# Patient Record
Sex: Female | Born: 1955 | Race: Black or African American | Hispanic: No | Smoking: Former smoker
Health system: Southern US, Community
[De-identification: ages and names within clinical notes are randomized; demographics above are authoritative.]

## PROBLEM LIST (undated history)

## (undated) DIAGNOSIS — E119 Type 2 diabetes mellitus without complications: Secondary | ICD-10-CM

## (undated) DIAGNOSIS — I10 Essential (primary) hypertension: Secondary | ICD-10-CM

## (undated) DIAGNOSIS — K529 Noninfective gastroenteritis and colitis, unspecified: Secondary | ICD-10-CM

## (undated) DIAGNOSIS — M1990 Unspecified osteoarthritis, unspecified site: Secondary | ICD-10-CM

## (undated) DIAGNOSIS — F419 Anxiety disorder, unspecified: Secondary | ICD-10-CM

## (undated) DIAGNOSIS — E785 Hyperlipidemia, unspecified: Secondary | ICD-10-CM

## (undated) DIAGNOSIS — T7840XA Allergy, unspecified, initial encounter: Secondary | ICD-10-CM

## (undated) HISTORY — DX: Unspecified osteoarthritis, unspecified site: M19.90

## (undated) HISTORY — DX: Anxiety disorder, unspecified: F41.9

## (undated) HISTORY — PX: FOOT SURGERY: SHX648

## (undated) HISTORY — DX: Noninfective gastroenteritis and colitis, unspecified: K52.9

## (undated) HISTORY — DX: Hyperlipidemia, unspecified: E78.5

## (undated) HISTORY — DX: Essential (primary) hypertension: I10

## (undated) HISTORY — DX: Allergy, unspecified, initial encounter: T78.40XA

## (undated) HISTORY — DX: Type 2 diabetes mellitus without complications: E11.9

---

## 1978-07-17 HISTORY — PX: KNEE ARTHROSCOPY: SUR90

## 1998-06-09 ENCOUNTER — Other Ambulatory Visit: Admission: RE | Admit: 1998-06-09 | Discharge: 1998-06-09 | Payer: Self-pay | Admitting: *Deleted

## 1998-08-20 ENCOUNTER — Encounter: Admission: RE | Admit: 1998-08-20 | Discharge: 1998-11-18 | Payer: Self-pay | Admitting: Internal Medicine

## 1999-07-18 HISTORY — PX: CERVIX SURGERY: SHX593

## 2000-05-30 ENCOUNTER — Other Ambulatory Visit: Admission: RE | Admit: 2000-05-30 | Discharge: 2000-05-30 | Payer: Self-pay | Admitting: *Deleted

## 2004-08-22 ENCOUNTER — Encounter: Payer: Self-pay | Admitting: Internal Medicine

## 2004-08-23 ENCOUNTER — Ambulatory Visit: Payer: Self-pay | Admitting: Internal Medicine

## 2004-09-28 ENCOUNTER — Ambulatory Visit: Payer: Self-pay | Admitting: Internal Medicine

## 2004-10-03 ENCOUNTER — Ambulatory Visit: Payer: Self-pay | Admitting: Internal Medicine

## 2004-10-06 ENCOUNTER — Ambulatory Visit: Payer: Self-pay | Admitting: Internal Medicine

## 2004-10-11 ENCOUNTER — Ambulatory Visit: Payer: Self-pay | Admitting: Internal Medicine

## 2004-12-14 ENCOUNTER — Ambulatory Visit: Payer: Self-pay | Admitting: Internal Medicine

## 2005-01-24 ENCOUNTER — Ambulatory Visit: Payer: Self-pay | Admitting: Internal Medicine

## 2005-03-13 ENCOUNTER — Ambulatory Visit: Payer: Self-pay | Admitting: Internal Medicine

## 2005-04-05 ENCOUNTER — Ambulatory Visit: Payer: Self-pay | Admitting: Internal Medicine

## 2005-07-17 HISTORY — PX: LAMINECTOMY: SHX219

## 2005-10-02 ENCOUNTER — Ambulatory Visit (HOSPITAL_COMMUNITY): Admission: RE | Admit: 2005-10-02 | Discharge: 2005-10-04 | Payer: Self-pay | Admitting: Orthopedic Surgery

## 2005-11-16 ENCOUNTER — Ambulatory Visit: Payer: Self-pay | Admitting: Internal Medicine

## 2006-03-01 ENCOUNTER — Encounter: Payer: Self-pay | Admitting: Vascular Surgery

## 2006-03-01 ENCOUNTER — Ambulatory Visit (HOSPITAL_COMMUNITY): Admission: RE | Admit: 2006-03-01 | Discharge: 2006-03-01 | Payer: Self-pay | Admitting: Orthopedic Surgery

## 2006-03-28 ENCOUNTER — Ambulatory Visit (HOSPITAL_COMMUNITY): Admission: RE | Admit: 2006-03-28 | Discharge: 2006-03-29 | Payer: Self-pay | Admitting: Orthopedic Surgery

## 2006-03-28 ENCOUNTER — Encounter (INDEPENDENT_AMBULATORY_CARE_PROVIDER_SITE_OTHER): Payer: Self-pay | Admitting: Specialist

## 2006-05-08 LAB — HM MAMMOGRAPHY: HM Mammogram: NORMAL

## 2006-06-29 ENCOUNTER — Ambulatory Visit (HOSPITAL_COMMUNITY): Admission: RE | Admit: 2006-06-29 | Discharge: 2006-06-29 | Payer: Self-pay | Admitting: Obstetrics and Gynecology

## 2006-06-29 ENCOUNTER — Encounter (INDEPENDENT_AMBULATORY_CARE_PROVIDER_SITE_OTHER): Payer: Self-pay | Admitting: Specialist

## 2006-11-21 DIAGNOSIS — E1151 Type 2 diabetes mellitus with diabetic peripheral angiopathy without gangrene: Secondary | ICD-10-CM

## 2006-11-21 DIAGNOSIS — E1169 Type 2 diabetes mellitus with other specified complication: Secondary | ICD-10-CM | POA: Insufficient documentation

## 2006-11-21 DIAGNOSIS — E785 Hyperlipidemia, unspecified: Secondary | ICD-10-CM

## 2006-11-21 DIAGNOSIS — K5289 Other specified noninfective gastroenteritis and colitis: Secondary | ICD-10-CM | POA: Insufficient documentation

## 2006-11-21 DIAGNOSIS — N1832 Chronic kidney disease, stage 3b: Secondary | ICD-10-CM | POA: Insufficient documentation

## 2006-11-22 ENCOUNTER — Ambulatory Visit: Payer: Self-pay | Admitting: Internal Medicine

## 2006-11-22 LAB — CONVERTED CEMR LAB
BUN: 17 mg/dL (ref 6–23)
Basophils Absolute: 0 10*3/uL (ref 0.0–0.1)
Bilirubin, Direct: 0.1 mg/dL (ref 0.0–0.3)
Calcium: 9.4 mg/dL (ref 8.4–10.5)
Chloride: 108 meq/L (ref 96–112)
Creatinine,U: 98.7 mg/dL
Eosinophils Relative: 3 % (ref 0.0–5.0)
GFR calc Af Amer: 85 mL/min
GFR calc non Af Amer: 70 mL/min
Glucose, Bld: 79 mg/dL (ref 70–99)
HCT: 41.4 % (ref 36.0–46.0)
HDL: 33.4 mg/dL — ABNORMAL LOW (ref >39.0)
Hgb A1c MFr Bld: 4.9 % (ref 4.6–6.0)
Lymphocytes Relative: 27.2 % (ref 12.0–46.0)
MCHC: 33.8 g/dL (ref 30.0–36.0)
Microalb Creat Ratio: 3 mg/g (ref 0.0–30.0)
Monocytes Absolute: 0.6 10*3/uL (ref 0.2–0.7)
Neutro Abs: 5.9 10*3/uL (ref 1.4–7.7)
Neutrophils Relative %: 63.5 % (ref 43.0–77.0)
Sodium: 141 meq/L (ref 135–145)
Total CHOL/HDL Ratio: 4.3
Total Protein: 7.7 g/dL (ref 6.0–8.3)
Triglycerides: 69 mg/dL (ref 0–149)

## 2007-01-15 ENCOUNTER — Encounter: Payer: Self-pay | Admitting: Internal Medicine

## 2007-03-23 IMAGING — CR DG LUMBAR SPINE 2-3V
2 series · 2 of 2 positions shown · non-contrast
Comparison: none

CLINICAL DATA: Back pain.
 LUMBAR SPINE ? 1 VIEW:

[view not recorded (1 of 2)]
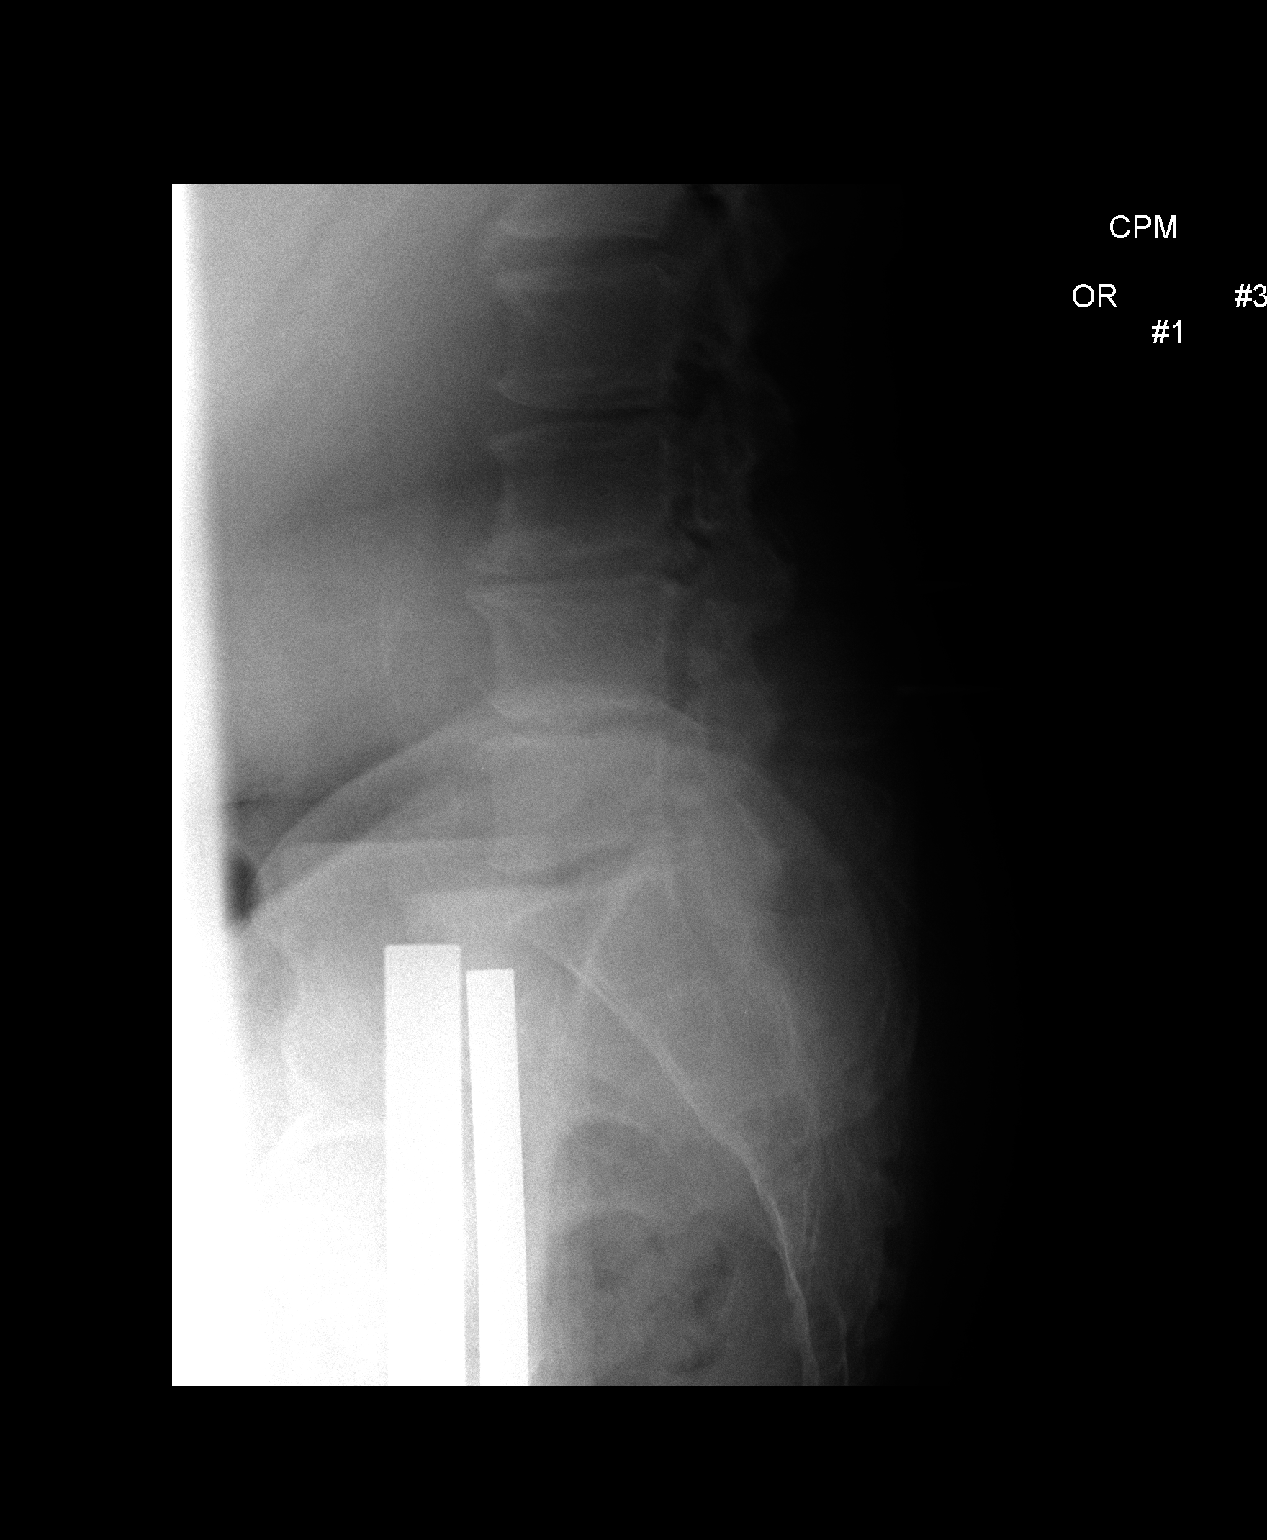

[view not recorded (2 of 2)]
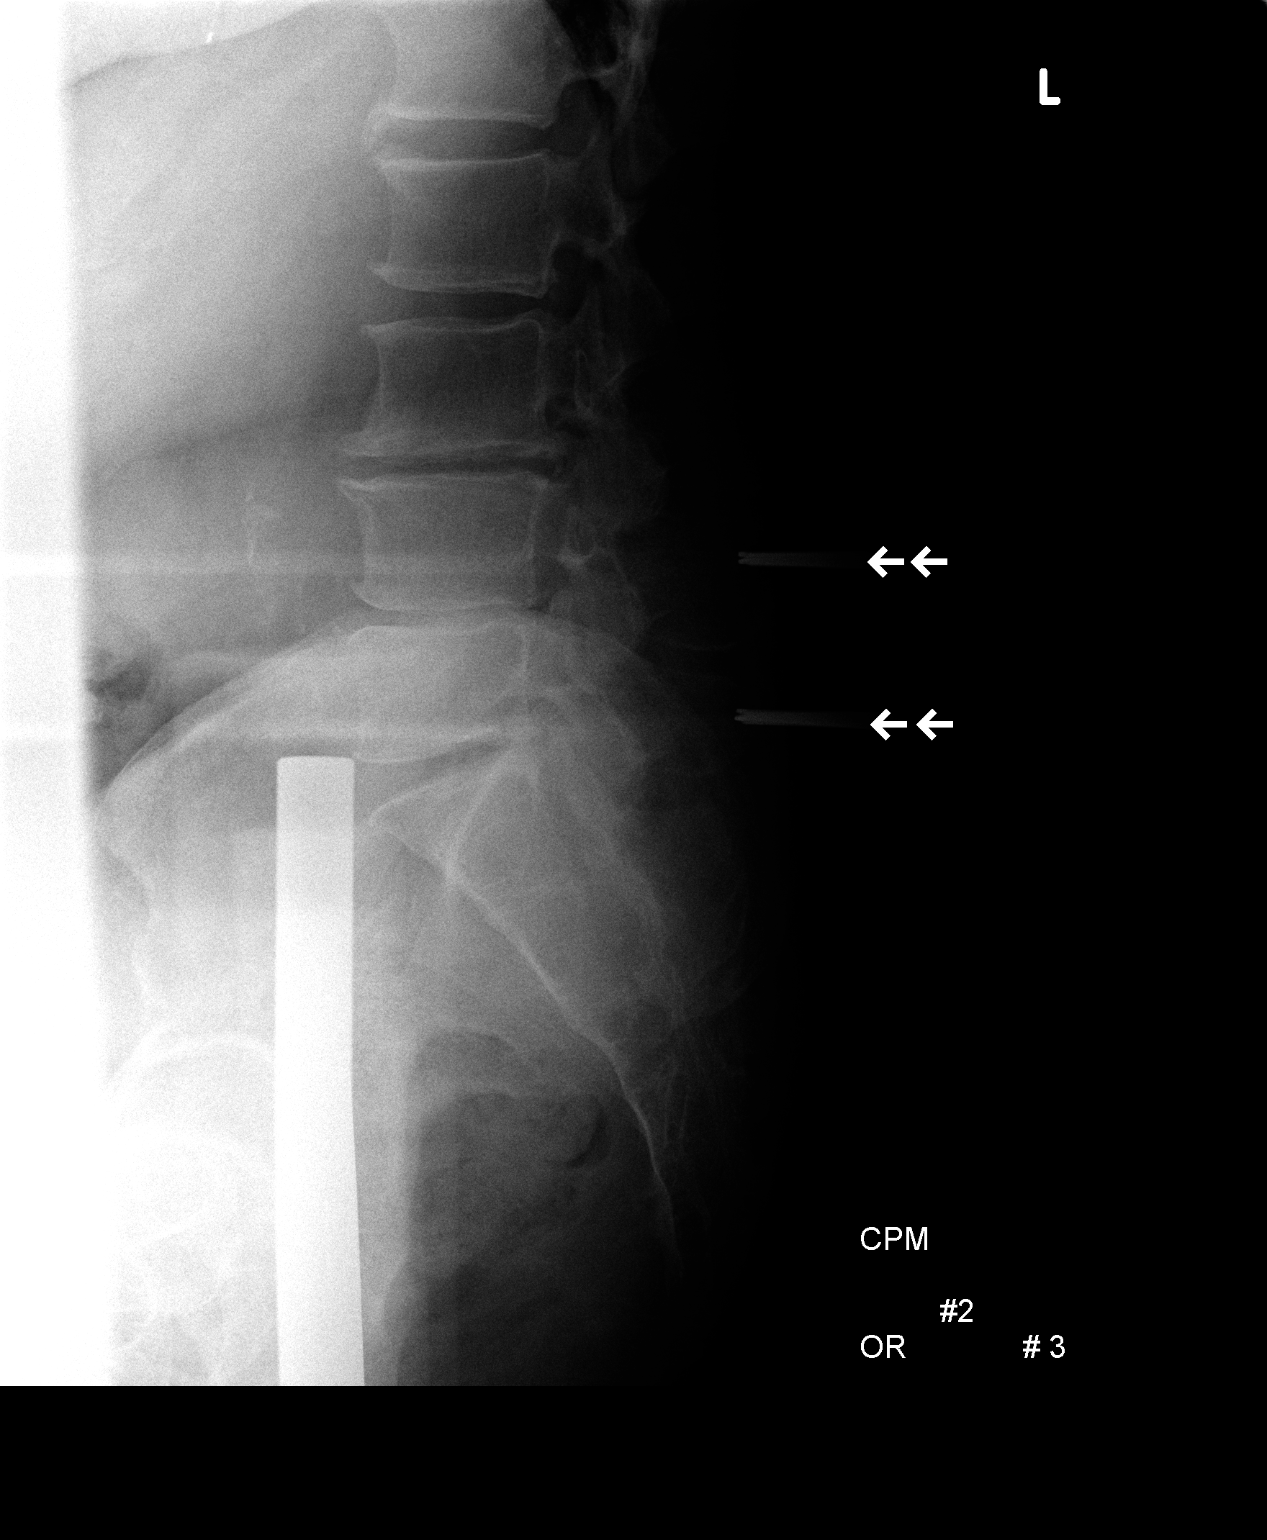

[2 of 2 positions shown; findings below may reference images not displayed]

FINDINGS: Intraoperative lateral film #1 at [DATE] hours demonstrates multilevel degenerative change.  No definite instrument is seen in the same field.
IMPRESSION: As discussed above.
 LUMBAR SPINE ? 1 VIEW:
FINDINGS: Film #2 at [DATE] hours demonstrates a probe likely representing a towel clamp directed at the L-4 and L-5 spinous processes.
IMPRESSION: L-4 and L-5 marked.

## 2007-06-14 ENCOUNTER — Encounter: Payer: Self-pay | Admitting: Internal Medicine

## 2007-06-21 ENCOUNTER — Telehealth (INDEPENDENT_AMBULATORY_CARE_PROVIDER_SITE_OTHER): Payer: Self-pay | Admitting: *Deleted

## 2007-07-18 HISTORY — PX: COLONOSCOPY: SHX174

## 2007-09-18 ENCOUNTER — Encounter: Payer: Self-pay | Admitting: Internal Medicine

## 2007-09-24 ENCOUNTER — Ambulatory Visit: Payer: Self-pay | Admitting: Internal Medicine

## 2007-09-27 ENCOUNTER — Telehealth (INDEPENDENT_AMBULATORY_CARE_PROVIDER_SITE_OTHER): Payer: Self-pay | Admitting: *Deleted

## 2007-10-04 ENCOUNTER — Encounter: Payer: Self-pay | Admitting: Internal Medicine

## 2007-10-04 ENCOUNTER — Ambulatory Visit: Payer: Self-pay | Admitting: Internal Medicine

## 2007-10-04 LAB — HM COLONOSCOPY: HM Colonoscopy: NEGATIVE

## 2008-01-20 ENCOUNTER — Telehealth (INDEPENDENT_AMBULATORY_CARE_PROVIDER_SITE_OTHER): Payer: Self-pay | Admitting: *Deleted

## 2008-01-22 ENCOUNTER — Telehealth (INDEPENDENT_AMBULATORY_CARE_PROVIDER_SITE_OTHER): Payer: Self-pay | Admitting: *Deleted

## 2008-01-27 ENCOUNTER — Telehealth (INDEPENDENT_AMBULATORY_CARE_PROVIDER_SITE_OTHER): Payer: Self-pay | Admitting: *Deleted

## 2008-04-22 ENCOUNTER — Ambulatory Visit: Payer: Self-pay | Admitting: Internal Medicine

## 2008-04-22 DIAGNOSIS — R9431 Abnormal electrocardiogram [ECG] [EKG]: Secondary | ICD-10-CM

## 2008-04-23 ENCOUNTER — Telehealth (INDEPENDENT_AMBULATORY_CARE_PROVIDER_SITE_OTHER): Payer: Self-pay | Admitting: *Deleted

## 2008-04-24 ENCOUNTER — Encounter (INDEPENDENT_AMBULATORY_CARE_PROVIDER_SITE_OTHER): Payer: Self-pay | Admitting: *Deleted

## 2008-04-30 ENCOUNTER — Encounter: Payer: Self-pay | Admitting: Internal Medicine

## 2008-04-30 ENCOUNTER — Telehealth (INDEPENDENT_AMBULATORY_CARE_PROVIDER_SITE_OTHER): Payer: Self-pay | Admitting: *Deleted

## 2008-04-30 ENCOUNTER — Encounter (INDEPENDENT_AMBULATORY_CARE_PROVIDER_SITE_OTHER): Payer: Self-pay | Admitting: *Deleted

## 2008-04-30 ENCOUNTER — Ambulatory Visit: Payer: Self-pay

## 2008-05-08 ENCOUNTER — Telehealth (INDEPENDENT_AMBULATORY_CARE_PROVIDER_SITE_OTHER): Payer: Self-pay | Admitting: *Deleted

## 2008-05-11 ENCOUNTER — Telehealth (INDEPENDENT_AMBULATORY_CARE_PROVIDER_SITE_OTHER): Payer: Self-pay | Admitting: *Deleted

## 2008-05-25 ENCOUNTER — Encounter: Payer: Self-pay | Admitting: Internal Medicine

## 2008-06-22 ENCOUNTER — Telehealth (INDEPENDENT_AMBULATORY_CARE_PROVIDER_SITE_OTHER): Payer: Self-pay | Admitting: *Deleted

## 2008-06-24 ENCOUNTER — Ambulatory Visit: Payer: Self-pay | Admitting: Internal Medicine

## 2008-06-25 ENCOUNTER — Encounter (INDEPENDENT_AMBULATORY_CARE_PROVIDER_SITE_OTHER): Payer: Self-pay | Admitting: *Deleted

## 2008-06-25 LAB — CONVERTED CEMR LAB: Hgb A1c MFr Bld: 6.5 % — ABNORMAL HIGH (ref 4.6–6.0)

## 2008-09-15 ENCOUNTER — Encounter (INDEPENDENT_AMBULATORY_CARE_PROVIDER_SITE_OTHER): Payer: Self-pay | Admitting: *Deleted

## 2008-09-24 ENCOUNTER — Telehealth (INDEPENDENT_AMBULATORY_CARE_PROVIDER_SITE_OTHER): Payer: Self-pay | Admitting: *Deleted

## 2008-10-06 ENCOUNTER — Encounter: Payer: Self-pay | Admitting: Internal Medicine

## 2008-12-09 ENCOUNTER — Ambulatory Visit: Payer: Self-pay | Admitting: Internal Medicine

## 2008-12-09 DIAGNOSIS — R1013 Epigastric pain: Secondary | ICD-10-CM

## 2008-12-15 ENCOUNTER — Encounter (INDEPENDENT_AMBULATORY_CARE_PROVIDER_SITE_OTHER): Payer: Self-pay | Admitting: *Deleted

## 2008-12-15 LAB — CONVERTED CEMR LAB
ALT: 22 units/L (ref 0–35)
Albumin: 4 g/dL (ref 3.5–5.2)
Basophils Absolute: 0 10*3/uL (ref 0.0–0.1)
Basophils Relative: 0.1 % (ref 0.0–3.0)
H Pylori IgG: NEGATIVE
HCT: 43.5 % (ref 36.0–46.0)
Hemoglobin: 14.8 g/dL (ref 12.0–15.0)
MCHC: 34.1 g/dL (ref 30.0–36.0)
Monocytes Absolute: 0.6 10*3/uL (ref 0.1–1.0)
Monocytes Relative: 4.8 % (ref 3.0–12.0)
Neutrophils Relative %: 68.9 % (ref 43.0–77.0)
RBC: 4.64 M/uL (ref 3.87–5.11)
Total Bilirubin: 0.9 mg/dL (ref 0.3–1.2)
Total Protein: 7.8 g/dL (ref 6.0–8.3)
WBC: 11.5 10*3/uL — ABNORMAL HIGH (ref 4.5–10.5)

## 2008-12-21 ENCOUNTER — Encounter (INDEPENDENT_AMBULATORY_CARE_PROVIDER_SITE_OTHER): Payer: Self-pay | Admitting: *Deleted

## 2008-12-21 ENCOUNTER — Ambulatory Visit: Payer: Self-pay | Admitting: Internal Medicine

## 2008-12-21 LAB — CONVERTED CEMR LAB
OCCULT 1: NEGATIVE
OCCULT 2: NEGATIVE
OCCULT 3: NEGATIVE

## 2009-01-12 ENCOUNTER — Telehealth (INDEPENDENT_AMBULATORY_CARE_PROVIDER_SITE_OTHER): Payer: Self-pay | Admitting: *Deleted

## 2009-02-08 ENCOUNTER — Ambulatory Visit: Payer: Self-pay | Admitting: Internal Medicine

## 2009-02-12 ENCOUNTER — Ambulatory Visit (HOSPITAL_COMMUNITY): Admission: RE | Admit: 2009-02-12 | Discharge: 2009-02-12 | Payer: Self-pay | Admitting: Internal Medicine

## 2009-02-14 ENCOUNTER — Encounter: Payer: Self-pay | Admitting: Internal Medicine

## 2009-03-08 ENCOUNTER — Telehealth (INDEPENDENT_AMBULATORY_CARE_PROVIDER_SITE_OTHER): Payer: Self-pay | Admitting: *Deleted

## 2009-05-12 ENCOUNTER — Ambulatory Visit: Payer: Self-pay | Admitting: Internal Medicine

## 2009-06-29 ENCOUNTER — Telehealth (INDEPENDENT_AMBULATORY_CARE_PROVIDER_SITE_OTHER): Payer: Self-pay | Admitting: *Deleted

## 2009-06-30 ENCOUNTER — Telehealth (INDEPENDENT_AMBULATORY_CARE_PROVIDER_SITE_OTHER): Payer: Self-pay | Admitting: *Deleted

## 2009-08-30 ENCOUNTER — Encounter (INDEPENDENT_AMBULATORY_CARE_PROVIDER_SITE_OTHER): Payer: Self-pay | Admitting: *Deleted

## 2009-12-08 ENCOUNTER — Telehealth (INDEPENDENT_AMBULATORY_CARE_PROVIDER_SITE_OTHER): Payer: Self-pay | Admitting: *Deleted

## 2010-02-14 ENCOUNTER — Ambulatory Visit: Payer: Self-pay | Admitting: Internal Medicine

## 2010-02-14 DIAGNOSIS — I152 Hypertension secondary to endocrine disorders: Secondary | ICD-10-CM | POA: Insufficient documentation

## 2010-02-14 DIAGNOSIS — I10 Essential (primary) hypertension: Secondary | ICD-10-CM | POA: Insufficient documentation

## 2010-04-18 ENCOUNTER — Telehealth (INDEPENDENT_AMBULATORY_CARE_PROVIDER_SITE_OTHER): Payer: Self-pay | Admitting: *Deleted

## 2010-05-23 ENCOUNTER — Telehealth (INDEPENDENT_AMBULATORY_CARE_PROVIDER_SITE_OTHER): Payer: Self-pay | Admitting: *Deleted

## 2010-06-01 ENCOUNTER — Ambulatory Visit: Payer: Self-pay | Admitting: Internal Medicine

## 2010-06-06 LAB — CONVERTED CEMR LAB
Cholesterol: 193 mg/dL (ref 0–200)
HDL: 40.7 mg/dL (ref >39.00)
Triglycerides: 70 mg/dL (ref 0.0–149.0)

## 2010-07-04 ENCOUNTER — Ambulatory Visit: Payer: Self-pay | Admitting: Internal Medicine

## 2010-08-14 LAB — CONVERTED CEMR LAB
ALT: 24 units/L (ref 0–35)
ALT: 55 units/L — ABNORMAL HIGH (ref 0–35)
AST: 33 units/L (ref 0–37)
AST: 74 units/L — ABNORMAL HIGH (ref 0–37)
Albumin: 4.3 g/dL (ref 3.5–5.2)
Alkaline Phosphatase: 52 units/L (ref 39–117)
Basophils Absolute: 0 10*3/uL (ref 0.0–0.1)
Basophils Absolute: 0.1 10*3/uL (ref 0.0–0.1)
Basophils Relative: 0.6 % (ref 0.0–3.0)
Bilirubin, Direct: 0.1 mg/dL (ref 0.0–0.3)
Bilirubin, Direct: 0.2 mg/dL (ref 0.0–0.3)
CO2: 26 meq/L (ref 19–32)
Calcium: 9.4 mg/dL (ref 8.4–10.5)
Calcium: 9.5 mg/dL (ref 8.4–10.5)
Chloride: 108 meq/L (ref 96–112)
Cholesterol: 210 mg/dL — ABNORMAL HIGH (ref 0–200)
Creatinine, Ser: 0.9 mg/dL (ref 0.4–1.2)
Creatinine, Ser: 1.1 mg/dL (ref 0.4–1.2)
Direct LDL: 149.9 mg/dL
GFR calc Af Amer: 85 mL/min
GFR calc non Af Amer: 70 mL/min
GFR calc non Af Amer: 70.2 mL/min (ref >60)
HCT: 42.4 % (ref 36.0–46.0)
HCT: 43.8 % (ref 36.0–46.0)
HDL goal, serum: 40 mg/dL
HDL: 43.7 mg/dL (ref >39.00)
Hgb A1c MFr Bld: 7.4 % — ABNORMAL HIGH (ref 4.6–6.0)
LDL Cholesterol: 111 mg/dL — ABNORMAL HIGH (ref 0–99)
Lymphocytes Relative: 26.4 % (ref 12.0–46.0)
MCHC: 34 g/dL (ref 30.0–36.0)
MCV: 93.2 fL (ref 78.0–100.0)
MCV: 94.3 fL (ref 78.0–100.0)
Microalb Creat Ratio: 0.4 mg/g (ref 0.0–30.0)
Microalb, Ur: 1 mg/dL (ref 0.0–1.9)
Monocytes Absolute: 0.6 10*3/uL (ref 0.1–1.0)
Neutro Abs: 7.4 10*3/uL (ref 1.4–7.7)
Neutrophils Relative %: 64.4 % (ref 43.0–77.0)
Neutrophils Relative %: 64.5 % (ref 43.0–77.0)
Platelets: 163 10*3/uL (ref 150–400)
RBC: 4.7 M/uL (ref 3.87–5.11)
RDW: 11.7 % (ref 11.5–14.6)
RDW: 13.2 % (ref 11.5–14.6)
TSH: 1.63 microintl units/mL (ref 0.35–5.50)
Total Bilirubin: 0.8 mg/dL (ref 0.3–1.2)
Total Bilirubin: 0.8 mg/dL (ref 0.3–1.2)
Total Protein: 7.6 g/dL (ref 6.0–8.3)
Triglycerides: 87 mg/dL (ref 0–149)
Triglycerides: 91 mg/dL (ref 0.0–149.0)
WBC: 9.8 10*3/uL (ref 4.5–10.5)

## 2010-08-18 NOTE — Assessment & Plan Note (Signed)
Summary: ov before refilling med/cbs   Vital Signs:  Patient profile:   55 year old female Weight:      255.4 pounds BMI:     38.13 Pulse rate:   80 / minute Resp:     16 per minute BP sitting:   136 / 80  (left arm) Cuff size:   large  Vitals Entered By: Georgette Dover CMA (July 04, 2010 3:22 PM) CC: Follow-up visit: discuss labs (copy given) and shingles vaccine , Type 2 diabetes mellitus follow-up   Primary Care Kinneth Fujiwara:  Unice Cobble, MD  CC:  Follow-up visit: discuss labs (copy given) and shingles vaccine  and Type 2 diabetes mellitus follow-up.  History of Present Illness:   Type 2 Diabetes Mellitus Follow-Up:   The patient denies polyuria, polydipsia, blurred vision, self managed hypoglycemia, weight loss,  or weight gain. Her  numbness of extremities has been present since spine surgery in 2007.  The patient denies the following symptoms: neuropathic pain, chest pain, vomiting, orthostatic symptoms, poor wound healing, intermittent claudication, and vision loss.  Since the last visit the patient reports good dietary compliance and exercising regularly.  The patient has been measuring capillary blood glucose before breakfast, < 139.  Since the last visit, the patient reports having had eye care by an ophthalmologist; no retinopathy present.   A1c 6.9% ( average sugar 151, risk 38%). Potential risks of Actos discussed.   Hyperlipidemia Follow-Up:    The patient denies muscle aches, GI upset, abdominal pain, flushing, itching, constipation, diarrhea, and fatigue.  The patient denies the following symptoms: dypsnea, palpitations, syncope, and pedal edema.  Compliance with medications (by patient report) has been near 100%.  Adjunctive measures currently used by the patient include ASA . LDL 138 on Simvastatin 40 mg, riisk discussed.Lipitor had cost her $50/month.  Current Medications (verified): 1)  Metoprolol Tartrate 50 Mg Tabs (Metoprolol Tartrate) .... Take 1/2 Tablet Twice A  Day. 2)  Simvastatin 40 Mg Tabs (Simvastatin) .Marland Kitchen.. 1 By Mouth Once Daily**appointment Due** 3)  Benazepril Hcl 40 Mg Tabs (Benazepril Hcl) .... Take One Tablet Daily 4)  Metformin Hcl 1000 Mg Tabs (Metformin Hcl) .Marland Kitchen.. 1 By Mouth Two Times A Day 5)  Hydrochlorothiazide 25 Mg Tabs (Hydrochlorothiazide) .... Take 1/2 Tablet Daily By Mouth 6)  Fem Hrt 1.5 .... Take As Directed 7)  Onetouch Delica Lancets  Misc (Lancets) .... Check Bloodsugar Daily Dx:250.00 8)  Freestyle Test   Strp (Glucose Blood) .... Use As Directed 9)  Actos 30 Mg Tabs (Pioglitazone Hcl) .Marland Kitchen.. 1 Once Daily 10)  Onetouch Ultra Test  Strp (Glucose Blood) .... Check Bloodsugar Once Daily Dx:250.00 11)  Aspirin 81 Mg Tabs (Aspirin) .Marland Kitchen.. 1 By Mouth Once Daily  Allergies: 1)  ! Codeine 2)  ! Septra Ds 3)  ! Neosporin  Physical Exam  General:  well-nourished;alert,appropriate and cooperative throughout examination Lungs:  Normal respiratory effort, chest expands symmetrically. Lungs are clear to auscultation, no crackles or wheezes. Heart:  Normal rate and regular rhythm. S1 and S2 normal without gallop, murmur, click, rub .S4 Pulses:  R and L carotid,radial,dorsalis pedis and posterior tibial pulses are full and equal bilaterally Extremities:  No clubbing, cyanosis, edema. Good nail health Neurologic:  alert & oriented X3 and  light touch felt as tingling R foot.   Skin:  Intact without suspicious lesions or rashes Psych:  memory intact for recent and remote, normally interactive, and good eye contact.     Impression & Recommendations:  Problem #  1:  DIABETES MELLITUS, TYPE II (ICD-250.00) A1c climbing  The following medications were removed from the medication list:    Amaryl 4 Mg Tabs (Glimepiride) .Marland Kitchen... Take 1/2 tablet once a day by mouth (held due to a1c 5.8%)    Metformin Hcl 1000 Mg Tabs (Metformin hcl) .Marland Kitchen... 1 by mouth two times a day    Actos 30 Mg Tabs (Pioglitazone hcl) .Marland Kitchen... 1 once daily Her updated  medication list for this problem includes:    Benazepril Hcl 40 Mg Tabs (Benazepril hcl) .Marland Kitchen... Take one tablet daily    Aspirin 81 Mg Tabs (Aspirin) .Marland Kitchen... 1 by mouth once daily    Janumet 50-1000 Mg Tabs (Sitagliptin-metformin hcl) .Marland Kitchen... 1 two times a day with meals  Problem # 2:  HYPERLIPIDEMIA (ICD-272.4)  Her updated medication list for this problem includes:    Simvastatin 40 Mg Tabs (Simvastatin) .Marland Kitchen... 1 by mouth once daily  Problem # 3:  HYPERTENSION (ICD-401.9) good control Her updated medication list for this problem includes:    Metoprolol Tartrate 50 Mg Tabs (Metoprolol tartrate) .Marland Kitchen... Take 1/2 tablet twice a day.    Benazepril Hcl 40 Mg Tabs (Benazepril hcl) .Marland Kitchen... Take one tablet daily    Hydrochlorothiazide 25 Mg Tabs (Hydrochlorothiazide) .Marland Kitchen... Take 1/2 tablet daily by mouth  Complete Medication List: 1)  Metoprolol Tartrate 50 Mg Tabs (Metoprolol tartrate) .... Take 1/2 tablet twice a day. 2)  Benazepril Hcl 40 Mg Tabs (Benazepril hcl) .... Take one tablet daily 3)  Hydrochlorothiazide 25 Mg Tabs (Hydrochlorothiazide) .... Take 1/2 tablet daily by mouth 4)  Fem Hrt 1.5  .... Take as directed 5)  Onetouch Delica Lancets Misc (Lancets) .... Check bloodsugar daily dx:250.00 6)  Freestyle Test Strp (Glucose blood) .... Use as directed 7)  Onetouch Ultra Test Strp (Glucose blood) .... Check bloodsugar once daily dx:250.00 8)  Aspirin 81 Mg Tabs (Aspirin) .Marland Kitchen.. 1 by mouth once daily 9)  Janumet 50-1000 Mg Tabs (Sitagliptin-metformin hcl) .Marland Kitchen.. 1 two times a day with meals 10)  Lipitor 40 Mg Tabs (Atorvastatin calcium) .Marland Kitchen.. 1 once daily  Other Orders: Zoster (Shingles) Vaccine Live [phone]) Admin 1st Vaccine [phone])  Patient Instructions: 1)  Please schedule a follow-up appointment in 4 months. 2)  Check your blood sugars regularly. If your readings are usually above : 150 or below 90 you should contact our office. 3)  See your eye doctor yearly to check for diabetic eye  damage. 4)  Check your feet each night for sore areas, calluses or signs of infection. 5)  BUN,creat, K+; 6)  Hepatic Panel ; 7)  Lipid Panel ; 8)  HbgA1C ; 9)  Urine Microalbumin. 10)  Check your Blood Pressure regularly. If it is above: 135/85 ON AVERAGE  you should make an appointment. Prescriptions: LIPITOR 40 MG TABS (ATORVASTATIN CALCIUM) 1 once daily  #30 x 5   Entered and Authorized by:   Unice Cobble MD   Signed by:   Unice Cobble MD on 07/04/2010   Method used:   Print then Give to Patient   RxID:   [id number] JANUMET 50-1000 MG TABS (SITAGLIPTIN-METFORMIN HCL) 1 two times a day with meals  #60 x 5   Entered and Authorized by:   Unice Cobble MD   Signed by:   Unice Cobble MD on 07/04/2010   Method used:   Print then Give to Patient   RxID:   [id number]    Orders Added: 1)  Zoster (Shingles) Vaccine Live [41324] 2)  Admin  1st Vaccine [90471] 3)  Est. Patient Level IV [69249]   Immunizations Administered:  Zostavax # 1:    Vaccine Type: Zostavax    Site: Left Arm    Mfr: Merck    Dose: 0.66m    Route: [place]    Given by: CGeorgette DoverCMA    Exp. Date: 05/22/2011    Lot #: [id number]   VIS given: 04/28/05 given July 04, 2010.   Immunizations Administered:  Zostavax # 1:    Vaccine Type: Zostavax    Site: Left Arm    Mfr: Merck    Dose: 0.680m   Route: [place]    Given by: ChGeorgette DoverMA    Exp. Date: 05/22/2011    Lot #: [id number]  VIS given: 04/28/05 given July 04, 2010.

## 2010-08-18 NOTE — Letter (Signed)
Summary: Primary Care Appointment Letter  [facility] [place]   [place], [place] [address]   Phone: [phone]  Fax: [phone]    08/30/2009 MRN: [id number]     [facility] [address] [place], [place]  [address]  Dear Ms. Lachney,   Your Primary Care Physician Unice Cobble MD has indicated that:    __x_____it is time to schedule an appointment. lab work due cholesterol check    _______you missed your appointment on______ and need to call and          reschedule.    _______you need to have lab work done.    _______you need to schedule an appointment discuss lab or test results.    _______you need to call to reschedule your appointment that is                       scheduled on _________.     Please call our office as soon as possible. Our phone number is [phone]________. Please press option 1. Our office is open 8a-12noon and 1p-5p, Monday through Friday.     Thank you,    Sharon

## 2010-08-18 NOTE — Progress Notes (Signed)
Summary: simvastatin refill  Phone Note Refill Request Message from:  Fax from Pharmacy on May 23, 2010 3:53 PM  Refills Requested: Medication #1:  SIMVASTATIN 40 MG TABS 1 by mouth once daily Norvel Richards [place], [place]      Initial call taken by: Berneta Sages,  May 23, 2010 4:58 PM  Follow-up for Phone Call        Copied from 02/14/2010 lab append:  Recheck A1c in 12 weeks with fasting lipids (A1c, lipids; 250.00, 272.4).[place]opp    Follow-up by: Georgette Dover CMA,  May 24, 2010 10:23 AM    New/Updated Medications: SIMVASTATIN 40 MG TABS (SIMVASTATIN) 1 by mouth once daily**APPOINTMENT DUE** Prescriptions: SIMVASTATIN 40 MG TABS (SIMVASTATIN) 1 by mouth once daily**APPOINTMENT DUE**  #90 x 0   Entered by:   [place] by:   Unice Cobble MD   Signed by:   Georgette Dover CMA on 05/24/2010   Method used:   Electronically to        [facility] (retail)       [address]       [place], [place]  [address]       Ph: [phone] or [phone]       Fax: [phone]   RxID:   [id number]

## 2010-08-18 NOTE — Letter (Signed)
Summary: Proof of Physical Form/[facility]  Proof of Physical Form/[facility]   Imported By: Phillis Knack 03/24/2010 13:17:03  _____________________________________________________________________  External Attachment:    Type:   Image     Comment:   External Document

## 2010-08-18 NOTE — Letter (Signed)
Summary: Cancer Screening/[facility] Personalized Risk Profile  Cancer Screening/[facility] Personal[facility] Risk Profile   Imported By: Edmonia James 02/21/2010 11:14:09  _____________________________________________________________________  External Attachment:    Type:   Image     Comment:   External Document

## 2010-08-18 NOTE — Progress Notes (Signed)
Summary: Medication Concerns  Phone Note Call from Patient   Caller: Patient Summary of Call: Left message with patient's brother to have patient return call when avaliable. Reason for call-we received a refill for Simvastatin but on med list we have Vytorin.    [name]  Dec 08, 2009 1:24 PM   Follow-up for Phone Call        Spoke with patient on work number, patient said her mom was here for an OV and mentioned the cost of Vytorin for her and informed her to change to Simvastatin.   Patient then mentioned that she needs a New Meter to test her bloodsugar.  I sent in RX for Strips and Lancets, placed machine at the front for pick-up Follow-up by: Georgette Dover,  Dec 08, 2009 4:35 PM    New/Updated Medications: SIMVASTATIN 40 MG TABS (SIMVASTATIN) 1 by mouth once daily ONETOUCH DELICA LANCETS  MISC (LANCETS) check bloodsugar daily DX:250.00 ONETOUCH ULTRA TEST  STRP (GLUCOSE BLOOD) Check Bloodsugar once daily DX:250.00 Prescriptions: ONETOUCH ULTRA TEST  STRP (GLUCOSE BLOOD) Check Bloodsugar once daily DX:250.00  #100 x 3   Entered by:   Georgette Dover   Authorized by:   Unice Cobble MD   Signed by:   Georgette Dover on 12/08/2009   Method used:   Electronically to        [facility]. [phone]* (retail)       [address]       [place], [place]  [address]       Ph: [phone]       Fax: [phone]   RxID:   [id number] Jonetta Speak LANCETS  MISC (LANCETS) check bloodsugar daily DX:250.00  #100 x 3   Entered by:   Georgette Dover   Authorized by:   Unice Cobble MD   Signed by:   Georgette Dover on 12/08/2009   Method used:   Electronically to        [facility]. [phone]* (retail)       [address]       [place], [place]  [address]       Ph: [phone]       Fax: [phone]   RxID:   [id number] SIMVASTATIN 40 MG TABS (SIMVASTATIN) 1 by mouth once daily  #90 x 0   Entered by:    Georgette Dover   Authorized by:   Unice Cobble MD   Signed by:   Georgette Dover on 12/08/2009   Method used:   Electronically to        [facility]. [phone]* (retail)       [address].       [place], [place]  [address]       Ph: [phone]       Fax: [phone]   RxID:   [id number]

## 2010-08-18 NOTE — Progress Notes (Signed)
Summary: refill  Phone Note Refill Request Call back at Work Phone [phone]   Refills Requested: Medication #1:  METFORMIN HCL 500 MG TABS 2tabs bid Pt left VM that she would like to [facility].  Pt states  that [facility] currently has free metformin. The rx will need to be for the  metformin 1000 mg tab because this is the free strength. pls send rx to [facility] [phone].Pls call with any concerns...........Marland KitchenFelecia Deloach CMA  April 18, 2010 12:33 PM  1000 mg tab     New/Updated Medications: METFORMIN HCL 1000 MG TABS (METFORMIN HCL) 1 by mouth two times a day Prescriptions: METFORMIN HCL 1000 MG TABS (METFORMIN HCL) 1 by mouth two times a day  #180 x 1   Entered by:   [place] by:   Unice Cobble MD   Signed by:   Georgette Dover CMA on 04/18/2010   Method used:   Electronically to        [facility] (retail)       [address].       [place], [place]  [address]       Ph: [phone] or [phone]       Fax: [phone]   RxID:   [id number]

## 2010-08-18 NOTE — Assessment & Plan Note (Signed)
Summary: cpx & lab/cbs   Vital Signs:  Patient profile:   55 year old female Height:      68.75 inches Weight:      252.2 pounds BMI:     37.65 Temp:     98.6 degrees F oral Pulse rate:   97 / minute Resp:     14 per minute BP sitting:   159 / 90  (left arm) Cuff size:   large  Vitals Entered By: Georgette Dover CMA (February 14, 2010 8:36 AM)    Primary Care Provider:  Unice Cobble, MD   History of Present Illness: [name] is here for a physical; she is asymptomatic. Hyperlipidemia Follow-Up      This is a 55 year old woman who presents for Hyperlipidemia follow-up.  The patient reports occasional  muscle aches in legs & back, but denies GI upset, abdominal pain, flushing, itching, constipation, diarrhea, and fatigue.  Other symptoms include chronic LLE  pedal edema, worse in Summer.  The patient denies the following symptoms: chest pain/pressure, exercise intolerance, dypsnea, palpitations, and syncope.  Compliance with medications (by patient report) has been near 100%.  Dietary compliance has been good; now on Weight Watchers.  The patient reports exercising 3X per week as walking.   Hypertension Follow-Up      The patient also presents for Hypertension follow-up.  The patient denies lightheadedness, urinary frequency, headaches, and rash.  Compliance with medications (by patient report) has been near 100%.BP has been well controlled @ drug store. Repeat BP 150/85. She BP meds 02/13/2010 . Type 2 Diabetes Mellitus Follow-Up      The patient is also here for Type 2 diabetes mellitus follow-up.  The patient reports single  self managed hypoglycemia ( 66 pre eve meal; afternoon snack missed) and 10 #  weight loss, but denies polyuria, polydipsia, blurred vision, and numbness of extremities.  The patient denies the following symptoms: neuropathic pain, vomiting, orthostatic symptoms, poor wound healing, intermittent claudication, vision loss, and foot ulcer.  The patient has been measuring  capillary blood glucose before breakfast (90-100) and  2 hrs after lunch ( < 16)).  Since the last visit, the patient reports having had eye care by an Ophthalmologist( no retinopathy)  but  no foot care.    Preventive Screening-Counseling & Management  Alcohol-Tobacco     Smoking Status: quit  Current Medications (verified): 1)  Metoprolol Tartrate 50 Mg Tabs (Metoprolol Tartrate) .... Take 1/2 Tablet Twice A Day. 2)  Simvastatin 40 Mg Tabs (Simvastatin) .Marland Kitchen.. 1 By Mouth Once Daily 3)  Benazepril Hcl 40 Mg Tabs (Benazepril Hcl) .... Take One Tablet Daily 4)  Amaryl 4 Mg Tabs (Glimepiride) .... Take 1/2 Tablet Once A Day By Mouth 5)  Metformin Hcl 500 Mg Tabs (Metformin Hcl) .... 2tabs Bid 6)  Hydrochlorothiazide 25 Mg Tabs (Hydrochlorothiazide) .... Take 1/2 Tablet Daily By Mouth 7)  Fem Hrt 1.5 .... Take As Directed 8)  Onetouch Delica Lancets  Misc (Lancets) .... Check Bloodsugar Daily Dx:250.00 9)  Freestyle Test   Strp (Glucose Blood) .... Use As Directed 10)  Actos 30 Mg Tabs (Pioglitazone Hcl) .Marland Kitchen.. 1 Once Daily 11)  Onetouch Ultra Test  Strp (Glucose Blood) .... Check Bloodsugar Once Daily Dx:250.00  Allergies: 1)  ! Codeine 2)  ! Septra Ds 3)  ! Neosporin 4)  ! Lipitor  Past History:  Past Medical History: Diabetes mellitus, type II Hyperlipidemia COLITIS, PMH of , 1980s, [place] Hypertension  Past Surgical History: ARTHROSCOPIC  KNEE SURGERY 1980-right FOOT SURGERY X2 IN MID 80'S G1 P1( C section) CRYOSURGERY of CERVIX 2003 LAMINECTOMY, BACK SURGERY X2 in 2007 UTERINE POLYP 12/07, Dr Philis Pique ; Colonoscopy 2009 negative , Dr Olevia Perches (Note : no PMH of Endoscopy) D & C  Family History: Father: DM, HTN,MI @ 25 in context of  GASTRIC BLEED MOTHER:DM,HTN, CAD, 45 MGM:DM, CVA PGF:MI DIED in  59'S MGF:MI DIED  @  59 No FH of Colon Cancer:  Social History: Occupation: Soil scientist Single Alcohol use-no Former Smoker( college  only)  Review of Systems  The patient denies anorexia, fever, decreased hearing, hoarseness, prolonged cough, headaches, hemoptysis, abdominal pain, melena, hematochezia, severe indigestion/heartburn, hematuria, incontinence, suspicious skin lesions, depression, unusual weight change, abnormal bleeding, enlarged lymph nodes, and angioedema.    Physical Exam  General:  well-nourished; alert,appropriate and cooperative throughout examination Head:  Normocephalic and atraumatic without obvious abnormalities. Eyes:  No corneal or conjunctival inflammation noted.Perrla. Funduscopic exam benign, without hemorrhages, exudates or papilledema.  Ears:  External ear exam shows no significant lesions or deformities.  Otoscopic examination reveals clear canals, tympanic membranes are intact bilaterally without bulging, retraction, inflammation or discharge. Hearing is grossly normal bilaterally. Nose:  External nasal examination shows no deformity or inflammation. Nasal mucosa are pink and moist without lesions or exudates. Tiny diamond nasal pin Mouth:  Oral mucosa and oropharynx without lesions or exudates.  Teeth in good repair. Neck:  No deformities, masses, or tenderness noted. Lungs:  Normal respiratory effort, chest expands symmetrically. Lungs are clear to auscultation, no crackles or wheezes. Heart:  Normal rate and regular rhythm. S1 and S2 normal without gallop, murmur, click, rub . S 4 Abdomen:  Bowel sounds positive,abdomen soft and non-tender without masses, organomegaly or hernias noted. Genitalia:  Dr Philis Pique, Gyn Msk:  No deformity or scoliosis noted of thoracic or lumbar spine.   Pulses:  R and L carotid,radial,dorsalis pedis and posterior tibial pulses are full and equal bilaterally Extremities:  No clubbing, cyanosis, edema, or deformity noted with normal full range of motion of all joints.   Minor crepitus of knees Neurologic:  alert & oriented X3, sensation intact to light touch over  feet, and finger-to-nose normal.   Skin:  Intact without suspicious lesions or rashes. Two tatooes (low back & L wrist) Cervical Nodes:  No lymphadenopathy noted Axillary Nodes:  No palpable lymphadenopathy Psych:  memory intact for recent and remote, normally interactive, and good eye contact.     Impression & Recommendations:  Problem # 1:  ROUTINE GENERAL MEDICAL EXAM@HEALTH  CARE FACL (ICD-V70.0)  Orders: EKG w/ Interpretation (93000) Venipuncture (49179) TLB-Lipid Panel (80061-LIPID) TLB-BMP (Basic Metabolic Panel-BMET) (15056-PVXYIAX) TLB-CBC Platelet - w/Differential (85025-CBCD) TLB-Hepatic/Liver Function Pnl (80076-HEPATIC) TLB-TSH (Thyroid Stimulating Hormone) (84443-TSH) TLB-Microalbumin/Creat Ratio, Urine (82043-MALB) TLB-A1C / Hgb A1C (Glycohemoglobin) (83036-A1C)  Problem # 2:  HYPERTENSION (ICD-401.9) Meds missed 07/31 Her updated medication list for this problem includes:    Metoprolol Tartrate 50 Mg Tabs (Metoprolol tartrate) .Marland Kitchen... Take 1/2 tablet twice a day.    Benazepril Hcl 40 Mg Tabs (Benazepril hcl) .Marland Kitchen... Take one tablet daily    Hydrochlorothiazide 25 Mg Tabs (Hydrochlorothiazide) .Marland Kitchen... Take 1/2 tablet daily by mouth  Problem # 3:  HYPERLIPIDEMIA (ICD-272.4)  Her updated medication list for this problem includes:    Simvastatin 40 Mg Tabs (Simvastatin) .Marland Kitchen... 1 by mouth once daily  Problem # 4:  DIABETES MELLITUS, TYPE II (ICD-250.00)  Her updated medication list for this problem includes:  Benazepril Hcl 40 Mg Tabs (Benazepril hcl) .Marland Kitchen... Take one tablet daily    Amaryl 4 Mg Tabs (Glimepiride) .Marland Kitchen... Take 1/2 tablet once a day by mouth    Metformin Hcl 500 Mg Tabs (Metformin hcl) .Marland Kitchen... 2tabs bid    Actos 30 Mg Tabs (Pioglitazone hcl) .Marland Kitchen... 1 once daily  Orders: TLB-Microalbumin/Creat Ratio, Urine (82043-MALB) TLB-A1C / Hgb A1C (Glycohemoglobin) (83036-A1C)  Problem # 5:  NONSPECIFIC ABNORMAL ELECTROCARDIOGRAM (ICD-794.31) Stable; PMH of neg stress  test  Complete Medication List: 1)  Metoprolol Tartrate 50 Mg Tabs (Metoprolol tartrate) .... Take 1/2 tablet twice a day. 2)  Simvastatin 40 Mg Tabs (Simvastatin) .Marland Kitchen.. 1 by mouth once daily 3)  Benazepril Hcl 40 Mg Tabs (Benazepril hcl) .... Take one tablet daily 4)  Amaryl 4 Mg Tabs (Glimepiride) .... Take 1/2 tablet once a day by mouth 5)  Metformin Hcl 500 Mg Tabs (Metformin hcl) .... 2tabs bid 6)  Hydrochlorothiazide 25 Mg Tabs (Hydrochlorothiazide) .... Take 1/2 tablet daily by mouth 7)  Fem Hrt 1.5  .... Take as directed 8)  Onetouch Delica Lancets Misc (Lancets) .... Check bloodsugar daily dx:250.00 9)  Freestyle Test Strp (Glucose blood) .... Use as directed 10)  Actos 30 Mg Tabs (Pioglitazone hcl) .Marland Kitchen.. 1 once daily 11)  Onetouch Ultra Test Strp (Glucose blood) .... Check bloodsugar once daily dx:250.00  Patient Instructions: 1)  Take an  81 mg coated Aspirin every day. 2)  Check your blood sugars regularly. If your readings are usually above : 150 or below 90 you should contact our office. 3)  See your eye doctor yearly to check for diabetic eye damage. 4)  Check your feet each night for sore areas, calluses or signs of infection. 5)  Check your Blood Pressure regularly. If it is above: 135/85 ON AVERAGE  you should make an appointment. Prescriptions: ACTOS 30 MG TABS (PIOGLITAZONE HCL) 1 once daily  #30 Tablet x 11   Entered and Authorized by:   Unice Cobble MD   Signed by:   Unice Cobble MD on 02/14/2010   Method used:   Print then Give to Patient   RxID:   [id number] HYDROCHLOROTHIAZIDE 25 MG TABS (HYDROCHLOROTHIAZIDE) TAKE 1/2 TABLET DAILY BY MOUTH  #90 x 1   Entered and Authorized by:   Unice Cobble MD   Signed by:   Unice Cobble MD on 02/14/2010   Method used:   Print then Give to Patient   RxID:   [id number] METFORMIN HCL 500 MG TABS (METFORMIN HCL) 2tabs bid  #180 x 3   Entered and Authorized by:   Unice Cobble MD   Signed by:   Unice Cobble  MD on 02/14/2010   Method used:   Print then Give to Patient   RxID:   [id number] AMARYL 4 MG TABS (GLIMEPIRIDE) TAKE 1/2 TABLET ONCE A DAY BY MOUTH  #90 x 1   Entered and Authorized by:   Unice Cobble MD   Signed by:   Unice Cobble MD on 02/14/2010   Method used:   Print then Give to Patient   RxID:   [id number] BENAZEPRIL HCL 40 MG TABS (BENAZEPRIL HCL) TAKE ONE TABLET DAILY  #90 x 3   Entered and Authorized by:   Unice Cobble MD   Signed by:   Unice Cobble MD on 02/14/2010   Method used:   Print then Give to Patient   RxID:   [id number] SIMVASTATIN 40 MG TABS (SIMVASTATIN) 1 by mouth once daily  #90 x  3   Entered and Authorized by:   Unice Cobble MD   Signed by:   Unice Cobble MD on 02/14/2010   Method used:   Print then Give to Patient   RxID:   [id number] METOPROLOL TARTRATE 50 MG TABS (METOPROLOL TARTRATE) TAKE 1/2 TABLET TWICE A DAY.  #90 x 3   Entered and Authorized by:   Unice Cobble MD   Signed by:   Unice Cobble MD on 02/14/2010   Method used:   Print then Give to Patient   RxID:   [id number]   Appended Document: cpx & lab/cbs

## 2010-11-01 ENCOUNTER — Other Ambulatory Visit (INDEPENDENT_AMBULATORY_CARE_PROVIDER_SITE_OTHER): Payer: 59

## 2010-11-01 DIAGNOSIS — I10 Essential (primary) hypertension: Secondary | ICD-10-CM

## 2010-11-01 DIAGNOSIS — E785 Hyperlipidemia, unspecified: Secondary | ICD-10-CM

## 2010-11-01 DIAGNOSIS — E119 Type 2 diabetes mellitus without complications: Secondary | ICD-10-CM

## 2010-11-01 LAB — LIPID PANEL
Cholesterol: 177 mg/dL (ref 0–200)
HDL: 37 mg/dL — ABNORMAL LOW (ref >39.00)
Triglycerides: 73 mg/dL (ref 0.0–149.0)

## 2010-11-01 LAB — HEPATIC FUNCTION PANEL
ALT: 58 U/L — ABNORMAL HIGH (ref 0–35)
Alkaline Phosphatase: 62 U/L (ref 39–117)
Bilirubin, Direct: 0.2 mg/dL (ref 0.0–0.3)
Total Bilirubin: 0.7 mg/dL (ref 0.3–1.2)
Total Protein: 7.7 g/dL (ref 6.0–8.3)

## 2010-11-01 LAB — HEMOGLOBIN A1C: Hgb A1c MFr Bld: 7.8 % — ABNORMAL HIGH (ref 4.6–6.5)

## 2010-11-01 LAB — MICROALBUMIN / CREATININE URINE RATIO
Microalb Creat Ratio: 0.6 mg/g (ref 0.0–30.0)
Microalb, Ur: 2.6 mg/dL — ABNORMAL HIGH (ref 0.0–1.9)

## 2010-11-05 ENCOUNTER — Encounter: Payer: Self-pay | Admitting: Internal Medicine

## 2010-11-05 LAB — HM MAMMOGRAPHY: HM Mammogram: NORMAL

## 2010-11-08 ENCOUNTER — Encounter: Payer: Self-pay | Admitting: Internal Medicine

## 2010-11-08 ENCOUNTER — Ambulatory Visit (INDEPENDENT_AMBULATORY_CARE_PROVIDER_SITE_OTHER): Payer: 59 | Admitting: Internal Medicine

## 2010-11-08 DIAGNOSIS — I10 Essential (primary) hypertension: Secondary | ICD-10-CM

## 2010-11-08 DIAGNOSIS — E119 Type 2 diabetes mellitus without complications: Secondary | ICD-10-CM

## 2010-11-08 DIAGNOSIS — E785 Hyperlipidemia, unspecified: Secondary | ICD-10-CM

## 2010-11-08 MED ORDER — GLIMEPIRIDE 2 MG PO TABS: 2.0000 mg | ORAL_TABLET | ORAL | Status: DC

## 2010-11-08 MED ORDER — ONETOUCH DELICA LANCETS MISC: Status: DC

## 2010-11-08 MED ORDER — GLUCOSE BLOOD VI STRP: ORAL_STRIP | Status: DC

## 2010-11-08 NOTE — Progress Notes (Signed)
Subjective:    Patient ID: [name], female    DOB: Aug 03, 1955, 55 y.o.   MRN: [id number]  HPI #1 ) Diabetes status assessment :Fasting or morning glucose range:  134-180 or average :  Not calculated    ;   Highest glucose 2 hours after any meal:  <200, usually < 180 ;  Hypoglycemia :  no  .                                                           Excess thirst :no;  Excess hunger:  no ;  Excess urination:  no .                                            Lightheadedness with standing:  no ; Chest pain:  no ; Palpitations :no ;  Pain in  calves with walking:  no .                                                                                                                                                              Non healing skin  ulcers or sores,especially over the feet:  no ; Numbness or tingling or burning in feet : yes, ?from L-S radiculopathy.                                                                                                                                                 Significant change in  Weight : down 14# off Actos  ;Vision changes : no  Exercise : yes , 2-3 X/ week as walking ;  Nutrition/diet:  Sweets eliminated;   Medication compliance : yes ; Medication adverse  Effects:  no  ;  Eye exam : in 07/12 ; Foot care : no ;  A1c/ urine microalbumin monitor:  A1c 7.8% 11/01/2010(average sugar 176, long term risk 56% increased)  ; A1c  Was 6.9% on 06/01/2010( 151 / 38% risk). A1c was 5.8% on 02/14/2010(120/16% risk). #2)  Dyslipidemia assessment: Lab results  reviewed:                                                                                  Prior advanced Lipid testing : NMR Lipoprofile no / [facility] Heart Panel:no    . LDL 125,HDL 37,TG 37                      Family history of premature CAD/ MI: no ( mother had MI in 76s)                                                                                 Smoking : remotely   HTN: 120-130/80s        ROS: fatigue: no ; abd pain/bowel changes: no ; myalgias:leg cramps RLE @ night, ? From prior surgery 2007;  syncope : no ; memory loss: no;skin changes: no    Review of Systems     Objective:   Physical Exam Gen.: Healthy and well-nourished in appearance. Alert, appropriate and cooperative throughout exam. Eyes: No corneal or conjunctival inflammation noted. Pupils equal round reactive to light and accommodation. Fundal exam is benign without hemorrhages, exudate, papilledema.Ears: External  ear exam reveals no significant lesions or deformities. Canals clear .TMs normal. Hearing is grossly normal bilaterally. Neck: No deformities, masses, or tenderness noted.  Thyroid : no nodules . Lungs: Normal respiratory effort; chest expands symmetrically. Lungs are clear to auscultation without rales, wheezes, or increased work of breathing. Heart: Normal rate and rhythm. Normal S1 and S2. No gallop, click, or rub. No  murmur. Abdomen: Bowel sounds normal; abdomen soft and nontender. No masses, organomegaly or hernias noted. No clubbing, cyanosis, edema, or deformity noted. Joints normal. Nail health  good. Vascular: Carotid, radial artery, dorsalis pedis and dorsalis posterior tibial pulses are full and equal. No bruits present. Neurologic: Alert and oriented x3. Deep tendon reflexes symmetrical and normal. Light touch normal over feet        Skin: Intact without suspicious lesions or rashes. Psych: Mood and affect are normal. Normally interactive  Assessment & Plan:  #1 diabetes uncontrolled on the combined agent and off Actos  #2 hypertension controlled  #3 dyslipidemia; HDL LV are not at goal despite the atorvastatin 40 mg daily.  Plan: #1 and colon appear I 2 mg daily with breakfast  #2 fasting [facility] heart panel (1304X) in 10 weeks. This test will help decide  whether ezetimibe should be added if she's a hyperabsorber. Additionally changing to other agents would be considered such as [place]

## 2010-11-08 NOTE — Progress Notes (Signed)
Addended by: Annamaria Helling on: 11/08/2010 10:01 AM   Modules accepted: Orders

## 2010-11-08 NOTE — Patient Instructions (Signed)
Please schedule a fasting [facility] heart panel (1304X) in 10 weeks. This will optimally assess risk and options.

## 2010-11-29 NOTE — Assessment & Plan Note (Signed)
[facility] OFFICE NOTE   NAME:[name]                      MRN:          [id number]  DATE:11/22/2006                            DOB:          24-Jul-1955    [name] was seen for a comprehensive physical examination Nov 22, 2006.   Significant new history includes her second laminectomy in September  2007 for herniated disc by Dr. Lindwood Qua. Additionally she had a D&C  for uterine polyp in December of 2007 by Dr. Bobbye Charleston.   PAST MEDICAL HISTORY:  Otherwise unchanged. She had arthroscopy of the  knee in 1980. She had foot surgery on 2 occasions in the  mid 1980s. She  had 1 pregnancy, 1 delivery by C-section. She had cryosurgery of the  cervix in 2003. Her initial laminectomy was in March 2007. She was  diagnosed by flexible sigmoidoscopy as having colitis in her 80s.   MEDICAL PROBLEMS:  Include; diabetes, borderline hypertension,  dyslipidemia.   FAMILY HISTORY:  Strongly positive for diabetes, hypertension, and  myocardial infarction. Her mother had a stenting in 2007 and is due to  have an ablation procedure. Her father had diabetes, hypertension, and  myocardial infarction, and a large gastric ulcer over a gastric vessel  with terminal hemorrage.  Maternal grandmother had stroke and diabetes.  Both grandfathers had heart attacks.   She has not smoked since 2002; she does not drink. She is walking 15  minutes 2 to 3 times a week but is limited by hip and leg pain. She is  on [place] Weight [employer].   SHE IS INTOLERANT/ALLERGIC TO CODEINE, SEPTRA DS, AND NEOSPORIN.  LIPITOR RESULTS IN INCREASED LIVER FUNCTION TESTS.   She is on;  1. Hydrochlorothiazide 25 mg 1 half daily.  2. Amaryl 4 mg daily.  3. Glucophage XR 500 two twice a day.  4. Benazepril 40 mg daily.  5. Actos 30 mg 1 half daily.  6. Metoprolol 50 a half twice a day.  7. Vytorin 10/20 daily.  8. Lyrica 100 mg 3 times a day.  9. Femhrt.   REVIEW OF SYSTEMS:  Negative for cardiopulmonary, GI, or genitourinary  symptoms.   She will have some paresthesias in the right lower extremity along the  lateral aspect of the calf and over the dorsum of the foot. She denies  any inguinal paresthesias, urinary, or stool incontinence.   The remainder of the review of systems was negative.   Weight is essentially stable at 247, pulse 60, respiratory rate 15,  blood pressure 140/80.  She has mild arteriolar narrowing on fundal exam. She has a small  jeweled post in the left naris. Nares are patent. Tympanic membranes are  normal.  Thyroid is normal to palpation. She has no lymphadenopathy about the  neck or axilla.   She has an S4 with no murmurs or gallops. All pulses are intact.   She has no organomegaly or masses.   There is trace edema, particularly of the left lower extremity.   Deep tendon reflexes are 0 to 1/2 plus at the knees. Straight leg  raising is negative. She was able to sit up from the table without help.   The lumbosacral area scar has a superimposed tattoo of [place] letters.  The scar is well-healed. She has hyperpigmentation of her shoulders  related to pressure phenomenon from her bra straps.   NEURO/PSYCHIATRIC EVALUATION: Normal.   Comprehensive labs will be collected. A screening colonoscopy will be  scheduled. Further recommendations are pending return of these lab  results.     Darrick Penna. Natasha Darner, MD,FACP,FCCP  Electronically Signed    [facility]/MedQ  DD: 11/22/2006  DT: 11/22/2006  Job #: [id number]

## 2010-12-02 ENCOUNTER — Other Ambulatory Visit: Payer: Self-pay

## 2010-12-02 MED ORDER — ATORVASTATIN CALCIUM 40 MG PO TABS: 40.0000 mg | ORAL_TABLET | Freq: Every day | ORAL | Status: DC

## 2010-12-02 NOTE — Op Note (Signed)
[name], [name]               ACCOUNT NO.:  [id number]   MEDICAL RECORD NO.:  [id number]          PATIENT TYPE:  OIB   LOCATION:  52                         FACILITY[facility]  [facility]   PHYSICIAN:  Kipp Brood. Gioffre, M.D.DATE OF BIRTH:  Oct 17, 1955   DATE OF PROCEDURE:  03/28/2006  DATE OF DISCHARGE:                                 OPERATIVE REPORT   SURGEON:  Kipp Brood. Gladstone Lighter, M.D.   ASSISTANT:  Tarri Glenn, M.D.   PREOPERATIVE DIAGNOSES:  1. 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  2. Preop left leg pain.  3. Spinal stenosis at L4-5.   POSTOPERATIVE DIAGNOSES:  1. 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  2. Preop left leg pain.  3. Spinal stenosis at L4-5.   OPERATION:  1. With a central decompression of L4-5.  2. Bilateral microdiskectomies at L4-5.  3. Foraminotomies for both five roots bilaterally.   PROCEDURE:  Under general anesthesia with the patient on spinal frame,  routine prep was carried out.  She was given 1 g of IV Ancef.  At this time,  two needles were placed in the back for localization purposes.  X-ray was  taken.  Incision was made over the L4-5 space.  The incision was carried  down through the lamina of L4-5 region and two Kocher clamps were placed on  the spinous processes to identify the space.  Once the proper space was  identified, we then removed as spinous process of L4 and a portion of L5 and  we went down and did a central decompressive lumbar laminectomy.  We went  distally and removed all the scar tissue that was present on the right from  her previous laminectomy.  We isolated the dura.  Great care was taken to  protect the dura at all times.  We carefully dissected the scar and  ligamentum flavum off the dura.  We then first approached the left side.  We  did a foraminotomy for the L5 root on the left.  We identified the root  nicely and then identified disc space and gently retracted the dura,  went  down and made a cruciate incision in the disk space, did a complete  diskectomy and large pieces of disk were removed.  We then went out and made  sure we had a nice foraminotomy and completely freed up the L5 root on the  left.  Following that, we then went to the right side which was much more  scarred down.  We removed all the scar carefully under the microscope.  We  identified the L5 root.  We removed large fragments of disk in the lateral  recess, then we went down into the disk space and completed diskectomy.  Once this was done, we did a nice foraminotomy for the L5 root on the right.  We then went back over on the left-sided and made sure we did not force any  disk material to the left.  We removed small pieces of disk from the space.  Thoroughly irrigated out the area.  Good  hemostasis was maintained.  We  loosely lavaged with thrombin-soaked Gelfoam and closed the wound in layers  in usual fashion.  I injected about 20 mL 0.50% plain Marcaine in the wound  site.  Note, when I  closed the wound, the deep proximal portion of the wound was partially left  open for drainage purposes as well as the distal deep portion.  The skin was  closed with metal staples.  Sterile Neosporin dressings was applied.  She  left the operating room in satisfactory condition.           ______________________________  Kipp Brood Gladstone Lighter, M.D.     RAG/MEDQ  D:  03/28/2006  T:  03/29/2006  Job:  [id number]

## 2010-12-02 NOTE — Op Note (Signed)
[name], [name]               ACCOUNT NO.:  [id number]   MEDICAL RECORD NO.:  [id number]          PATIENT TYPE:  AMB   LOCATION:  DAY                          FACILITY:  [facility] [place]   PHYSICIAN:  Ronald A. Gioffre, M.D.DATE OF BIRTH:  1956/03/29   DATE OF PROCEDURE:  10/02/2005  DATE OF DISCHARGE:                                 OPERATIVE REPORT   SURGEON:  Kipp Brood. Gladstone Lighter, M.D.   ASSISTANT:  Dr. Tarri Glenn, MD.   PREOPERATIVE DIAGNOSES:  1.  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  2.  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  3.  Herniated disk at L3-4 in the right.  4.  Herniated disk at L4-5 on the right.   POSTOPERATIVE DIAGNOSES:  1.  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  2.  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  3.  Herniated disk at L3-4 in the right.  4.  Herniated disk at L4-5 on the right.   OPERATION:  1.  Decompression of the lateral recess for severe lateral recess stenosis      at L3-4 on the right.  2.  Decompression of the lateral recess for lateral recess stenosis at L4-5      on the right, so we did L3-4 and L4-5 on the right. We also did micro      diskectomies at L3-4 on the right and L4-5 on the right.   PROCEDURE:  Under general anesthesia, the patient on a spinal frame, the  patient had 1 gram of IV Ancef. A routine orthopedic prep and draping of the  back was carried out. Two needles were placed in the back for localization  purposes and x-ray was taken. At that time, an incision was made over the L3-  4, L4-5 interspace, bleeders identified and cauterized. Self-retaining  retractors were inserted. I then stripped the muscle from the lamina and  spinous processes on the right at L3-4 and L4-5. Another x-ray was taken.  Following that, we then went down and carried out our hemilaminectomy first  at L3-4 on the right. I had to utilize the bur because she had a marked  overlapping and  shingling effect of the L4-5 lamina. We had to go out far  laterally because of the lateral recess stenosis. Note her bone was quite  thick and overgrown. At this time, we then first identified the ligamentum  flavum, we protected the dura, removed the ligamentum flavum. Before we  brought the microscope in, we then went down to the L4-5 interspace and that  did a hemilaminectomy by utilizing the bur and the Kerr[place] rongeurs. We  then brought microscope in and first removed the ligamentum flavum totally  from the L3-4 region, we identified the L4 root, gently retracted the root,  cauterized the lateral recess vein and made a cruciate incision in the  posterior longitudinal ligament and did a microdiskectomy. Note, she had a  large spur formation in that area as well. This was rather a hard type disk.  We totally freed  up the root. We did a foraminotomy and had nice freedom of  the root now in the dura. We irrigated the area out and loosely applied some  thrombin-soaked Gelfoam. Note prior to doing this, we did take another x-ray  to make sure we had the exact disk spaces. We then went down the L4-5 space,  brought the microscope distally and then removed the ligamentum flavum while  we protected the dura. We then gently retracted the L5 root and cauterized  the lateral recess veins. She had rather significant stenosis laterally. We  identified that this was a huge disk rupture. A cruciate incision was made  in the posterior longitudinal ligament and a complete diskectomy was carried  out. Note, we compressed the disk material which was subligamentous down  into the space medially and laterally and then went down to remove the  remaining part of the disk. This was the larger of the two disks. We then  did a foraminotomy and nicely freed up the root and at this point irrigated  the area out and loosely applied some thrombin-soaked Gelfoam. The wound was  closed in layers in the usual fashion  except I left the proximal deep  portion and distal deep portion of the wound partially open for drainage  purposes. The skin was closed with metal staples. Prior to closing the skin,  I injected 20 mL of 0.5% Marcaine with epinephrine into the wound site. A  sterile Neosporin dressing was applied. The patient had 30 mg of IV Toradol  prior to leaving surgery and she had 1 gram of IV Ancef prior to surgery.           ______________________________  Kipp Brood Gladstone Lighter, M.D.     RAG/MEDQ  D:  10/02/2005  T:  10/03/2005  Job:  [id number]

## 2010-12-02 NOTE — Op Note (Signed)
NAMEARRIANA, LOHMANN               ACCOUNT NO.:  [id number]   MEDICAL RECORD NO.:  [id number]          PATIENT TYPE:  AMB   LOCATION:  [place]                           FACILITY:  [place]   PHYSICIAN:  Bobbye Charleston, M.D. DATE OF BIRTH:  [birth year]   DATE OF PROCEDURE:  06/29/2006  DATE OF DISCHARGE:                               OPERATIVE REPORT   PREOPERATIVE DIAGNOSIS:  Perimenopausal menorrhagia.   POSTOPERATIVE DIAGNOSIS:  Endometrial polyps.   PROCEDURE:  D&C hysteroscopy.   SURGEON:  Bobbye Charleston, M.D.   ANESTHESIA:  General LMA.   SPECIMEN:  Polyp and curettings.   ESTIMATED BLOOD LOSS:  Minimal.   IV FLUIDS:  700 mL.   URINE OUTPUT:  Not measured.   COMPLICATIONS:  None.   FINDINGS:  A 2 cm long polyp in the endometrial cavity that was removed.  There was otherwise scant endometrium seen.   MEDICATIONS:  None.   COUNTS:  Correct x3.   TECHNIQUE:  After adequate general LMA anesthesia was achieved, the  patient was prepped and draped in the sterile fashion in dorsal  lithotomy position.  The bladder was then emptied with a red rubber  catheter and speculum placed in the vagina.  Single-tooth tenaculum was  used to stabilize the cervix and the cervix was dilated with Kennon Rounds  dilators.  The hysteroscope was passed in and the above findings noted  including the long endometrial polyp.  This  was easily removed with polyp forceps and the scope revealed shaggy  endometrium otherwise.  The endometrial curettings were sent separate  from the polyp and all instruments were then withdrawn from the vagina.  The hysteroscopy fluid deficit was only 65 mL.  The patient was returned  to recovery in stable condition.      Bobbye Charleston, M.D.  Electronically Signed     MH/MEDQ  D:  06/29/2006  T:  06/29/2006  Job:  [id number]

## 2011-01-13 ENCOUNTER — Other Ambulatory Visit: Payer: Self-pay | Admitting: Obstetrics and Gynecology

## 2011-01-17 ENCOUNTER — Other Ambulatory Visit: Payer: Self-pay | Admitting: Internal Medicine

## 2011-01-17 MED ORDER — SITAGLIPTIN PHOS-METFORMIN HCL 50-1000 MG PO TABS: 1.0000 | ORAL_TABLET | Freq: Two times a day (BID) | ORAL | Status: DC

## 2011-01-19 ENCOUNTER — Other Ambulatory Visit (INDEPENDENT_AMBULATORY_CARE_PROVIDER_SITE_OTHER): Payer: 59

## 2011-01-19 DIAGNOSIS — E785 Hyperlipidemia, unspecified: Secondary | ICD-10-CM

## 2011-01-19 NOTE — Progress Notes (Signed)
12 Labs only

## 2011-01-25 ENCOUNTER — Other Ambulatory Visit: Payer: Self-pay | Admitting: Internal Medicine

## 2011-01-25 NOTE — Telephone Encounter (Signed)
A1c 250.00

## 2011-01-28 ENCOUNTER — Other Ambulatory Visit: Payer: Self-pay | Admitting: Internal Medicine

## 2011-01-30 NOTE — Telephone Encounter (Signed)
a1c 250.00

## 2011-02-01 ENCOUNTER — Other Ambulatory Visit: Payer: Self-pay | Admitting: Internal Medicine

## 2011-02-07 ENCOUNTER — Encounter: Payer: Self-pay | Admitting: Internal Medicine

## 2011-02-09 ENCOUNTER — Other Ambulatory Visit: Payer: Self-pay | Admitting: Internal Medicine

## 2011-02-09 MED ORDER — SITAGLIPTIN PHOS-METFORMIN HCL 50-1000 MG PO TABS: 1.0000 | ORAL_TABLET | Freq: Two times a day (BID) | ORAL | Status: DC

## 2011-02-09 NOTE — Telephone Encounter (Signed)
RX sent to pharmacy  

## 2011-03-03 ENCOUNTER — Ambulatory Visit: Payer: 59 | Admitting: Internal Medicine

## 2011-03-10 ENCOUNTER — Ambulatory Visit: Payer: 59 | Admitting: Internal Medicine

## 2011-03-14 ENCOUNTER — Ambulatory Visit (INDEPENDENT_AMBULATORY_CARE_PROVIDER_SITE_OTHER): Payer: 59 | Admitting: Internal Medicine

## 2011-03-14 ENCOUNTER — Encounter: Payer: Self-pay | Admitting: Internal Medicine

## 2011-03-14 DIAGNOSIS — I10 Essential (primary) hypertension: Secondary | ICD-10-CM

## 2011-03-14 DIAGNOSIS — E119 Type 2 diabetes mellitus without complications: Secondary | ICD-10-CM

## 2011-03-14 DIAGNOSIS — E785 Hyperlipidemia, unspecified: Secondary | ICD-10-CM

## 2011-03-14 MED ORDER — ATORVASTATIN CALCIUM 40 MG PO TABS: 40.0000 mg | ORAL_TABLET | ORAL | Status: DC

## 2011-03-14 MED ORDER — METFORMIN HCL ER 500 MG PO TB24: 500.0000 mg | ORAL_TABLET | Freq: Every day | ORAL | Status: DC

## 2011-03-14 MED ORDER — EZETIMIBE 10 MG PO TABS: 10.0000 mg | ORAL_TABLET | Freq: Every day | ORAL | Status: DC

## 2011-03-14 NOTE — Patient Instructions (Addendum)
Eat a low-fat diet with lots of fruits and vegetables, up to 7-9 servings per day. Avoid obesity; your goal is waist measurement < 40 inches.Consume less than 40 grams of sugar per day from foods & drinks with High Fructose Corn Sugar as #1,2,3 or # 4 on label. Follow the low carb nutrition program in [place] as closely as possible to prevent Diabetes progression & complications. White carbohydrates (potatoes, rice, bread, and pasta) have a high spike of sugar and a high load of sugar. For example a  baked potato has a cup of sugar and a  french fry  2 teaspoons of sugar. Yams, wild  rice, whole grained bread &  wheat pasta have been much lower spike and load of  sugar. Portions should be the size of a deck of cards or your palm.  Hold  Janumet  until the followup lab studies.  Consider niacin 500 mg each evening taken with 3 tablespoons of apple sauce to mitigate any flushing. This vitamin is a vasodilator and would be expected to cause flushing. This is not an adverse reaction.  Please  schedule fasting Labs in 10 weeks : BMET,Lipids, hepatic panel, A1c (250.00, 272.4)

## 2011-03-14 NOTE — Progress Notes (Signed)
  Subjective:    Patient ID: [name], female    DOB: May 12, 1956, 55 y.o.   MRN: [id number]  HPI Dyslipidemia assessment: Prior Advanced Lipid Testing: No.   Family history of premature CAD/ MI: father MI @ 15 in context of GI bleed. PGF MI @ 61 .  Nutrition: no plan .  Exercise: walking 3 X/ week for 30 min . Diabetes : 6.4%; FBS average 100-120; no hypoglycemia . HTN: BP well controlled, <130/80. Smoking history  : quit in college .   Weight :  Stable; 10 # weight loss off Actos.  Lab results reviewed :[facility] panel: hyperabsorber; CK 328; LDL 121; Lp(a) 110(< 30); CRP 3.9 (Note: LpPLA2 WNL);insulin 63; T/T genotype.     Review of Systems ROS: fatigue: no ; chest pain : no ;claudication: no; palpitations: no; abd pain/bowel changes: no ; myalgias:no, leg cramps @ night, not on vit D;  syncope : no ; memory loss: no;skin changes: no.     Objective:   Physical Exam Gen.: Healthy and well-nourished in appearance. Alert, appropriate and cooperative throughout exam. Eyes: No corneal or conjunctival inflammation noted.  Neck: No deformities, masses, or tenderness noted.  Thyroid full w/o nodules. Lungs: Normal respiratory effort; chest expands symmetrically. Lungs are clear to auscultation without rales, wheezes, or increased work of breathing. Heart: Normal rate and rhythm. Normal S1 and S2. No gallop, click, or rub. S4 w/o  murmur.                                                      Musculoskeletal/extremities:No clubbing, cyanosis, edema, or deformity noted.  Nail health  good. Vascular: Carotid, radial artery, dorsalis pedis and  posterior tibial pulses are full and equal. No bruits present. Neurologic: Alert and oriented x3. Deep tendon reflexes symmetrical and normal. Light touch normal over feet.         Skin: Intact without suspicious lesions or rashes.  Psych: Mood and affect are normal. Normally interactive                                                                                          Assessment & Plan:   #1 dyslipidemia; risks  outlined above. These include hyperabsorption, elevated Lp(a), increased  insulin level, LDL over 100.  #2 diabetes, good control.  Plan: See orders and recommendations.

## 2011-03-31 ENCOUNTER — Telehealth: Payer: Self-pay

## 2011-03-31 NOTE — Telephone Encounter (Signed)
According to her med list she is on extended release---that is only 1x a day---she can increase to 2 a day( at same time)  And that is equivalent to the reg metformin 1000 mg bid

## 2011-03-31 NOTE — Telephone Encounter (Signed)
Message left on voicemail: patient would like a call to discuss her metformin, med was changed at last OV. Blood sugar is now elevated   I called and spoke with patient, patient stated she only took blood sugar x 1 (this am) 225 fasting. Patient stated she was on Metformin 1040m bid (prior to 02/2011 visit) now she is taking 5044monce a day. ? If she should increase. Patient is aware Dr.Hopper is out of the office and I will forward to another Dr to address   Dr.Lowne please advise

## 2011-03-31 NOTE — Telephone Encounter (Signed)
Left message on voicemail with Dr.Lowne's recommendations. Patient to continue to check blood sugar over the weekend to make sure blood sugar is decreasing .

## 2011-04-01 ENCOUNTER — Other Ambulatory Visit: Payer: Self-pay | Admitting: Internal Medicine

## 2011-04-03 ENCOUNTER — Other Ambulatory Visit: Payer: Self-pay | Admitting: Internal Medicine

## 2011-04-03 MED ORDER — BENAZEPRIL HCL 40 MG PO TABS: 40.0000 mg | ORAL_TABLET | Freq: Every day | ORAL | Status: DC

## 2011-04-03 NOTE — Telephone Encounter (Signed)
Pt left VM that she has yet to hear anything about BS and would like to get call back. Return call and advise Pt of Dr. Etter Sjogren recommendation

## 2011-04-03 NOTE — Telephone Encounter (Signed)
rx sent

## 2011-04-04 NOTE — Telephone Encounter (Signed)
Called home number -patient not available, called work number and left message on voicemail with instruction and informed patient to call if any questions or concerns

## 2011-04-10 ENCOUNTER — Telehealth: Payer: Self-pay

## 2011-04-10 NOTE — Telephone Encounter (Signed)
Left a message for pt to return call 

## 2011-04-10 NOTE — Telephone Encounter (Signed)
Pt called and stated she spoke with a nurse last week about her blood sugar readings and was told to take 2,000 mg all at once.  Pt states she has monitored her BS readings and it has come down but her readings remain around 200. Pt states since her prescription has been tripled she needs another prescription called in. Pls advise.

## 2011-04-10 NOTE — Telephone Encounter (Signed)
Please clarify "  2000 mg of " of which specific medication

## 2011-04-11 NOTE — Telephone Encounter (Signed)
Metformin XR 1000 mg with breakfast and evening meal; check A1 C, BUN and creatinine in 6 weeks  (250.02)

## 2011-04-11 NOTE — Telephone Encounter (Signed)
Patient called stating she hasnt had a return call - patient increased meformin from 500 mg - to 2000 mg but said blood sugar is still high 190 - she wants to know if if she shoul continue taking metformin if so she needs new rx called in --- also see note [phone]

## 2011-04-11 NOTE — Telephone Encounter (Signed)
I have attempted to contact this patient by phone with the following results: left message to return my call on answering machine (home and work).

## 2011-04-11 NOTE — Telephone Encounter (Signed)
I spoke to pt and she states she was told by nurse to take 2000 mg of Metformin daily.  Phone note states pt told to take 2 of the 500 mg tabs.   Pt is taking 4 tabs of the XR that was prescribed on 03/14/11.   Pt took this at one time for 2 days, then began taking 1000 at lunch and 1000 at dinner due to stomach upset.  Pt states sugars have been in 220's since last week.  Today FBS was in 190's. Pt would like to know if she should continue 2000 mg daily, if so she needs new RX sent to [facility]. Also, if she should continue taking this dose can she continue to take them at lunch and dinner or should she take all at once?   Please advise.

## 2011-04-12 NOTE — Telephone Encounter (Signed)
Left message to call office

## 2011-04-17 NOTE — Telephone Encounter (Signed)
Left message to call office

## 2011-04-24 ENCOUNTER — Telehealth: Payer: Self-pay

## 2011-04-24 MED ORDER — METFORMIN HCL ER (OSM) 1000 MG PO TB24: 1000.0000 mg | ORAL_TABLET | Freq: Two times a day (BID) | ORAL | Status: DC

## 2011-04-24 NOTE — Telephone Encounter (Signed)
Patient aware rx sent

## 2011-06-12 ENCOUNTER — Other Ambulatory Visit: Payer: Self-pay | Admitting: Internal Medicine

## 2011-06-12 MED ORDER — HYDROCHLOROTHIAZIDE 25 MG PO TABS: 25.0000 mg | ORAL_TABLET | ORAL | Status: DC

## 2011-06-12 NOTE — Telephone Encounter (Signed)
RX sent

## 2011-06-12 NOTE — Telephone Encounter (Signed)
RX called in, would not go through electronically

## 2011-07-07 ENCOUNTER — Other Ambulatory Visit (INDEPENDENT_AMBULATORY_CARE_PROVIDER_SITE_OTHER): Payer: 59

## 2011-07-07 DIAGNOSIS — E119 Type 2 diabetes mellitus without complications: Secondary | ICD-10-CM

## 2011-07-07 DIAGNOSIS — E785 Hyperlipidemia, unspecified: Secondary | ICD-10-CM

## 2011-07-07 LAB — HEPATIC FUNCTION PANEL
Albumin: 4.3 g/dL (ref 3.5–5.2)
Alkaline Phosphatase: 61 U/L (ref 39–117)
Total Protein: 7.8 g/dL (ref 6.0–8.3)

## 2011-07-07 LAB — BASIC METABOLIC PANEL
Chloride: 109 mEq/L (ref 96–112)
Creatinine, Ser: 1.1 mg/dL (ref 0.4–1.2)
Potassium: 4.1 mEq/L (ref 3.5–5.1)
Sodium: 143 mEq/L (ref 135–145)

## 2011-07-07 LAB — LIPID PANEL
Cholesterol: 157 mg/dL (ref 0–200)
HDL: 44.8 mg/dL (ref >39.00)
LDL Cholesterol: 97 mg/dL (ref 0–99)
Triglycerides: 75 mg/dL (ref 0.0–149.0)
VLDL: 15 mg/dL (ref 0.0–40.0)

## 2011-07-07 LAB — HEMOGLOBIN A1C: Hgb A1c MFr Bld: 8.4 % — ABNORMAL HIGH (ref 4.6–6.5)

## 2011-08-23 ENCOUNTER — Encounter: Payer: Self-pay | Admitting: Internal Medicine

## 2011-08-23 ENCOUNTER — Ambulatory Visit (INDEPENDENT_AMBULATORY_CARE_PROVIDER_SITE_OTHER): Payer: 59 | Admitting: Internal Medicine

## 2011-08-23 DIAGNOSIS — E119 Type 2 diabetes mellitus without complications: Secondary | ICD-10-CM

## 2011-08-23 DIAGNOSIS — R0989 Other specified symptoms and signs involving the circulatory and respiratory systems: Secondary | ICD-10-CM

## 2011-08-23 DIAGNOSIS — L84 Corns and callosities: Secondary | ICD-10-CM

## 2011-08-23 DIAGNOSIS — I10 Essential (primary) hypertension: Secondary | ICD-10-CM

## 2011-08-23 DIAGNOSIS — E785 Hyperlipidemia, unspecified: Secondary | ICD-10-CM

## 2011-08-23 MED ORDER — SAXAGLIPTIN-METFORMIN ER 2.5-1000 MG PO TB24: ORAL_TABLET | ORAL | Status: DC

## 2011-08-23 NOTE — Progress Notes (Signed)
  Subjective:    Patient ID: [name], female    DOB: 1956/03/16, 56 y.o.   MRN: [id number]  HPI HYPERTENSION: Disease Monitoring: Blood pressure range-< 135/85 consistently  Chest pain, palpitations- no       Dyspnea-no Medications: Compliance- yes  Lightheadedness,Syncope- no    Edema- worse in Summer  DIABETES: Disease Monitoring: Blood Sugar ranges-150-199; 2 hr post meal not checked Polyuria/phagia/dipsia- no      Visual problems- no;  Ophth appt 2/8 Foot care- none Medications: Compliance- yes  Hypoglycemic symptoms- no Diet: low glycemic diet Exercise: walking 3 X/ week X 30 min   HYPERLIPIDEMIA: Disease Monitoring: See symptoms for Hypertension Medications: Compliance- yes  Abd pain, bowel changes- no  Muscle aches- leg cramps since back surgery         Review of Systems   Weight is stable; she denies melena or rectal bleeding. Her colonoscopy is up-to-date     Objective:   Physical Exam Gen.: Healthy and well-nourished in appearance. Alert, appropriate and cooperative throughout exam.  Eyes: No corneal or conjunctival inflammation noted.  Neck: No deformities, masses, or tenderness noted. Thyroid normal. Lungs: Normal respiratory effort; chest expands symmetrically. Lungs are clear to auscultation without rales, wheezes, or increased work of breathing. Heart: Normal rate and rhythm. Normal S1 and S2. No gallop, click, or rub. S4 w/o  murmur. Abdomen: Bowel sounds normal; abdomen soft and nontender. No masses, organomegaly or hernias noted. No AAA or bruits                                                                                Musculoskeletal/extremities:  No clubbing, cyanosis, edema, or deformity noted. Joints normal. Nail health  Good. Corn L 5th toe Vascular: Carotid, radial artery, & dorsalis pedis  are full and equal. R carotid bruit present. Slightly decreased PTP. Neuro: DTRSs WNL; light touch normal WNL over feet          Skin:  Intact without suspicious lesions or rashes.  Psych: Mood and affect are normal. Normally interactive                                                                                         Assessment & Plan:

## 2011-08-23 NOTE — Assessment & Plan Note (Signed)
Lipids are essentially at goal. Niacin could possibly be aggravating diabetic control and will be discontinued. Liver function tests will need to be monitored because of statin.

## 2011-08-23 NOTE — Assessment & Plan Note (Signed)
BP is  well controlled; no change indicated

## 2011-08-23 NOTE — Assessment & Plan Note (Signed)
Diabetes poorly controlled with an A1c of 8.4%. This would correlate with an average glucose of 194 and long-term risk of 60%. The metformin will be changed to Kombiglyze XR 2.11/998 two daily

## 2011-08-23 NOTE — Patient Instructions (Addendum)
The most common cause of elevated triglycerides is the ingestion of sugar from high fructose corn syrup sources added to processed foods & drinks.  Eat a low-fat diet with lots of fruits and vegetables, up to 7-9 servings per day. Consume less than 30 (preferably ZERO) grams of sugar per day from foods & drinks with High Fructose Corn Syrup (HFCS) sugar as #1,2,3 or # 4 on label.Whole [employer], [employer] [place] do not carry products with HFCS. Follow a  low carb nutrition program such as [place] or [employer]  to prevent Diabetes progression . White carbohydrates (potatoes, rice, bread, and pasta) have a high spike of sugar and a high load of sugar. For example a  baked potato has a cup of sugar and a  french fry  2 teaspoons of sugar. Yams, wild  rice, whole grained bread &  wheat pasta have been much lower spike and load of  sugar. Portions should be the size of a deck of cards or your palm. Please  schedule fasting Labs : BMET,Lipids, ALT,AST,  TSH,A1c , urine microalbumin, CK in 8 weeks. PLEASE BRING THESE INSTRUCTIONS TO FOLLOW UP  LAB APPOINTMENT.This will guarantee correct labs are drawn, eliminating need for repeat blood sampling ( needle sticks ! ). Diagnoses /Codes: 250.02, 272.4, 995.20  Stop Niacin

## 2011-08-25 ENCOUNTER — Other Ambulatory Visit: Payer: Self-pay | Admitting: Internal Medicine

## 2011-08-30 ENCOUNTER — Telehealth: Payer: Self-pay | Admitting: Internal Medicine

## 2011-08-30 NOTE — Telephone Encounter (Signed)
Reason for Call: Caller: [name]/Patient; PCP: Unice Cobble; CB#: [phone]; Call regarding Seen in 2/6 and had medication changes. 2 hours after taking med she gets a rash Kombiglyze XR. Rash on hands, wrists, and feet. Emergent sx ruled out. Per Rash protocol contact provider within 4 hours. Pt. reports she has called ofice related to this previously and has not gotten response. OFFICE, PATIENT STATES SHE HAS SX EACH TIME SHE TAKES KOMBIGLYZE XR. PLEASE FOLLOW UP WITH HER REGARDING THIS. THANK YOU. Alternate number. [phone] cell. Protocol(s) Used: Rash Recommended Outcome per Protocol: Call Provider within 4 Hours Reason for Outcome: New onset of rash after beginning new prescribed, nonprescribed, or alternative/complementary medication Care Advice: Cool/tepid showers or baths may help relieve itching. If cool water alone does not relieve itching, try adding 1/2 to 1 cup baking soda or colloidal oatmeal (Aveeno) to bath water. ~ 02/

## 2011-08-30 NOTE — Telephone Encounter (Signed)
Dr.Hopper please advise 

## 2011-08-30 NOTE — Telephone Encounter (Signed)
Per Dr.Hopper have patient d/c medication and once patient is clear of rash OV to discuss injectable therapy   Left message on voicemail for patient to return call when available

## 2011-08-31 NOTE — Telephone Encounter (Signed)
Pt return call, Left message to call office.

## 2011-08-31 NOTE — Telephone Encounter (Signed)
Discuss with patient, Appt scheduled.

## 2011-09-01 ENCOUNTER — Ambulatory Visit (INDEPENDENT_AMBULATORY_CARE_PROVIDER_SITE_OTHER): Payer: 59 | Admitting: Internal Medicine

## 2011-09-01 VITALS — BP 150/78 | HR 95 | Temp 98.8°F | Ht 68.5 in | Wt 236.0 lb

## 2011-09-01 MED ORDER — LIRAGLUTIDE 18 MG/3ML ~~LOC~~ SOLN: SUBCUTANEOUS | Status: DC

## 2011-09-01 NOTE — Patient Instructions (Signed)
The most common cause of elevated triglycerides is the ingestion of sugar from high fructose corn syrup sources added to processed foods & drinks.  Eat a low-fat diet with lots of fruits and vegetables, up to 7-9 servings per day. Consume less than 30 (preferably ZERO) grams of sugar per day from foods & drinks with High Fructose Corn Syrup (HFCS) sugar as #1,2,3 or # 4 on label.Whole Foods, [employer] [place] do not carry products with HFCS. Follow a  low carb nutrition program such as [place] or [employer]  to prevent Diabetes progression . White carbohydrates (potatoes, rice, bread, and pasta) have a high spike of sugar and a high load of sugar. For example a  baked potato has a cup of sugar and a  french fry  2 teaspoons of sugar. Yams, wild  rice, whole grained bread &  wheat pasta have been much lower spike and load of  sugar. Portions should be the size of a deck of cards or your palm.  PLEASE BRING THESE INSTRUCTIONS TO FOLLOW UP  LAB APPOINTMENT for A1c in 8 weeks.This will guarantee correct labs are drawn, eliminating need for repeat blood sampling ( needle sticks ! ). Diagnoses /Codes: 250.02

## 2011-09-01 NOTE — Progress Notes (Signed)
  Subjective:    Patient ID: [name], female    DOB: 1956-07-01, 56 y.o.   MRN: [id number]  HPI   She developed a rash over the dorsum of her hands and feet after the first dose of Kombiglyze. She denied any angioedema symptoms or wheezing. She did have some slight itching around her mouth however.  She had been able to take any Janumet in the  past and metformin by itself without a rash.  Her last A1c was 8.4% on 07/07/11; this would be associated with an average sugar of approximately 195 and 60% long-term risk of premature heart attack or stroke.    Review of Systems     Objective:   Physical Exam   She appears healthy and well-nourished in no distress.  There is no angioedema. Chest is clear without wheezing.  She has no lymphadenopathy about the neck or axilla.  There is a slight pebbly consistency or texture of the dorsum of the hands particularly on the left.  She has no significant edema.        Assessment & Plan:  #1 diabetes, uncontrolled. Goals is  A1c < 7%.  #2 apparent rash due to Saxagliptin  Plan: initiate Vytoza with  followup A1c after 6 weeks.

## 2011-09-06 ENCOUNTER — Encounter: Payer: 59 | Admitting: Cardiology

## 2011-09-07 ENCOUNTER — Other Ambulatory Visit: Payer: Self-pay | Admitting: Cardiology

## 2011-09-07 DIAGNOSIS — I10 Essential (primary) hypertension: Secondary | ICD-10-CM

## 2011-09-07 DIAGNOSIS — R0989 Other specified symptoms and signs involving the circulatory and respiratory systems: Secondary | ICD-10-CM

## 2011-09-08 ENCOUNTER — Telehealth: Payer: Self-pay | Admitting: *Deleted

## 2011-09-08 NOTE — Telephone Encounter (Signed)
Pt wants to know if she needs to continue taking metformin along with victozia. Please advise

## 2011-09-11 ENCOUNTER — Encounter (INDEPENDENT_AMBULATORY_CARE_PROVIDER_SITE_OTHER): Payer: 59

## 2011-09-11 ENCOUNTER — Encounter: Payer: 59 | Admitting: Cardiology

## 2011-09-11 DIAGNOSIS — R0989 Other specified symptoms and signs involving the circulatory and respiratory systems: Secondary | ICD-10-CM

## 2011-09-11 DIAGNOSIS — I10 Essential (primary) hypertension: Secondary | ICD-10-CM

## 2011-09-11 DIAGNOSIS — I6529 Occlusion and stenosis of unspecified carotid artery: Secondary | ICD-10-CM

## 2011-09-19 ENCOUNTER — Other Ambulatory Visit: Payer: Self-pay | Admitting: *Deleted

## 2011-09-19 MED ORDER — INSULIN PEN NEEDLE 32G X 5 MM MISC: 1.0000 | Freq: Every day | Status: DC

## 2011-09-19 NOTE — Telephone Encounter (Signed)
Left message to call office

## 2011-09-19 NOTE — Telephone Encounter (Signed)
Rx sent 

## 2011-09-19 NOTE — Telephone Encounter (Signed)
Yes ; both should be continued until A1c is < 7%.

## 2011-09-20 NOTE — Telephone Encounter (Signed)
Left message to call office

## 2011-09-21 MED ORDER — METFORMIN HCL 1000 MG PO TABS: 1000.0000 mg | ORAL_TABLET | Freq: Two times a day (BID) | ORAL | Status: DC

## 2011-09-21 NOTE — Telephone Encounter (Signed)
Discuss with patient  

## 2011-09-28 ENCOUNTER — Other Ambulatory Visit: Payer: Self-pay | Admitting: Internal Medicine

## 2011-10-03 ENCOUNTER — Other Ambulatory Visit: Payer: Self-pay | Admitting: Internal Medicine

## 2011-10-12 ENCOUNTER — Other Ambulatory Visit: Payer: Self-pay | Admitting: Internal Medicine

## 2011-10-27 ENCOUNTER — Other Ambulatory Visit: Payer: 59

## 2011-11-01 ENCOUNTER — Other Ambulatory Visit (INDEPENDENT_AMBULATORY_CARE_PROVIDER_SITE_OTHER): Payer: 59

## 2011-11-01 DIAGNOSIS — IMO0001 Reserved for inherently not codable concepts without codable children: Secondary | ICD-10-CM

## 2011-11-06 ENCOUNTER — Other Ambulatory Visit: Payer: Self-pay | Admitting: Internal Medicine

## 2011-11-16 ENCOUNTER — Telehealth: Payer: Self-pay | Admitting: Internal Medicine

## 2011-11-16 MED ORDER — LIRAGLUTIDE 18 MG/3ML ~~LOC~~ SOLN: SUBCUTANEOUS | Status: DC

## 2011-11-16 NOTE — Telephone Encounter (Signed)
RX sent

## 2011-11-16 NOTE — Telephone Encounter (Signed)
Refill: Victoza pen 20m/ml inj. Use 0.6 mg once a day as directed for 7 days. Then use 1.2 mg once a day. Qty 6.

## 2011-11-24 ENCOUNTER — Other Ambulatory Visit: Payer: Self-pay | Admitting: Internal Medicine

## 2012-01-02 ENCOUNTER — Other Ambulatory Visit: Payer: Self-pay | Admitting: Internal Medicine

## 2012-01-02 MED ORDER — INSULIN PEN NEEDLE 32G X 5 MM MISC: 1.0000 | Freq: Every day | Status: DC

## 2012-01-02 NOTE — Telephone Encounter (Signed)
refill novo twist 32G tip EA #100,Use one daily as directed, last fill 6.17.13, last OV 2.15.13

## 2012-01-29 ENCOUNTER — Other Ambulatory Visit (INDEPENDENT_AMBULATORY_CARE_PROVIDER_SITE_OTHER): Payer: 59

## 2012-01-29 DIAGNOSIS — E119 Type 2 diabetes mellitus without complications: Secondary | ICD-10-CM

## 2012-01-30 NOTE — Progress Notes (Signed)
Lab only 

## 2012-01-31 ENCOUNTER — Ambulatory Visit (INDEPENDENT_AMBULATORY_CARE_PROVIDER_SITE_OTHER): Payer: 59 | Admitting: Internal Medicine

## 2012-01-31 VITALS — BP 124/72 | HR 92 | Temp 98.3°F | Wt 229.8 lb

## 2012-01-31 DIAGNOSIS — J029 Acute pharyngitis, unspecified: Secondary | ICD-10-CM

## 2012-01-31 LAB — POCT RAPID STREP A (OFFICE): Rapid Strep A Screen: NEGATIVE

## 2012-01-31 MED ORDER — LIRAGLUTIDE 18 MG/3ML ~~LOC~~ SOLN: SUBCUTANEOUS | Status: DC

## 2012-01-31 MED ORDER — AMOXICILLIN 500 MG PO CAPS: 500.0000 mg | ORAL_CAPSULE | Freq: Three times a day (TID) | ORAL | Status: AC

## 2012-01-31 NOTE — Patient Instructions (Addendum)
Eat a low-fat diet with lots of fruits and vegetables, up to 7-9 servings per day. Consume less than 30 (preferably ZERO) grams of sugar per day from foods & drinks with High Fructose Corn Syrup (HFCS) sugar as #1,2,3 or # 4 on label.Whole Foods, [employer] [place] do not carry products with HFCS. Follow a  low carb nutrition program such as [place] or [employer]  to prevent Diabetes progression . White carbohydrates (potatoes, rice, bread, and pasta) have a high spike of sugar and a high load of sugar. For example a  baked potato has a cup of sugar and a  french fry  2 teaspoons of sugar. Yams, wild  rice, whole grained bread &  wheat pasta have been much lower spike and load of  sugar. Portions should be the size of a deck of cards or your palm.  Please  schedule fasting Labs : BMET,Lipids, AST,ALT, TSH, A1c , urine microalbumin in 10 weeks. PLEASE BRING THESE INSTRUCTIONS TO FOLLOW UP  LAB APPOINTMENT.This will guarantee correct labs are drawn, eliminating need for repeat blood sampling ( needle sticks ! ). Diagnoses /Codes:250.02; 995.20 Zicam Melts or Zinc lozenges ; vitamin C 2000 mg daily; & Echinacea for 4-7 days. Report fever, exudate("pus") or progressive pain.  Please try to go on My Chart within the next 24 hours to enable me to release future results directly to you.

## 2012-01-31 NOTE — Progress Notes (Signed)
Subjective:    Patient ID: [name], female    DOB: 06/11/1956, 56 y.o.   MRN: [id number]  HPI Respiratory tract infection Symptoms began 01/29/12 as a sore throat which was progressive. This was associated with discomfort under the ears. There was no associated otic discharge. She used over-the-counter meds, nonsteroidals, and gargled with minimal response At this time she denies fever, chills, or sweats. She's not had associated frontal headache, facial pain, dental pain, or purulent nasal secretions.  There have been no pulmonary symptoms such as wheezing, shortness of breath, or productive cough. Extrinsic symptoms were not a prominent part of the present illness.  She quit smoking in 1982. She has no history of asthma          Review of Systems Diabetes status assessment: Fasting or morning glucose range: 117-135 . Highest glucose 2 hours after any meal:  Not checked regularly. Hypoglycemia : once only in 60s late afternoon .                                                                      Significant change in  Weight : down 5 #. Vision changes : no; Ophth exam 5/13, no retinopathy  .                                                                    Exercise :walking some  . Nutrition/diet:  No dietary plan. Medication compliance : yes.  A1c/ urine microalbumin monitor:  A1c 7.6% (average sugar 172;risk  long-term is 52%) She is not having chest pain, palpitations, claudication or  numbness, tingling,or burning in her limbs. She also denies blurred vision, double vision, or loss of vision. There are  no new skin lesions or ulcers.        Objective:   Physical Exam General appearance:good health ;well nourished; no acute distress or increased work of breathing is present.  No  lymphadenopathy about the head, neck, or axilla noted.   Eyes: No conjunctival inflammation or lid edema is present. There is no scleral icterus.  Ears:  External ear exam shows no  significant lesions or deformities.  Otoscopic examination reveals clear canals, tympanic membranes are intact bilaterally without bulging, retraction, inflammation or discharge.  Nose:  External nasal examination shows no deformity or inflammation. Nasal mucosa are pink and moist without lesions or exudates. No septal dislocation or deviation.No obstruction to airflow.   Oral exam: Dental hygiene is good; lips and gums are healthy appearing.There is mild oropharyngeal erythema ; no exudate noted.   Neck:  No deformities, thyromegaly, masses, or tenderness noted.   Supple with full range of motion without pain.   Heart:  Normal rate and regular rhythm. S1 and S2 normal without gallop, murmur, click, rub S4 w/o  extra sounds.   Lungs:Chest clear to auscultation; no wheezes, rhonchi,rales ,or rubs present.No increased work of breathing.    Extremities:  No cyanosis, edema, or clubbing  noted    Skin: Warm & dry w/o  jaundice or tenting.          Assessment & Plan:  #1 pharyngitis  #2 diabetes, poorly controlled.  Plan see orders and recommendations .Nutritional intervention was stressed. A new glucometer was provided as I question the veracity of her glucose recordings. Victoza dose was increased

## 2012-02-04 ENCOUNTER — Encounter: Payer: Self-pay | Admitting: Internal Medicine

## 2012-03-05 ENCOUNTER — Telehealth: Payer: Self-pay | Admitting: Internal Medicine

## 2012-03-05 MED ORDER — LIRAGLUTIDE 18 MG/3ML ~~LOC~~ SOLN: SUBCUTANEOUS | Status: DC

## 2012-03-05 NOTE — Telephone Encounter (Signed)
RX sent for 19m

## 2012-03-05 NOTE — Telephone Encounter (Signed)
new rx victoza pen 24m/ml inj #6, last fill 7.29.13 Inject 1.840mdaily  HAND WRT NOTE FROM PHARMACY STATES: Pt needs the 3-per box for a 30-day supply - please send new rx for [id number]  Last ov 7.17.13 - acute visit & F/U

## 2012-04-05 ENCOUNTER — Ambulatory Visit (INDEPENDENT_AMBULATORY_CARE_PROVIDER_SITE_OTHER): Payer: 59 | Admitting: Internal Medicine

## 2012-04-05 ENCOUNTER — Encounter: Payer: Self-pay | Admitting: Internal Medicine

## 2012-04-05 VITALS — BP 130/82 | HR 97 | Temp 98.2°F | Resp 14 | Ht 69.03 in | Wt 227.8 lb

## 2012-04-05 DIAGNOSIS — Z0000 Encounter for general adult medical examination without abnormal findings: Secondary | ICD-10-CM

## 2012-04-05 LAB — BASIC METABOLIC PANEL
BUN: 16 mg/dL (ref 6–23)
Chloride: 105 mEq/L (ref 96–112)
Creatinine, Ser: 0.9 mg/dL (ref 0.4–1.2)
Glucose, Bld: 90 mg/dL (ref 70–99)

## 2012-04-05 LAB — CBC WITH DIFFERENTIAL/PLATELET
Basophils Absolute: 0 10*3/uL (ref 0.0–0.1)
Eosinophils Absolute: 0.4 10*3/uL (ref 0.0–0.7)
Eosinophils Relative: 4.1 % (ref 0.0–5.0)
HCT: 45 % (ref 36.0–46.0)
Lymphs Abs: 2.8 10*3/uL (ref 0.7–4.0)
MCHC: 32.7 g/dL (ref 30.0–36.0)
MCV: 94.1 fl (ref 78.0–100.0)
Monocytes Absolute: 0.4 10*3/uL (ref 0.1–1.0)
Neutrophils Relative %: 60.2 % (ref 43.0–77.0)
Platelets: 131 10*3/uL — ABNORMAL LOW (ref 150.0–400.0)
RDW: 12.4 % (ref 11.5–14.6)
WBC: 9.1 10*3/uL (ref 4.5–10.5)

## 2012-04-05 LAB — LIPID PANEL
Cholesterol: 159 mg/dL (ref 0–200)
HDL: 46.8 mg/dL (ref >39.00)
LDL Cholesterol: 100 mg/dL — ABNORMAL HIGH (ref 0–99)
Triglycerides: 63 mg/dL (ref 0.0–149.0)
VLDL: 12.6 mg/dL (ref 0.0–40.0)

## 2012-04-05 LAB — HEMOGLOBIN A1C: Hgb A1c MFr Bld: 6.7 % — ABNORMAL HIGH (ref 4.6–6.5)

## 2012-04-05 LAB — HEPATIC FUNCTION PANEL
Albumin: 4.3 g/dL (ref 3.5–5.2)
Total Bilirubin: 0.8 mg/dL (ref 0.3–1.2)

## 2012-04-05 LAB — MICROALBUMIN / CREATININE URINE RATIO: Creatinine,U: 298.5 mg/dL

## 2012-04-05 NOTE — Progress Notes (Signed)
  Subjective:    Patient ID: [name], female    DOB: 1956-06-07, 56 y.o.   MRN: [id number]  HPI  Brylea is here for a physical;acute issues include familial stress which is being actively addressed.      Review of Systems HYPERTENSION: Disease Monitoring: Blood pressure range- not monitored but normal @ appts  Chest pain, palpitations- no      Dyspnea- no Medications: Compliance- yes Lightheadedness,Syncope-no   Edema- no  DIABETES: Disease Monitoring: Blood Sugar ranges- 94-130; high post meal < 160 Polyuria/phagia/dipsia- no       Visual problems- no; last Ophth exam 5/13, no retinopathy then Podiatry F/U: no neuropathy Medications: Compliance- no  Hypoglycemic symptoms-no  HYPERLIPIDEMIA: Disease Monitoring: See symptoms for Hypertension Medications: Compliance- yes Abd pain, bowel changes- no Muscle aches-no but some arthralgias          Objective:   Physical Exam Gen.:  well-nourished in appearance. Alert, appropriate and cooperative throughout exam. Head: Normocephalic without obvious abnormalities. Eyes: No corneal or conjunctival inflammation noted. Pupils equal round reactive to light and accommodation. Fundal exam is benign without hemorrhages, exudate, papilledema. Extraocular motion intact. Vision grossly normal. Ears: External  ear exam reveals no significant lesions or deformities. Canals clear .TMs normal. Hearing is grossly normal bilaterally. Nose: External nasal exam reveals no deformity or inflammation. Nasal mucosa are pink and moist. No lesions or exudates noted.  Mouth: Oral mucosa and oropharynx reveal no lesions or exudates. Teeth in good repair. Neck: No deformities, masses, or tenderness noted. Range of motion & Thyroid normal Lungs: Normal respiratory effort; chest expands symmetrically. Lungs are clear to auscultation without rales, wheezes, or increased work of breathing. Heart: Normal rate and rhythm. Normal S1 and S2. No gallop,  click, or rub. S4 w/o murmur. Abdomen: Bowel sounds normal; abdomen soft and nontender. No masses, organomegaly or hernias noted. Genitalia: Dr Bobbye Charleston, Gyn                                                          Musculoskeletal/extremities: No deformity or scoliosis noted of  the thoracic or lumbar spine. No clubbing, cyanosis, edema, or deformity noted. Range of motion  normal .Tone & strength  normal.Joints normal. Nail health  good. Vascular: Carotid, radial artery, dorsalis pedis and  posterior tibial pulses are full and equal. No bruits present. Neurologic: Alert and oriented x3. Deep tendon reflexes symmetrical and normal.          Skin: Intact without suspicious lesions or rashes. Lymph: No cervical, axillary lymphadenopathy present. Psych: Mood and affect are normal. Normally interactive                                                                                         Assessment & Plan:  #1 comprehensive physical exam; no acute findings #2 see Problem List with Assessments & Recommendations Plan: see Orders

## 2012-04-05 NOTE — Patient Instructions (Addendum)
Preventive Health Care: Exercise  30-45  minutes a day, 3-4 days a week. Walking is especially valuable in preventing Osteoporosis. Eat a low-fat diet with lots of fruits and vegetables, up to 7-9 servings per day. Your goal = waist less than 35 inches. Eye Doctor - have an eye exam @ least annually [place] Will place you in charge of your health care  decisions. Verify these are  in place.  If you activate My Chart; the results can be released to you as soon as they populate from the lab. If you choose not to use this program; the labs have to be reviewed, copied & mailed   causing a delay in getting the results to you.

## 2012-04-09 ENCOUNTER — Telehealth: Payer: Self-pay | Admitting: Internal Medicine

## 2012-04-09 MED ORDER — HYDROCHLOROTHIAZIDE 25 MG PO TABS: 25.0000 mg | ORAL_TABLET | ORAL | Status: DC

## 2012-04-09 NOTE — Telephone Encounter (Signed)
I called and left message on voicemail informing patient to call to discuss request for Zetia, HCTZ rx sent in

## 2012-04-09 NOTE — Telephone Encounter (Signed)
Refill: hydrochlorothiazide 40m tab. Take one-half tablet by mouth daily. Qty 472 Last fill 9.24.13  Refill: Zetia 10 mg tab. Take 1 tablet by mouth daily. Qty 30. Last fill 8.26.13

## 2012-04-12 MED ORDER — EZETIMIBE 10 MG PO TABS: 10.0000 mg | ORAL_TABLET | Freq: Every day | ORAL | Status: DC

## 2012-04-12 NOTE — Telephone Encounter (Signed)
Spoke with patient, patient states she is taking zetia, patient states this may have been removed from her med list in error

## 2012-04-20 ENCOUNTER — Encounter: Payer: Self-pay | Admitting: Internal Medicine

## 2012-04-22 ENCOUNTER — Encounter: Payer: Self-pay | Admitting: Internal Medicine

## 2012-05-01 ENCOUNTER — Other Ambulatory Visit: Payer: Self-pay | Admitting: Internal Medicine

## 2012-05-01 NOTE — Telephone Encounter (Signed)
rx sent to pharmacy by e-script  

## 2012-05-24 ENCOUNTER — Other Ambulatory Visit: Payer: Self-pay | Admitting: Internal Medicine

## 2012-06-09 ENCOUNTER — Other Ambulatory Visit: Payer: Self-pay | Admitting: Internal Medicine

## 2012-07-06 ENCOUNTER — Other Ambulatory Visit: Payer: Self-pay | Admitting: Internal Medicine

## 2012-07-12 ENCOUNTER — Telehealth: Payer: Self-pay | Admitting: Internal Medicine

## 2012-07-12 NOTE — Telephone Encounter (Signed)
Refill: hydrochlorothiazide 25 mg tab. Take one-half tablet by mouth daily. Qty 76. Last fill 04-09-12

## 2012-07-12 NOTE — Telephone Encounter (Signed)
Spoke with paul at pharmacy, pt picked up refill yesterday. Refill request sent in error.

## 2012-08-02 ENCOUNTER — Other Ambulatory Visit: Payer: Self-pay | Admitting: Internal Medicine

## 2012-08-02 NOTE — Telephone Encounter (Signed)
A1c 250.00

## 2012-08-31 ENCOUNTER — Other Ambulatory Visit: Payer: Self-pay

## 2012-09-09 ENCOUNTER — Other Ambulatory Visit: Payer: Self-pay | Admitting: Internal Medicine

## 2012-09-11 ENCOUNTER — Other Ambulatory Visit: Payer: Self-pay | Admitting: Internal Medicine

## 2012-09-11 NOTE — Telephone Encounter (Signed)
a1c 250.00

## 2012-09-13 ENCOUNTER — Other Ambulatory Visit: Payer: Self-pay | Admitting: Internal Medicine

## 2012-09-16 NOTE — Telephone Encounter (Signed)
a1c 250.00

## 2012-10-30 ENCOUNTER — Other Ambulatory Visit (INDEPENDENT_AMBULATORY_CARE_PROVIDER_SITE_OTHER): Payer: 59

## 2012-10-30 DIAGNOSIS — E119 Type 2 diabetes mellitus without complications: Secondary | ICD-10-CM

## 2012-10-30 LAB — HEMOGLOBIN A1C: Hgb A1c MFr Bld: 7.2 % — ABNORMAL HIGH (ref 4.6–6.5)

## 2012-10-31 ENCOUNTER — Other Ambulatory Visit: Payer: Self-pay | Admitting: Internal Medicine

## 2012-11-12 ENCOUNTER — Other Ambulatory Visit: Payer: Self-pay | Admitting: Internal Medicine

## 2012-11-18 ENCOUNTER — Other Ambulatory Visit: Payer: Self-pay | Admitting: Internal Medicine

## 2012-12-26 ENCOUNTER — Telehealth: Payer: Self-pay | Admitting: Internal Medicine

## 2012-12-26 MED ORDER — LIRAGLUTIDE 18 MG/3ML ~~LOC~~ SOPN: 1.8000 mg | PEN_INJECTOR | Freq: Every day | SUBCUTANEOUS | Status: DC

## 2012-12-26 NOTE — Telephone Encounter (Signed)
Patient states she needs new rx for victoza. We gave her samples and the prescription is now expired. Patient states she is completely out of this medication. [facility]

## 2012-12-26 NOTE — Telephone Encounter (Signed)
Rx sent 

## 2013-01-30 ENCOUNTER — Other Ambulatory Visit: Payer: Self-pay | Admitting: Internal Medicine

## 2013-02-04 ENCOUNTER — Other Ambulatory Visit: Payer: Self-pay | Admitting: Internal Medicine

## 2013-02-04 NOTE — Telephone Encounter (Signed)
A1c 250.00

## 2013-03-09 ENCOUNTER — Other Ambulatory Visit: Payer: Self-pay | Admitting: Internal Medicine

## 2013-03-12 NOTE — Telephone Encounter (Signed)
Med filled. Pt due for labs called and LMOVM to call and schedule.

## 2013-03-24 ENCOUNTER — Other Ambulatory Visit: Payer: Self-pay | Admitting: Internal Medicine

## 2013-04-16 ENCOUNTER — Telehealth: Payer: Self-pay | Admitting: Internal Medicine

## 2013-04-16 NOTE — Telephone Encounter (Signed)
Patient is calling in regards to her refill request for rx: ezetimibe (ZETIA) 10 MG tablet. States that she has been out of it since Sunday.

## 2013-04-17 ENCOUNTER — Other Ambulatory Visit: Payer: Self-pay | Admitting: *Deleted

## 2013-04-17 ENCOUNTER — Telehealth: Payer: Self-pay | Admitting: *Deleted

## 2013-04-17 DIAGNOSIS — E785 Hyperlipidemia, unspecified: Secondary | ICD-10-CM

## 2013-04-17 MED ORDER — EZETIMIBE 10 MG PO TABS: 10.0000 mg | ORAL_TABLET | Freq: Every day | ORAL | Status: DC

## 2013-04-17 NOTE — Telephone Encounter (Signed)
Spoke with pharmacy, they received the refill that was sent this morning. Pt made aware.

## 2013-04-17 NOTE — Telephone Encounter (Signed)
Confirmed that Rx was received with pharmacy and pt made aware as well.

## 2013-04-17 NOTE — Telephone Encounter (Signed)
Refill for Zetia sent to [facility] on [address]

## 2013-04-18 ENCOUNTER — Other Ambulatory Visit: Payer: Self-pay | Admitting: Internal Medicine

## 2013-04-21 ENCOUNTER — Telehealth: Payer: Self-pay | Admitting: *Deleted

## 2013-04-21 ENCOUNTER — Other Ambulatory Visit: Payer: Self-pay | Admitting: *Deleted

## 2013-04-21 MED ORDER — METFORMIN HCL 1000 MG PO TABS: ORAL_TABLET | ORAL | Status: DC

## 2013-04-21 NOTE — Telephone Encounter (Signed)
Metformin refill sent to pharmacy

## 2013-04-21 NOTE — Telephone Encounter (Signed)
Patient is requesting refill on Metformin. Last refill was 03/09/2013 and she was overdue for labs and OV then. She has scheduled an appt for 06/02/2013. Please advise.

## 2013-04-21 NOTE — Telephone Encounter (Signed)
OK to refill as OV scheduled

## 2013-04-23 ENCOUNTER — Ambulatory Visit: Payer: 59 | Admitting: Internal Medicine

## 2013-04-23 ENCOUNTER — Other Ambulatory Visit: Payer: 59

## 2013-04-25 ENCOUNTER — Other Ambulatory Visit: Payer: Self-pay | Admitting: Internal Medicine

## 2013-04-25 NOTE — Telephone Encounter (Signed)
Refill for glimepiride sent to pharmacy

## 2013-04-29 ENCOUNTER — Encounter: Payer: Self-pay | Admitting: Internal Medicine

## 2013-04-29 ENCOUNTER — Other Ambulatory Visit: Payer: Self-pay | Admitting: Internal Medicine

## 2013-04-29 NOTE — Telephone Encounter (Signed)
HCTZ sent to pharmacy

## 2013-05-01 ENCOUNTER — Other Ambulatory Visit (INDEPENDENT_AMBULATORY_CARE_PROVIDER_SITE_OTHER): Payer: 59

## 2013-05-01 DIAGNOSIS — E119 Type 2 diabetes mellitus without complications: Secondary | ICD-10-CM

## 2013-05-17 LAB — HM DIABETES EYE EXAM

## 2013-05-22 ENCOUNTER — Other Ambulatory Visit: Payer: Self-pay

## 2013-05-23 ENCOUNTER — Other Ambulatory Visit: Payer: Self-pay | Admitting: Internal Medicine

## 2013-05-26 NOTE — Telephone Encounter (Signed)
Glimepiride refill sent to pharmacy

## 2013-05-29 ENCOUNTER — Other Ambulatory Visit: Payer: Self-pay | Admitting: Internal Medicine

## 2013-05-29 NOTE — Telephone Encounter (Signed)
Metformin refilled

## 2013-05-30 ENCOUNTER — Telehealth: Payer: Self-pay

## 2013-05-30 NOTE — Telephone Encounter (Signed)
Left message for call back Non identifiable [facility]--10/2010 Gyn--Dr Philis Pique [facility]--10/04/2007--neg Flu Vaccine?

## 2013-06-02 ENCOUNTER — Ambulatory Visit (INDEPENDENT_AMBULATORY_CARE_PROVIDER_SITE_OTHER): Payer: 59 | Admitting: Internal Medicine

## 2013-06-02 ENCOUNTER — Encounter: Payer: Self-pay | Admitting: Internal Medicine

## 2013-06-02 VITALS — BP 143/77 | HR 98 | Temp 98.6°F | Ht 69.25 in | Wt 229.6 lb

## 2013-06-02 DIAGNOSIS — Z0000 Encounter for general adult medical examination without abnormal findings: Secondary | ICD-10-CM

## 2013-06-02 DIAGNOSIS — IMO0001 Reserved for inherently not codable concepts without codable children: Secondary | ICD-10-CM

## 2013-06-02 MED ORDER — DAPAGLIFLOZIN PROPANEDIOL 5 MG PO TABS: 5.0000 mg | ORAL_TABLET | Freq: Every day | ORAL | Status: DC

## 2013-06-02 NOTE — Progress Notes (Signed)
Pre visit review using our clinic review tool, if applicable. No additional management support is needed unless otherwise documented below in the visit note. 

## 2013-06-02 NOTE — Patient Instructions (Signed)
Your next office appointment will be determined based upon review of your pending labs. Those instructions will be transmitted to you through My Chart . 

## 2013-06-02 NOTE — Progress Notes (Signed)
  Subjective:    Patient ID: [name], female    DOB: May 09, 1956, 57 y.o.   MRN: [id number]  HPI Zelta is here for a physical;acute issues denied.     Review of Systems Diabetes: Disease monitoring:A1c 7.2 % last month Blood sugar ranges/average: 112- 125 Highest glucose two hours post prandially:<180 Ophthalmologic exam: current ; no retinopathy Podiatry exam:not current Medication compliance:not always Hypoglycemia:no  Hyperlipidemia: Disease monitoring: LDL past due Medication compliance:yes  Hypertension: Disease monitoring:  Blood pressure average:134/78 Medication compliance:yes  Diet/nutrition:avoids red meat Exercise:@ [employer] 2 x/week Salt restriction:no  Review of systems:  Chest pain, palpitations:no Dyspnea:no Lightheadedness, syncope:no Edema:no Claudication:no Polydipsia/polyphagia/polyuria:no Blurred vision/diplopia/loss of vision:no Skin lesions:no Numbness/tingling/pain and extremities:no Abdominal pain/bowel change:no Myalgias:no     Objective:   Physical Exam Gen.:  well-nourished in appearance. Alert, appropriate and cooperative throughout exam.Appears younger than stated age  Head: Normocephalic without obvious abnormalities  Eyes: No corneal or conjunctival inflammation noted. Pupils equal round reactive to light and accommodation. Extraocular motion intact.  Ears: External  ear exam reveals no significant lesions or deformities. Canals clear .TMs normal. Hearing is grossly normal bilaterally. Nose: External nasal exam reveals no deformity or inflammation. Nasal mucosa are pink and moist. No lesions or exudates noted.   Mouth: Oral mucosa and oropharynx reveal no lesions or exudates. Teeth in good repair. Neck: No deformities, masses, or tenderness noted. Range of motion & Thyroid normal with physiologic asymmetric. Lungs: Normal respiratory effort; chest expands symmetrically. Lungs are clear to auscultation without rales, wheezes, or  increased work of breathing. Heart: Normal rate and rhythm. Normal S1 and S2. No gallop, click, or rub. S4 w/o  murmur. Abdomen: Bowel sounds normal; abdomen soft and nontender. No masses, organomegaly or hernias noted. Genitalia:  as per Gyn                                  Musculoskeletal/extremities: No deformity or scoliosis noted of  the thoracic or lumbar spine.  No clubbing, cyanosis, edema, or significant extremity  deformity noted. Range of motion normal .Tone & strength normal. Hand joints normal . Fingernail / toenail health good. Able to lie down & sit up w/o help. Negative SLR bilaterally Vascular: Carotid, radial artery, dorsalis pedis and  posterior tibial pulses are full and equal. No bruits present. Neurologic: Alert and oriented x3. Deep tendon reflexes symmetrical and normal.   Light touch normal over feet.      Skin: Intact without suspicious lesions or rashes. Several tatooes Lymph: No cervical, axillary lymphadenopathy present. Psych: Mood and affect are normal. Normally interactive                                                                                        Assessment & Plan:  #1 comprehensive physical exam; no acute findings  Plan: see Orders  & Recommendations

## 2013-06-03 NOTE — Telephone Encounter (Signed)
Unable to reach pre visit.  

## 2013-06-06 ENCOUNTER — Other Ambulatory Visit: Payer: Self-pay | Admitting: Internal Medicine

## 2013-06-08 NOTE — Telephone Encounter (Signed)
Metoprolol refilled per protocol

## 2013-06-10 ENCOUNTER — Ambulatory Visit: Payer: 59

## 2013-06-10 ENCOUNTER — Other Ambulatory Visit (INDEPENDENT_AMBULATORY_CARE_PROVIDER_SITE_OTHER): Payer: 59

## 2013-06-10 ENCOUNTER — Ambulatory Visit (INDEPENDENT_AMBULATORY_CARE_PROVIDER_SITE_OTHER): Payer: 59 | Admitting: *Deleted

## 2013-06-10 DIAGNOSIS — Z0000 Encounter for general adult medical examination without abnormal findings: Secondary | ICD-10-CM

## 2013-06-10 DIAGNOSIS — Z23 Encounter for immunization: Secondary | ICD-10-CM

## 2013-06-10 LAB — BASIC METABOLIC PANEL
CO2: 23 mEq/L (ref 19–32)
Calcium: 9.6 mg/dL (ref 8.4–10.5)
Creatinine, Ser: 1 mg/dL (ref 0.4–1.2)
GFR: 70.91 mL/min (ref >60.00)
Glucose, Bld: 130 mg/dL — ABNORMAL HIGH (ref 70–99)
Potassium: 4 mEq/L (ref 3.5–5.1)
Sodium: 138 mEq/L (ref 135–145)

## 2013-06-10 LAB — LIPID PANEL
Cholesterol: 162 mg/dL (ref 0–200)
HDL: 52.6 mg/dL (ref >39.00)
Total CHOL/HDL Ratio: 3

## 2013-06-10 LAB — CBC WITH DIFFERENTIAL/PLATELET
Basophils Absolute: 0 10*3/uL (ref 0.0–0.1)
Basophils Relative: 0.4 % (ref 0.0–3.0)
Eosinophils Relative: 6.6 % — ABNORMAL HIGH (ref 0.0–5.0)
HCT: 39.5 % (ref 36.0–46.0)
Hemoglobin: 13.1 g/dL (ref 12.0–15.0)
Lymphocytes Relative: 29.8 % (ref 12.0–46.0)
Lymphs Abs: 2.5 10*3/uL (ref 0.7–4.0)
MCV: 91.4 fl (ref 78.0–100.0)
Monocytes Absolute: 0.5 10*3/uL (ref 0.1–1.0)
Neutro Abs: 4.8 10*3/uL (ref 1.4–7.7)
Neutrophils Relative %: 57.5 % (ref 43.0–77.0)
RBC: 4.32 Mil/uL (ref 3.87–5.11)
RDW: 13.1 % (ref 11.5–14.6)
WBC: 8.3 10*3/uL (ref 4.5–10.5)

## 2013-06-10 LAB — HEPATIC FUNCTION PANEL
ALT: 45 U/L — ABNORMAL HIGH (ref 0–35)
Alkaline Phosphatase: 75 U/L (ref 39–117)
Bilirubin, Direct: 0.1 mg/dL (ref 0.0–0.3)
Total Protein: 8.2 g/dL (ref 6.0–8.3)

## 2013-06-10 LAB — TSH: TSH: 0.9 u[IU]/mL (ref 0.35–5.50)

## 2013-06-17 ENCOUNTER — Other Ambulatory Visit: Payer: Self-pay | Admitting: Internal Medicine

## 2013-06-17 NOTE — Telephone Encounter (Signed)
HCTZ refilled per protocol

## 2013-06-18 NOTE — Telephone Encounter (Signed)
Phoned-in rx's to [facility]([place]).//AB/CMA

## 2013-06-24 ENCOUNTER — Other Ambulatory Visit: Payer: Self-pay | Admitting: Internal Medicine

## 2013-06-25 NOTE — Telephone Encounter (Signed)
Glimepiride refilled per protocol

## 2013-07-13 ENCOUNTER — Other Ambulatory Visit: Payer: Self-pay | Admitting: Internal Medicine

## 2013-07-14 NOTE — Telephone Encounter (Signed)
Atorvastatin refilled per protocol. JG//CMA

## 2013-08-12 ENCOUNTER — Other Ambulatory Visit: Payer: Self-pay | Admitting: Internal Medicine

## 2013-08-13 ENCOUNTER — Encounter: Payer: Self-pay | Admitting: Internal Medicine

## 2013-08-13 NOTE — Telephone Encounter (Signed)
Benazepril refilled per protocol. JG//CMA

## 2013-08-26 ENCOUNTER — Other Ambulatory Visit: Payer: Self-pay | Admitting: Internal Medicine

## 2013-08-26 NOTE — Telephone Encounter (Signed)
Rx denied because it was refilled on (08-12-13).//AB/CMA

## 2013-09-06 ENCOUNTER — Other Ambulatory Visit: Payer: Self-pay | Admitting: Internal Medicine

## 2013-09-06 DIAGNOSIS — I10 Essential (primary) hypertension: Secondary | ICD-10-CM

## 2013-09-08 NOTE — Telephone Encounter (Signed)
Refill for lopressor sent to [facility] on [address]

## 2013-10-05 ENCOUNTER — Other Ambulatory Visit: Payer: Self-pay | Admitting: Internal Medicine

## 2013-10-15 ENCOUNTER — Other Ambulatory Visit: Payer: Self-pay | Admitting: Internal Medicine

## 2013-10-23 ENCOUNTER — Other Ambulatory Visit: Payer: Self-pay

## 2013-10-30 ENCOUNTER — Other Ambulatory Visit: Payer: Self-pay | Admitting: Internal Medicine

## 2013-10-30 ENCOUNTER — Other Ambulatory Visit: Payer: Self-pay | Admitting: Obstetrics and Gynecology

## 2013-10-31 ENCOUNTER — Other Ambulatory Visit: Payer: Self-pay | Admitting: Internal Medicine

## 2013-10-31 ENCOUNTER — Telehealth: Payer: Self-pay | Admitting: Internal Medicine

## 2013-10-31 DIAGNOSIS — E1165 Type 2 diabetes mellitus with hyperglycemia: Principal | ICD-10-CM

## 2013-10-31 DIAGNOSIS — R740 Nonspecific elevation of levels of transaminase and lactic acid dehydrogenase [LDH]: Secondary | ICD-10-CM

## 2013-10-31 DIAGNOSIS — R7402 Elevation of levels of lactic acid dehydrogenase (LDH): Secondary | ICD-10-CM | POA: Insufficient documentation

## 2013-10-31 DIAGNOSIS — R7401 Elevation of levels of liver transaminase levels: Secondary | ICD-10-CM

## 2013-10-31 DIAGNOSIS — IMO0001 Reserved for inherently not codable concepts without codable children: Secondary | ICD-10-CM

## 2013-10-31 DIAGNOSIS — I10 Essential (primary) hypertension: Secondary | ICD-10-CM

## 2013-10-31 NOTE — Telephone Encounter (Signed)
Pt called stated she was suppose to come in and do the lab work 4 month after 06/02/14 appt. A1c, BMET & urine microalbumin. Please advise.  Ok to leave detail massage on phone #.

## 2013-10-31 NOTE — Telephone Encounter (Signed)
Left detail massage for pt to do the lab work and need ov 2-3 days after.

## 2013-10-31 NOTE — Telephone Encounter (Signed)
Orders entered; OV 2-3 days post fasting labs

## 2013-11-07 ENCOUNTER — Other Ambulatory Visit: Payer: Self-pay | Admitting: Internal Medicine

## 2013-11-19 ENCOUNTER — Other Ambulatory Visit: Payer: Self-pay | Admitting: Internal Medicine

## 2013-11-22 ENCOUNTER — Other Ambulatory Visit: Payer: Self-pay | Admitting: Internal Medicine

## 2013-12-19 ENCOUNTER — Other Ambulatory Visit: Payer: Self-pay | Admitting: Internal Medicine

## 2013-12-26 ENCOUNTER — Other Ambulatory Visit: Payer: Self-pay | Admitting: Internal Medicine

## 2013-12-29 ENCOUNTER — Other Ambulatory Visit: Payer: Self-pay | Admitting: Internal Medicine

## 2013-12-29 ENCOUNTER — Telehealth: Payer: Self-pay | Admitting: Internal Medicine

## 2013-12-29 DIAGNOSIS — IMO0001 Reserved for inherently not codable concepts without codable children: Secondary | ICD-10-CM

## 2013-12-29 DIAGNOSIS — E1165 Type 2 diabetes mellitus with hyperglycemia: Principal | ICD-10-CM

## 2013-12-29 NOTE — Telephone Encounter (Signed)
Caller name: Tayli  Call back number:[phone]   Reason for call:  Pt is wanting to have a1c checked and is needing the order placed.  Pt will come to the [place] office to have the lab work done, so please contact to advise.

## 2014-02-04 ENCOUNTER — Other Ambulatory Visit: Payer: Self-pay | Admitting: Internal Medicine

## 2014-02-09 ENCOUNTER — Other Ambulatory Visit: Payer: Self-pay | Admitting: Internal Medicine

## 2014-02-27 ENCOUNTER — Other Ambulatory Visit: Payer: Self-pay | Admitting: Internal Medicine

## 2014-03-27 ENCOUNTER — Telehealth: Payer: Self-pay | Admitting: *Deleted

## 2014-03-27 NOTE — Telephone Encounter (Signed)
Left VM for pt in regards to nurse visit for f/u on BP

## 2014-03-31 ENCOUNTER — Other Ambulatory Visit: Payer: Self-pay | Admitting: Internal Medicine

## 2014-04-08 ENCOUNTER — Telehealth: Payer: Self-pay | Admitting: Internal Medicine

## 2014-04-08 ENCOUNTER — Other Ambulatory Visit: Payer: Self-pay | Admitting: Internal Medicine

## 2014-04-08 DIAGNOSIS — Z0000 Encounter for general adult medical examination without abnormal findings: Secondary | ICD-10-CM

## 2014-04-08 NOTE — Telephone Encounter (Signed)
done

## 2014-04-08 NOTE — Telephone Encounter (Signed)
Pt request lab work a week prior to her physical 06/05/14. Please advise, pt has [employer] for insruance.

## 2014-04-09 NOTE — Telephone Encounter (Signed)
Left vm inform pt that her lab is in the system.

## 2014-04-12 ENCOUNTER — Other Ambulatory Visit: Payer: Self-pay | Admitting: Internal Medicine

## 2014-05-03 ENCOUNTER — Other Ambulatory Visit: Payer: Self-pay | Admitting: Internal Medicine

## 2014-05-10 ENCOUNTER — Other Ambulatory Visit: Payer: Self-pay | Admitting: Internal Medicine

## 2014-05-11 ENCOUNTER — Other Ambulatory Visit: Payer: Self-pay | Admitting: Internal Medicine

## 2014-05-12 ENCOUNTER — Other Ambulatory Visit: Payer: Self-pay | Admitting: Internal Medicine

## 2014-06-03 ENCOUNTER — Other Ambulatory Visit: Payer: Self-pay | Admitting: Internal Medicine

## 2014-06-05 ENCOUNTER — Ambulatory Visit (INDEPENDENT_AMBULATORY_CARE_PROVIDER_SITE_OTHER): Payer: 59 | Admitting: Internal Medicine

## 2014-06-05 ENCOUNTER — Encounter: Payer: Self-pay | Admitting: Internal Medicine

## 2014-06-05 ENCOUNTER — Ambulatory Visit (INDEPENDENT_AMBULATORY_CARE_PROVIDER_SITE_OTHER): Payer: 59

## 2014-06-05 ENCOUNTER — Other Ambulatory Visit (INDEPENDENT_AMBULATORY_CARE_PROVIDER_SITE_OTHER): Payer: 59

## 2014-06-05 VITALS — BP 124/70 | HR 67 | Temp 98.1°F | Resp 12 | Ht 69.0 in | Wt 222.0 lb

## 2014-06-05 DIAGNOSIS — E119 Type 2 diabetes mellitus without complications: Secondary | ICD-10-CM

## 2014-06-05 DIAGNOSIS — E785 Hyperlipidemia, unspecified: Secondary | ICD-10-CM

## 2014-06-05 DIAGNOSIS — Z0000 Encounter for general adult medical examination without abnormal findings: Secondary | ICD-10-CM

## 2014-06-05 DIAGNOSIS — R002 Palpitations: Secondary | ICD-10-CM

## 2014-06-05 DIAGNOSIS — Z23 Encounter for immunization: Secondary | ICD-10-CM

## 2014-06-05 LAB — CBC WITH DIFFERENTIAL/PLATELET
BASOS ABS: 0.1 10*3/uL (ref 0.0–0.1)
BASOS PCT: 0.5 % (ref 0.0–3.0)
Eosinophils Absolute: 0.5 10*3/uL (ref 0.0–0.7)
Eosinophils Relative: 4 % (ref 0.0–5.0)
HCT: 46.8 % — ABNORMAL HIGH (ref 36.0–46.0)
HEMOGLOBIN: 15.4 g/dL — AB (ref 12.0–15.0)
LYMPHS PCT: 31.8 % (ref 12.0–46.0)
Lymphs Abs: 3.6 10*3/uL (ref 0.7–4.0)
MCHC: 32.8 g/dL (ref 30.0–36.0)
MCV: 92 fl (ref 78.0–100.0)
Monocytes Absolute: 0.7 10*3/uL (ref 0.1–1.0)
Monocytes Relative: 6.3 % (ref 3.0–12.0)
NEUTROS ABS: 6.5 10*3/uL (ref 1.4–7.7)
Neutrophils Relative %: 57.4 % (ref 43.0–77.0)
Platelets: 152 10*3/uL (ref 150.0–400.0)
RBC: 5.08 Mil/uL (ref 3.87–5.11)
RDW: 13.4 % (ref 11.5–15.5)
WBC: 11.4 10*3/uL — ABNORMAL HIGH (ref 4.0–10.5)

## 2014-06-05 LAB — LIPID PANEL
CHOL/HDL RATIO: 3
Cholesterol: 176 mg/dL (ref 0–200)
HDL: 52.3 mg/dL (ref >39.00)
LDL Cholesterol: 106 mg/dL — ABNORMAL HIGH (ref 0–99)
NonHDL: 123.7
TRIGLYCERIDES: 90 mg/dL (ref 0.0–149.0)
VLDL: 18 mg/dL (ref 0.0–40.0)

## 2014-06-05 LAB — TSH: TSH: 1.01 u[IU]/mL (ref 0.35–4.50)

## 2014-06-05 LAB — BASIC METABOLIC PANEL
BUN: 18 mg/dL (ref 6–23)
CO2: 25 mEq/L (ref 19–32)
Calcium: 10.1 mg/dL (ref 8.4–10.5)
Chloride: 103 mEq/L (ref 96–112)
Creatinine, Ser: 1.1 mg/dL (ref 0.4–1.2)
GFR: 64.15 mL/min (ref >60.00)
Glucose, Bld: 106 mg/dL — ABNORMAL HIGH (ref 70–99)
Potassium: 4.4 mEq/L (ref 3.5–5.1)
SODIUM: 141 meq/L (ref 135–145)

## 2014-06-05 LAB — MICROALBUMIN / CREATININE URINE RATIO
Creatinine,U: 186.9 mg/dL
Microalb Creat Ratio: 0.5 mg/g (ref 0.0–30.0)
Microalb, Ur: 1 mg/dL (ref 0.0–1.9)

## 2014-06-05 LAB — MAGNESIUM: Magnesium: 1.8 mg/dL (ref 1.5–2.5)

## 2014-06-05 LAB — HEPATIC FUNCTION PANEL
ALT: 42 U/L — AB (ref 0–35)
AST: 51 U/L — ABNORMAL HIGH (ref 0–37)
Albumin: 4.6 g/dL (ref 3.5–5.2)
Alkaline Phosphatase: 79 U/L (ref 39–117)
BILIRUBIN DIRECT: 0.2 mg/dL (ref 0.0–0.3)
Total Bilirubin: 1.2 mg/dL (ref 0.2–1.2)
Total Protein: 8.4 g/dL — ABNORMAL HIGH (ref 6.0–8.3)

## 2014-06-05 LAB — HEMOGLOBIN A1C: Hgb A1c MFr Bld: 6.6 % — ABNORMAL HIGH (ref 4.6–6.5)

## 2014-06-05 NOTE — Progress Notes (Signed)
Subjective:    Patient ID: [name], female    DOB: Aug 24, 1955, 58 y.o.   MRN: [id number]  HPI   She is here for a physical;acute issues denied.  She is compliant with her medications without apparent adverse effects.  Fasting blood sugars average 115; 2 hours after meal high is less than 180.  She has no active diabetic symptoms.  Ophth 05/20/14 negative. No Podiatry exam. She is on no specific diet but has decreased red meat & pork ingestion.She is working out at a gym 2-3 times per week for 1 hour including at least 20 minutes each of cardio & machines. 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 There is no specific claudication however.    Review of Systems   Chest pain, tachycardia, exertional dyspnea, paroxysmal nocturnal dyspnea, claudication or edema are absent.  Polyuria, polyphagia, polydipsia absent. There is no blurred vision, double vision, or loss of vision.  Also denied are numbness, tingling, or burning of the extremities. No nonhealing skin lesions present. Weight is stable.          Objective:   Physical Exam  Gen.: Healthy and well-nourished in appearance. Alert, appropriate and cooperative throughout exam. Appears younger than stated age  Head: Normocephalic without obvious abnormalities  Eyes: No corneal or conjunctival inflammation noted. Pupils equal round reactive to light and accommodation. Extraocular motion intact. Ears: External  ear exam reveals no significant lesions or deformities. Canals clear .TMs normal. Hearing is grossly normal bilaterally. Nose: External nasal exam reveals no deformity or inflammation. Nasal mucosa are pink and moist. No lesions or exudates noted.   Mouth: Oral mucosa and oropharynx reveal no lesions or exudates.Upper partial. Teeth in good  repair. Neck: No deformities, masses, or tenderness noted. Range of motion & Thyroid normal. Lungs: Normal respiratory effort; chest expands symmetrically. Lungs are clear to auscultation without rales, wheezes, or increased work of breathing. Heart: Normal rate ;slightly irregular rhythm. Normal S1 and S2. No gallop, click, or rub. No murmur. Abdomen: Bowel sounds normal; abdomen soft and nontender. No masses, organomegaly or hernias noted. Genitalia: as per Gyn                                  Musculoskeletal/extremities: No deformity or scoliosis noted of  the thoracic or lumbar spine.  No clubbing, cyanosis, edema, or significant extremity  deformity noted. Range of motion normal .Tone & strength normal. Hand joints normal Fingernail  health good. Able to lie down & sit up w/o help. Negative SLR bilaterally Vascular: Carotid, radial artery, dorsalis pedis and  posterior tibial pulses are full and equal. No bruits present. Neurologic: Alert and oriented x3. Deep tendon reflexes symmetrical and normal.  Gait normal.      Skin: Intact without suspicious lesions or rashes.Scattered small tatooes. Lymph: No cervical, axillary lymphadenopathy present. Psych: Mood and affect are normal. Normally interactive  Assessment & Plan:  #1 comprehensive physical exam; no acute findings  Plan: see Orders  & Recommendations

## 2014-06-05 NOTE — Progress Notes (Signed)
Pre visit review using our clinic review tool, if applicable. No additional management support is needed unless otherwise documented below in the visit note. 

## 2014-06-05 NOTE — Patient Instructions (Signed)
Your next office appointment will be determined based upon review of your pending labs . Those instructions will be transmitted to you through My Chart ulting in delay in care. Please report any significant change in your symptoms. To prevent palpitations or premature beats, avoid stimulants such as decongestants, diet pills, nicotine, or caffeine (coffee, tea, cola, or chocolate) to excess.

## 2014-06-08 ENCOUNTER — Other Ambulatory Visit: Payer: Self-pay | Admitting: Internal Medicine

## 2014-06-08 ENCOUNTER — Encounter: Payer: Self-pay | Admitting: Internal Medicine

## 2014-06-08 DIAGNOSIS — E119 Type 2 diabetes mellitus without complications: Secondary | ICD-10-CM

## 2014-06-08 DIAGNOSIS — R740 Nonspecific elevation of levels of transaminase and lactic acid dehydrogenase [LDH]: Principal | ICD-10-CM

## 2014-06-08 DIAGNOSIS — R7402 Elevation of levels of lactic acid dehydrogenase (LDH): Secondary | ICD-10-CM

## 2014-06-09 ENCOUNTER — Other Ambulatory Visit: Payer: Self-pay | Admitting: Internal Medicine

## 2014-06-09 DIAGNOSIS — E119 Type 2 diabetes mellitus without complications: Secondary | ICD-10-CM

## 2014-06-09 DIAGNOSIS — I493 Ventricular premature depolarization: Secondary | ICD-10-CM | POA: Insufficient documentation

## 2014-06-09 DIAGNOSIS — I10 Essential (primary) hypertension: Secondary | ICD-10-CM

## 2014-06-09 DIAGNOSIS — E785 Hyperlipidemia, unspecified: Secondary | ICD-10-CM

## 2014-07-07 ENCOUNTER — Other Ambulatory Visit: Payer: Self-pay | Admitting: Internal Medicine

## 2014-07-14 ENCOUNTER — Ambulatory Visit (INDEPENDENT_AMBULATORY_CARE_PROVIDER_SITE_OTHER): Payer: 59 | Admitting: Internal Medicine

## 2014-07-14 ENCOUNTER — Encounter: Payer: Self-pay | Admitting: Internal Medicine

## 2014-07-14 VITALS — BP 102/64 | HR 96 | Ht 69.0 in | Wt 222.0 lb

## 2014-07-14 DIAGNOSIS — I493 Ventricular premature depolarization: Secondary | ICD-10-CM

## 2014-07-14 DIAGNOSIS — E785 Hyperlipidemia, unspecified: Secondary | ICD-10-CM

## 2014-07-14 DIAGNOSIS — R002 Palpitations: Secondary | ICD-10-CM

## 2014-07-14 DIAGNOSIS — I10 Essential (primary) hypertension: Secondary | ICD-10-CM

## 2014-07-14 NOTE — Patient Instructions (Signed)
Your physician recommends that you schedule a follow-up appointment in: 6 weeks with Dr. Lovena Le  Your physician has requested that you have an echocardiogram. Echocardiography is a painless test that uses sound waves to create images of your heart. It provides your doctor with information about the size and shape of your heart and how well your heart's chambers and valves are working. This procedure takes approximately one hour. There are no restrictions for this procedure.  Your physician has recommended that you wear a holter monitor. Holter monitors are medical devices that record the heart's electrical activity. Doctors most often use these monitors to diagnose arrhythmias. Arrhythmias are problems with the speed or rhythm of the heartbeat. The monitor is a small, portable device. You can wear one while you do your normal daily activities. This is usually used to diagnose what is causing palpitations/syncope (passing out).

## 2014-07-14 NOTE — Progress Notes (Signed)
HPI [name] is referred today by Dr. Linna Darner for evaluation of symptomatic PVC's. She is a pleasant middle aged woman with a h/o HTN and dyslipidemia. She has minimally symptomatic PVC's. She has never had syncope. She denies chest pain or sob. No edema. No excessive ETOH or caffeine intake. There is no relation to exertion and she denies sustained rapid heart beating.   Allergies  Allergen Reactions  . Neomycin-Bacitracin Zn-Polymyx     REACTION: rash  . Onglyza [Saxagliptin Hydrochloride]     This occurred after starting Kombiglyze . This was present as a rash over the hands and feet dorsally. She has been able to take metformin in the past as well as Janumet without problems.  . Atorvastatin     REACTION: ELEVATES LFT'S. Pt states she is currently taking Atorvastatin 40 mg (1/2 tablet daily) and does not recall hab=ving any issues with it. (07/14/14)  . Codeine     REACTION: nausea  . Sulfamethoxazole-Trimethoprim     REACTION: EXTREME NAUSEA; no rash or fever        Past Medical History  Diagnosis Date  . HTN (hypertension)   . DM (diabetes mellitus)   . Hyperlipidemia   . Colitis     in her 10s; quiescent since    ROS:   All systems reviewed and negative except as noted in the HPI.   Past Surgical History  Procedure Laterality Date  . Knee arthroscopy  1980    Right  . Foot surgery      bone spurs resected, Dr Aida Puffer  . Cervix surgery  2001    Cryosurgery, Dr Connye Burkitt  . Laminectomy  2007    X 2, Dr Gladstone Lighter  . Colonoscopy  2009    Negative, Dr. Delfin Edis     Family History  Problem Relation Age of Onset  . Diabetes Father   . Hypertension Father   . Heart attack Father 90    precipitated by bleeding ulcer  . Diabetes Mother   . Hypertension Mother   . Coronary artery disease Mother   . Diabetes Maternal Grandmother   . Stroke Maternal Grandmother 82  . Heart attack Paternal Grandfather     ? 77  . Heart attack Maternal Grandfather 68    . Cancer Neg Hx      History   Social History  . Marital Status: Divorced    Spouse Name: N/A    Number of Children: N/A  . Years of Education: N/A   Occupational History  . Not on file.   Social History Main Topics  . Smoking status: Former Smoker    Quit date: 07/17/1977  . Smokeless tobacco: Not on file     Comment: Smoked in [place] ; [phone], up to 1/2 ppd  . Alcohol Use: Yes     Comment:  rarely  . Drug Use: No  . Sexual Activity: Not on file   Other Topics Concern  . Not on file   Social History Narrative     BP 102/64 mmHg  Pulse 96  Ht 5' 9"  (1.753 m)  Wt 222 lb (100.699 kg)  BMI 32.77 kg/m2  Physical Exam:  Well appearing middle aged woman, NAD HEENT: Unremarkable Neck:  No JVD, no thyromegally Lymphatics:  No adenopathy Back:  No CVA tenderness Lungs:  Clear with no wheezes HEART:  IRegular rate rhythm, no murmurs, no rubs, no clicks Abd:  soft, positive bowel sounds, no organomegally, no rebound,  no guarding Ext:  2 plus pulses, no edema, no cyanosis, no clubbing Skin:  No rashes no nodules Neuro:  CN II through XII intact, motor grossly intact  EKG - nsr with 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

## 2014-07-14 NOTE — Assessment & Plan Note (Signed)
Her blood pressure is currently well controlled. Will follow.

## 2014-07-14 NOTE — Assessment & Plan Note (Signed)
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 I have recommended she undergo a holter monitor to quantitate the burden of her PVC's and a 2D echo to see if she has evidence of LV dysfunction. I will see her back after these tests. No new meds for now. She will continue her beta blocker.

## 2014-07-14 NOTE — Assessment & Plan Note (Signed)
She is encouraged to maintain a low fat diet, statins and Zetia.

## 2014-07-15 ENCOUNTER — Ambulatory Visit: Payer: 59 | Admitting: Cardiovascular Disease

## 2014-07-16 ENCOUNTER — Ambulatory Visit (HOSPITAL_COMMUNITY): Payer: 59 | Attending: Cardiovascular Disease

## 2014-07-16 ENCOUNTER — Encounter: Payer: Self-pay | Admitting: *Deleted

## 2014-07-16 ENCOUNTER — Encounter (INDEPENDENT_AMBULATORY_CARE_PROVIDER_SITE_OTHER): Payer: 59

## 2014-07-16 DIAGNOSIS — I493 Ventricular premature depolarization: Secondary | ICD-10-CM | POA: Insufficient documentation

## 2014-07-16 DIAGNOSIS — R002 Palpitations: Secondary | ICD-10-CM

## 2014-07-16 NOTE — Progress Notes (Signed)
2D Echo completed. 07/16/2014

## 2014-07-16 NOTE — Progress Notes (Signed)
Patient ID: [name], female   DOB: 1956/01/23, 58 y.o.   MRN: [id number] [facility] 24 hour holter monitor applied to patient.

## 2014-07-18 ENCOUNTER — Other Ambulatory Visit: Payer: Self-pay | Admitting: Internal Medicine

## 2014-07-21 ENCOUNTER — Other Ambulatory Visit: Payer: Self-pay | Admitting: Internal Medicine

## 2014-07-22 ENCOUNTER — Telehealth: Payer: Self-pay | Admitting: Internal Medicine

## 2014-07-22 NOTE — Telephone Encounter (Signed)
Spoke with patient and let her know she has a follow up on 08/13/14 to discuss but we do not have the results back but will let her know tomorrow after he reviews the results

## 2014-07-22 NOTE — Telephone Encounter (Signed)
Follow Up   Pt calling to follow up on lab results. Please call.

## 2014-07-24 NOTE — Telephone Encounter (Signed)
Follow up    Patient calling back to see if the test results are in.

## 2014-07-24 NOTE — Telephone Encounter (Signed)
Talked with patient and she is aware of both the holter and echo results and knows Dr Lovena Le will be here Tues to address

## 2014-07-28 ENCOUNTER — Other Ambulatory Visit: Payer: Self-pay | Admitting: Internal Medicine

## 2014-07-31 ENCOUNTER — Other Ambulatory Visit: Payer: Self-pay | Admitting: Internal Medicine

## 2014-08-05 NOTE — Telephone Encounter (Addendum)
LMOM with patient again.  Unfortunately I was out on Mon and did not get to ask Dr Lovena Le.  He has reviewed echo and signed but did not make any recommendations for change.  She needs to keep her follow up appointment 1/28

## 2014-08-13 ENCOUNTER — Encounter: Payer: Self-pay | Admitting: Internal Medicine

## 2014-08-13 ENCOUNTER — Ambulatory Visit (INDEPENDENT_AMBULATORY_CARE_PROVIDER_SITE_OTHER): Payer: 59 | Admitting: Internal Medicine

## 2014-08-13 VITALS — BP 140/70 | HR 92 | Ht 69.0 in | Wt 224.0 lb

## 2014-08-13 DIAGNOSIS — I493 Ventricular premature depolarization: Secondary | ICD-10-CM

## 2014-08-13 DIAGNOSIS — I10 Essential (primary) hypertension: Secondary | ICD-10-CM

## 2014-08-13 DIAGNOSIS — E785 Hyperlipidemia, unspecified: Secondary | ICD-10-CM

## 2014-08-13 NOTE — Patient Instructions (Signed)
Your physician wants you to follow-up in: 6 months  You will receive a reminder letter in the mail two months in advance. If you don't receive a letter, please call our office to schedule the follow-up appointment.  Your physician recommends that you continue on your current medications as directed. Please refer to the Current Medication list given to you today.  

## 2014-08-14 ENCOUNTER — Telehealth: Payer: Self-pay | Admitting: Internal Medicine

## 2014-08-14 NOTE — Assessment & Plan Note (Signed)
Her blood pressure is reasonably well controlled. I have asked her to try and lose weight. No change in meds.

## 2014-08-14 NOTE — Assessment & Plan Note (Signed)
We discussed the likely benign nature of her PVC's based on total number in a 24 hour period. I offered her medical therapy as well as watchful waiting. She would like to hold of on medical therapy to suppress her PVC's stating that she is not bothered so much by them. I asked that she minize caffeine, ETOH, and any stimulants.

## 2014-08-14 NOTE — Telephone Encounter (Signed)
Patient is going to check with insurance company on which alternative medications would be covered. She will need this done on Monday. She is out of Victoza as of today.

## 2014-08-14 NOTE — Assessment & Plan Note (Signed)
A low fat diet, exercised and weight loss were recommended. She will continue atorvastatin.

## 2014-08-14 NOTE — Progress Notes (Signed)
HPI [name] returns today for ongoing evaluation of symptomatic PVC's. She is a pleasant middle aged woman with a h/o HTN and dyslipidemia. She has minimally symptomatic PVC's. She has never had syncope. She denies chest pain or sob. No edema. No excessive ETOH or caffeine intake. There is no relation to exertion and she denies sustained rapid heart beating. She has worn a cardiac monitor which demonstrated over 6000 PVC's in a 24 hour period. She had a 2D echo which demonstrated normal LV function. She has otherwise felt well. Allergies  Allergen Reactions  . Neomycin-Bacitracin Zn-Polymyx     REACTION: rash  . Onglyza [Saxagliptin Hydrochloride]     This occurred after starting Kombiglyze . This was present as a rash over the hands and feet dorsally. She has been able to take metformin in the past as well as Janumet without problems.  . Atorvastatin     REACTION: ELEVATES LFT'S. Pt states she is currently taking Atorvastatin 40 mg (1/2 tablet daily) and does not recall hab=ving any issues with it. (07/14/14)  . Codeine     REACTION: nausea  . Sulfamethoxazole-Trimethoprim     REACTION: EXTREME NAUSEA; no rash or fever        Past Medical History  Diagnosis Date  . HTN (hypertension)   . DM (diabetes mellitus)   . Hyperlipidemia   . Colitis     in her 55s; quiescent since    ROS:   All systems reviewed and negative except as noted in the HPI.   Past Surgical History  Procedure Laterality Date  . Knee arthroscopy  1980    Right  . Foot surgery      bone spurs resected, Dr Aida Puffer  . Cervix surgery  2001    Cryosurgery, Dr Connye Burkitt  . Laminectomy  2007    X 2, Dr Gladstone Lighter  . Colonoscopy  2009    Negative, Dr. Delfin Edis     Family History  Problem Relation Age of Onset  . Diabetes Father   . Hypertension Father   . Heart attack Father 57    precipitated by bleeding ulcer  . Diabetes Mother   . Hypertension Mother   . Coronary artery disease Mother    . Diabetes Maternal Grandmother   . Stroke Maternal Grandmother 82  . Heart attack Paternal Grandfather     ? 71  . Heart attack Maternal Grandfather 68  . Cancer Neg Hx      History   Social History  . Marital Status: Divorced    Spouse Name: N/A    Number of Children: N/A  . Years of Education: N/A   Occupational History  . Not on file.   Social History Main Topics  . Smoking status: Former Smoker    Quit date: 07/17/1977  . Smokeless tobacco: Not on file     Comment: Smoked in [place] ; [phone], up to 1/2 ppd  . Alcohol Use: Yes     Comment:  rarely  . Drug Use: No  . Sexual Activity: Not on file   Other Topics Concern  . Not on file   Social History Narrative     BP 140/70 mmHg  Pulse 92  Ht 5' 9"  (1.753 m)  Wt 224 lb (101.606 kg)  BMI 33.06 kg/m2  Physical Exam:  Well appearing middle aged woman, NAD HEENT: Unremarkable Neck:  No JVD, no thyromegally Lymphatics:  No adenopathy Back:  No CVA tenderness Lungs:  Clear with no wheezes HEART:  IRegular rate rhythm, no murmurs, no rubs, no clicks Abd:  soft, positive bowel sounds, no organomegally, no rebound, no guarding Ext:  2 plus pulses, no edema, no cyanosis, no clubbing Skin:  No rashes no nodules Neuro:  CN II through XII intact, motor grossly intact  Cardiac monitor - nsr with frequent PVC's but no sustained VT  Echo - Normal LV function    Assess/Plan:

## 2014-08-14 NOTE — Telephone Encounter (Signed)
Patient is out of [place].  She tried to fill but could not bc insurance does not cover anymore.  Is requesting a sample if possible and a new script for something in place of it to be sent to [facility] at [facility] [employer]

## 2014-08-17 ENCOUNTER — Other Ambulatory Visit: Payer: Self-pay | Admitting: Internal Medicine

## 2014-08-17 NOTE — Telephone Encounter (Signed)
A1c was only 6.6% on 06/05/14. See what insurance will cover in place of Victoza; it's OK to be off as A1c was great. In fact I recommend leaving it off & rechecking A1c late this month. Still see what other options are. [facility] has restricted sampling as part of a new policy. [place]

## 2014-08-17 NOTE — Telephone Encounter (Signed)
Phone call to patient. She has been advised of Dr Hopper's response. a1c order is already placed.

## 2014-08-25 ENCOUNTER — Other Ambulatory Visit: Payer: Self-pay | Admitting: Internal Medicine

## 2014-09-17 ENCOUNTER — Other Ambulatory Visit: Payer: Self-pay | Admitting: Internal Medicine

## 2014-10-14 ENCOUNTER — Encounter: Payer: Self-pay | Admitting: Internal Medicine

## 2014-10-15 ENCOUNTER — Other Ambulatory Visit (INDEPENDENT_AMBULATORY_CARE_PROVIDER_SITE_OTHER): Payer: 59

## 2014-10-15 DIAGNOSIS — R7401 Elevation of levels of liver transaminase levels: Secondary | ICD-10-CM

## 2014-10-15 DIAGNOSIS — E119 Type 2 diabetes mellitus without complications: Secondary | ICD-10-CM

## 2014-10-15 DIAGNOSIS — R740 Nonspecific elevation of levels of transaminase and lactic acid dehydrogenase [LDH]: Secondary | ICD-10-CM

## 2014-10-15 DIAGNOSIS — R7402 Elevation of levels of lactic acid dehydrogenase (LDH): Secondary | ICD-10-CM

## 2014-10-15 LAB — HEPATIC FUNCTION PANEL
ALK PHOS: 73 U/L (ref 39–117)
ALT: 33 U/L (ref 0–35)
AST: 34 U/L (ref 0–37)
Albumin: 4.2 g/dL (ref 3.5–5.2)
Bilirubin, Direct: 0.1 mg/dL (ref 0.0–0.3)
Total Bilirubin: 0.6 mg/dL (ref 0.2–1.2)
Total Protein: 7.8 g/dL (ref 6.0–8.3)

## 2014-10-15 LAB — HEMOGLOBIN A1C: Hgb A1c MFr Bld: 7.9 % — ABNORMAL HIGH (ref 4.6–6.5)

## 2014-10-16 ENCOUNTER — Other Ambulatory Visit: Payer: Self-pay | Admitting: Internal Medicine

## 2014-10-16 DIAGNOSIS — E1165 Type 2 diabetes mellitus with hyperglycemia: Secondary | ICD-10-CM

## 2014-10-16 DIAGNOSIS — IMO0002 Reserved for concepts with insufficient information to code with codable children: Secondary | ICD-10-CM

## 2014-10-16 MED ORDER — DULAGLUTIDE 0.75 MG/0.5ML ~~LOC~~ SOAJ: 0.5000 mL | SUBCUTANEOUS | Status: DC

## 2014-10-16 NOTE — Progress Notes (Signed)
Script for Trulicity has been faxed to [facility]

## 2014-10-25 DIAGNOSIS — X088XXA Exposure to other specified smoke, fire and flames, initial encounter: Secondary | ICD-10-CM | POA: Insufficient documentation

## 2014-10-25 DIAGNOSIS — Z79899 Other long term (current) drug therapy: Secondary | ICD-10-CM | POA: Diagnosis not present

## 2014-10-25 DIAGNOSIS — T2121XA Burn of second degree of chest wall, initial encounter: Secondary | ICD-10-CM | POA: Insufficient documentation

## 2014-10-25 DIAGNOSIS — Z794 Long term (current) use of insulin: Secondary | ICD-10-CM | POA: Insufficient documentation

## 2014-10-25 DIAGNOSIS — T2122XA Burn of second degree of abdominal wall, initial encounter: Secondary | ICD-10-CM | POA: Diagnosis not present

## 2014-10-25 DIAGNOSIS — T2102XA Burn of unspecified degree of abdominal wall, initial encounter: Secondary | ICD-10-CM | POA: Diagnosis present

## 2014-10-25 DIAGNOSIS — Y93G3 Activity, cooking and baking: Secondary | ICD-10-CM | POA: Insufficient documentation

## 2014-10-25 DIAGNOSIS — Y92000 Kitchen of unspecified non-institutional (private) residence as  the place of occurrence of the external cause: Secondary | ICD-10-CM | POA: Diagnosis not present

## 2014-10-25 DIAGNOSIS — I10 Essential (primary) hypertension: Secondary | ICD-10-CM | POA: Insufficient documentation

## 2014-10-25 DIAGNOSIS — E119 Type 2 diabetes mellitus without complications: Secondary | ICD-10-CM | POA: Insufficient documentation

## 2014-10-25 DIAGNOSIS — Z7982 Long term (current) use of aspirin: Secondary | ICD-10-CM | POA: Diagnosis not present

## 2014-10-25 DIAGNOSIS — Y998 Other external cause status: Secondary | ICD-10-CM | POA: Diagnosis not present

## 2014-10-25 DIAGNOSIS — Z87891 Personal history of nicotine dependence: Secondary | ICD-10-CM | POA: Diagnosis not present

## 2014-10-25 DIAGNOSIS — Z792 Long term (current) use of antibiotics: Secondary | ICD-10-CM | POA: Insufficient documentation

## 2014-10-25 DIAGNOSIS — E785 Hyperlipidemia, unspecified: Secondary | ICD-10-CM | POA: Insufficient documentation

## 2014-10-25 DIAGNOSIS — Z8719 Personal history of other diseases of the digestive system: Secondary | ICD-10-CM | POA: Diagnosis not present

## 2014-10-26 ENCOUNTER — Emergency Department (HOSPITAL_COMMUNITY)
Admission: EM | Admit: 2014-10-26 | Discharge: 2014-10-26 | Disposition: A | Discharge status: Home / Self Care | Payer: 59 | Attending: Emergency Medicine | Admitting: Emergency Medicine

## 2014-10-26 ENCOUNTER — Encounter (HOSPITAL_COMMUNITY): Payer: Self-pay | Admitting: *Deleted

## 2014-10-26 DIAGNOSIS — T300 Burn of unspecified body region, unspecified degree: Secondary | ICD-10-CM

## 2014-10-26 MED ORDER — OXYCODONE-ACETAMINOPHEN 5-325 MG PO TABS
2.0000 | ORAL_TABLET | Freq: Once | ORAL | Status: AC
  Administered 2014-10-26: 2 via ORAL
  Filled 2014-10-26: qty 2

## 2014-10-26 MED ORDER — CEPHALEXIN 500 MG PO CAPS: 500.0000 mg | ORAL_CAPSULE | Freq: Four times a day (QID) | ORAL | Status: DC

## 2014-10-26 NOTE — Discharge Instructions (Signed)
Burn Care Your skin is a natural barrier to infection. It is the largest organ of your body. Burns damage this natural protection. To help prevent infection, it is very important to follow your caregiver's instructions in the care of your burn. Burns are classified as:  First degree. There is only redness of the skin (erythema). No scarring is expected.  Second degree. There is blistering of the skin. Scarring may occur with deeper burns.  Third degree. All layers of the skin are injured, and scarring is expected. HOME CARE INSTRUCTIONS   Wash your hands well before changing your bandage.  Change your bandage as often as directed by your caregiver.  Remove the old bandage. If the bandage sticks, you may soak it off with cool, clean water.  Cleanse the burn thoroughly but gently with mild soap and water.  Pat the area dry with a clean, dry cloth.  Apply a thin layer of antibacterial cream to the burn.  Apply a clean bandage as instructed by your caregiver.  Keep the bandage as clean and dry as possible.  Elevate the affected area for the first 24 hours, then as instructed by your caregiver.  Only take over-the-counter or prescription medicines for pain, discomfort, or fever as directed by your caregiver. SEEK IMMEDIATE MEDICAL CARE IF:   You develop excessive pain.  You develop redness, tenderness, swelling, or red streaks near the burn.  The burned area develops yellowish-white fluid (pus) or a bad smell.  You have a fever. MAKE SURE YOU:   Understand these instructions.  Will watch your condition.  Will get help right away if you are not doing well or get worse. Document Released: 07/03/2005 Document Revised: 09/25/2011 Document Reviewed: 11/23/2010 ExitCare Patient Information 2015 [employer]. This information is not intended to replace advice given to you by your health care provider. Make sure you discuss any questions you have with your health care  provider.  
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
## 2014-11-04 ENCOUNTER — Encounter: Payer: Self-pay | Admitting: Internal Medicine

## 2014-11-05 ENCOUNTER — Ambulatory Visit: Payer: 59 | Admitting: Internal Medicine

## 2014-11-30 ENCOUNTER — Encounter: Payer: Self-pay | Admitting: Internal Medicine

## 2014-12-03 ENCOUNTER — Encounter: Payer: Self-pay | Admitting: Internal Medicine

## 2014-12-10 ENCOUNTER — Other Ambulatory Visit: Payer: Self-pay | Admitting: Internal Medicine

## 2014-12-10 ENCOUNTER — Other Ambulatory Visit: Payer: Self-pay

## 2014-12-10 MED ORDER — METFORMIN HCL 1000 MG PO TABS: ORAL_TABLET | ORAL | Status: DC

## 2014-12-10 NOTE — Telephone Encounter (Signed)
Metformin rx sent to pharm

## 2014-12-21 ENCOUNTER — Encounter: Payer: Self-pay | Admitting: Internal Medicine

## 2014-12-24 ENCOUNTER — Telehealth: Payer: Self-pay | Admitting: Emergency Medicine

## 2014-12-24 NOTE — Telephone Encounter (Signed)
Pt given OneTouch Verio Flex blood glucose monitoring system.

## 2015-02-01 ENCOUNTER — Other Ambulatory Visit: Payer: Self-pay | Admitting: Internal Medicine

## 2015-02-01 ENCOUNTER — Encounter: Payer: Self-pay | Admitting: Internal Medicine

## 2015-02-01 DIAGNOSIS — E1165 Type 2 diabetes mellitus with hyperglycemia: Secondary | ICD-10-CM

## 2015-02-01 DIAGNOSIS — IMO0002 Reserved for concepts with insufficient information to code with codable children: Secondary | ICD-10-CM

## 2015-02-03 ENCOUNTER — Other Ambulatory Visit (INDEPENDENT_AMBULATORY_CARE_PROVIDER_SITE_OTHER): Payer: 59

## 2015-02-03 DIAGNOSIS — E1165 Type 2 diabetes mellitus with hyperglycemia: Secondary | ICD-10-CM | POA: Diagnosis not present

## 2015-02-03 DIAGNOSIS — IMO0002 Reserved for concepts with insufficient information to code with codable children: Secondary | ICD-10-CM

## 2015-02-03 LAB — HEMOGLOBIN A1C: Hgb A1c MFr Bld: 7 % — ABNORMAL HIGH (ref 4.6–6.5)

## 2015-02-03 LAB — MICROALBUMIN / CREATININE URINE RATIO
CREATININE, U: 208.6 mg/dL
MICROALB UR: 1.6 mg/dL (ref 0.0–1.9)
Microalb Creat Ratio: 0.8 mg/g (ref 0.0–30.0)

## 2015-02-05 ENCOUNTER — Other Ambulatory Visit (INDEPENDENT_AMBULATORY_CARE_PROVIDER_SITE_OTHER): Payer: 59

## 2015-02-05 ENCOUNTER — Encounter: Payer: Self-pay | Admitting: Internal Medicine

## 2015-02-05 ENCOUNTER — Ambulatory Visit (INDEPENDENT_AMBULATORY_CARE_PROVIDER_SITE_OTHER): Payer: 59 | Admitting: Internal Medicine

## 2015-02-05 VITALS — BP 124/62 | HR 110 | Temp 98.4°F | Resp 16 | Wt 221.0 lb

## 2015-02-05 DIAGNOSIS — E785 Hyperlipidemia, unspecified: Secondary | ICD-10-CM

## 2015-02-05 DIAGNOSIS — I493 Ventricular premature depolarization: Secondary | ICD-10-CM

## 2015-02-05 DIAGNOSIS — E1159 Type 2 diabetes mellitus with other circulatory complications: Secondary | ICD-10-CM | POA: Diagnosis not present

## 2015-02-05 DIAGNOSIS — I10 Essential (primary) hypertension: Secondary | ICD-10-CM | POA: Diagnosis not present

## 2015-02-05 DIAGNOSIS — E1151 Type 2 diabetes mellitus with diabetic peripheral angiopathy without gangrene: Secondary | ICD-10-CM

## 2015-02-05 LAB — BASIC METABOLIC PANEL
BUN: 27 mg/dL — ABNORMAL HIGH (ref 6–23)
CALCIUM: 9.8 mg/dL (ref 8.4–10.5)
CHLORIDE: 102 meq/L (ref 96–112)
CO2: 23 mEq/L (ref 19–32)
Creatinine, Ser: 1.15 mg/dL (ref 0.40–1.20)
GFR: 62.08 mL/min (ref >60.00)
Glucose, Bld: 173 mg/dL — ABNORMAL HIGH (ref 70–99)
Potassium: 4.1 mEq/L (ref 3.5–5.1)
Sodium: 135 mEq/L (ref 135–145)

## 2015-02-05 LAB — MAGNESIUM: MAGNESIUM: 2 mg/dL (ref 1.5–2.5)

## 2015-02-05 NOTE — Progress Notes (Signed)
Pre visit review using our clinic review tool, if applicable. No additional management support is needed unless otherwise documented below in the visit note. 

## 2015-02-05 NOTE — Assessment & Plan Note (Signed)
A1c is at goal. Urine microalbumin is normal

## 2015-02-05 NOTE — Patient Instructions (Signed)
To prevent palpitations or premature beats, avoid stimulants such as decongestants, diet pills, nicotine, or caffeine (coffee, tea, cola, or chocolate) to excess.  A1c assesses average 24 hour  glucose over prior 6-12 weeks.  No Diabetes risk if < 6.1%  "Pre Diabetes" :6.2-6.4 % Good diabetic control: 6.5-7 % Fair diabetic control: 7-8 % Poor diabetic control: greater than 8 % ( except with additional factors such as  advanced age; significant coronary or neurologic disease,etc).  Your present value is 7 % representing an average glucose of 172. An  A1c of 8 % or less  is the safest goal for you.Preferred is <7  % as long as there are no low blood glucose events.  Goals for home glucose monitoring are : fasting  or morning glucose goal of  100-150. Ninety minutes after any meal , goal = < 180, preferably < 160. Report any low blood glucoses immediately.  Your next office appointment will be determined based upon review of your pending labs. Those written interpretation of the lab results and instructions will be transmitted to you by My Chart Critical results will be called.  Followup as needed for any active or acute issue. Please report any significant change in your symptoms. Ophthalmology exam recommended annually

## 2015-02-05 NOTE — Assessment & Plan Note (Signed)
Blood pressure is normal today. She is not checking blood pressure at home

## 2015-02-05 NOTE — Assessment & Plan Note (Signed)
Lipids were essentially goal in November 2015. They would be due in November 2 016

## 2015-02-05 NOTE — Progress Notes (Signed)
   Subjective:    Patient ID: [name], female    DOB: Nov 22, 1955, 59 y.o.   MRN: [id number]  HPI The patient is here to assess status of active health conditions.  PMH, FH, & Social History reviewed & updated.No change in [place] as recorded.  She is checking her fasting blood sugars which range from the 140s to 190s. 2 hours after meal range is from 188-205. She denies any hypoglycemia. Ophthalmologic exam is due. She questioned whether her diabetes may be uncontrolled on her Trulicity,Farxiga, and metformin as she was having some vaginal irritation & glucoses were intermittently elevated as noted..  Actually her A1c is 7% ,at goal. Urine microalbumin was within normal limits.  She's been compliant with her medicines without adverse effects. She is on a heart healthy diet. She works out at [employer] 2 times per week 30 minutes as cardio and 15-30 minutes of other exercises for a total of 45-60 minutes. She denies any associated cardiopulmonary symptoms.  She was evaluated by Cardiologist in 12/15 & 01/16 for PVCs. These were not felt to be clinically of concern based on Holter monitoring (note reviewed). No intervention was pursued but options were discussed.  She also has had intermittent epigastric discomfort which she describes as aching. This typically occurs about lunchtime and peaks over several hours and disappears. It only occurs every 2-3 months. There are no other associated GI symptoms with this.  She does have heat intolerance. She's had some hair loss recently. She questioned whether this might be related to medications. She is on a beta blocker.    Review of Systems  Chest pain, tachycardia, exertional dyspnea, paroxysmal nocturnal dyspnea, claudication or edema are absent. No unexplained weight loss, significant dyspepsia, dysphagia, melena, rectal bleeding, or persistently small caliber stools. Dysuria, pyuria, hematuria, frequency, nocturia or polyuria are denied. Change in  skin, nails denied. No bowel changes of constipation or diarrhea. No intolerance to  cold. She denies numbness or tingling in extremities. She has no nonhealing skin lesions.     Objective:   Physical Exam  General appearance :adequately nourished; in no distress. BMI 32.62  Eyes: No conjunctival inflammation or scleral icterus is present.  Oral exam:  Lips and gums are healthy appearing.There is no oropharyngeal erythema or exudate noted. Dental hygiene is good.  Heart:  Normal rate and regular rhythm. S1 and S2 normal without gallop, murmur, click, rub or other extra sounds    Lungs:Chest clear to auscultation; no wheezes, rhonchi,rales ,or rubs present.No increased work of breathing.   Abdomen: bowel sounds normal, soft and non-tender without masses, organomegaly or hernias noted.  No guarding or rebound. Striae present.  Vascular : all pulses equal ; no bruits present.  Skin:Warm & dry.  Intact without suspicious lesions or rashes ; no tenting    Lymphatic: No lymphadenopathy is noted about the head, neck, axilla   Neuro: Strength, tone & DTRs normal.         Assessment & Plan:  See Current Assessment & Plan in Problem List under specific Diagnosis

## 2015-02-05 NOTE — Assessment & Plan Note (Signed)
Calcium, potassium, magnesium, and TSH will be checked

## 2015-02-08 ENCOUNTER — Other Ambulatory Visit: Payer: Self-pay | Admitting: Internal Medicine

## 2015-02-08 LAB — TSH: TSH: 0.83 u[IU]/mL (ref 0.35–4.50)

## 2015-02-14 ENCOUNTER — Other Ambulatory Visit: Payer: Self-pay | Admitting: Internal Medicine

## 2015-02-15 ENCOUNTER — Other Ambulatory Visit: Payer: Self-pay

## 2015-02-15 DIAGNOSIS — I10 Essential (primary) hypertension: Secondary | ICD-10-CM

## 2015-02-15 MED ORDER — BENAZEPRIL HCL 40 MG PO TABS: ORAL_TABLET | ORAL | Status: DC

## 2015-02-15 MED ORDER — EZETIMIBE 10 MG PO TABS: ORAL_TABLET | ORAL | Status: DC

## 2015-02-15 MED ORDER — METOPROLOL TARTRATE 50 MG PO TABS: ORAL_TABLET | ORAL | Status: DC

## 2015-02-19 ENCOUNTER — Encounter: Payer: Self-pay | Admitting: Internal Medicine

## 2015-02-22 ENCOUNTER — Ambulatory Visit (INDEPENDENT_AMBULATORY_CARE_PROVIDER_SITE_OTHER): Payer: 59 | Admitting: Internal Medicine

## 2015-02-22 ENCOUNTER — Encounter: Payer: Self-pay | Admitting: Internal Medicine

## 2015-02-22 ENCOUNTER — Ambulatory Visit (INDEPENDENT_AMBULATORY_CARE_PROVIDER_SITE_OTHER)
Admission: RE | Admit: 2015-02-22 | Discharge: 2015-02-22 | Disposition: A | Discharge status: Home / Self Care | Payer: 59 | Source: Ambulatory Visit | Attending: Internal Medicine | Admitting: Internal Medicine

## 2015-02-22 VITALS — BP 150/70 | HR 97 | Temp 98.5°F | Resp 16 | Wt 220.0 lb

## 2015-02-22 DIAGNOSIS — M5417 Radiculopathy, lumbosacral region: Secondary | ICD-10-CM | POA: Diagnosis not present

## 2015-02-22 DIAGNOSIS — M5416 Radiculopathy, lumbar region: Secondary | ICD-10-CM

## 2015-02-22 MED ORDER — GABAPENTIN 100 MG PO CAPS: ORAL_CAPSULE | ORAL | Status: DC

## 2015-02-22 NOTE — Progress Notes (Signed)
Pre visit review using our clinic review tool, if applicable. No additional management support is needed unless otherwise documented below in the visit note. 

## 2015-02-22 NOTE — Progress Notes (Signed)
   Subjective:    Patient ID: [name], female    DOB: 1956/06/27, 59 y.o.   MRN: [id number]  HPI  Her symptoms began 02/16/15 upon awakening as pain in the left lumbosacral area with radiation to the left upper calf. The pain was described as sharp, aching, as well as burning at times. She also noted numbness and tingling in the same distribution as well as in the bottom of the left foot. This has affected her gait and she has to walk in a stooped fashion. It's worse after she stands for a period time  She had discectomy surgery twice in 2007. Initially in March of that year there was surgery she believes L3 and 4. In September she believes she had surgery at L5. These were done by Dr Paula Compton.  She has had intermittent pain in this distribution since 2007 but it was much less severe compared to this exacerbation.  Review of Systems Fever, chills, sweats, or unexplained weight loss not present. No significant headaches. Mental status change or memory loss denied. Blurred vision , diplopia or vision loss absent. Vertigo, near syncope or imbalance denied. No loss of control of bladder or bowels. No seizure stigmata.     Objective:   Physical Exam  Pertinent or positive findings include: She appears uncomfortable. She is unable to walk on her toes of the left foot due to pain. Straight leg raising is positive as pain to the left inferior calf with elevation past 75. She has crepitus of the knees, greater on the right. The feet are cool. Posterior tibial pulses are decreased. She has no ischemic changes in the feet. Op scar of the lumbosacral spine is well-healed. There is an elongated vertical tattoo in this area.Tenderness to percussion L LS area..  General appearance :adequately nourished; in no distress.  Eyes: No conjunctival inflammation or scleral icterus is present.  Heart:  Normal rate and regular rhythm. S1 and S2 normal without gallop, murmur, click, rub or other  extra sounds    Lungs:Chest clear to auscultation; no wheezes, rhonchi,rales ,or rubs present.No increased work of breathing.   Abdomen: bowel sounds normal, soft and non-tender without masses, organomegaly or hernias noted.  No guarding or rebound.   Vascular : all pulses equal ; no bruits present.  Skin:Warm & dry.  Intact without suspicious lesions or rashes ; no tenting   Lymphatic: No lymphadenopathy is noted about the head, neck, axilla  Neuro: Strength, tone & DTRs normal.        Assessment & Plan:  #1 lumbosacral radiculopathy. History suggests possible spinal stenosis. Plan: imaging Gabapentin trial Consider MRI if symptoms progressive

## 2015-02-22 NOTE — Patient Instructions (Signed)
  Your next office appointment will be determined based upon review of your pending  xrays  Those written interpretation of the lab results and instructions will be transmitted to you by My Chart    Followup as needed for any active or acute issue. Please report any significant change in your symptoms.   Assess response to the gabapentin one every 8 hours as needed. If it is partially beneficial, it can be increased up to a total of 3 pills every 8 hours as needed. This increase of 1 pill each dose  should take place over 72 hours at least.If 300 mg is effective dose ; there is a 300 mg pill.

## 2015-02-25 ENCOUNTER — Encounter: Payer: Self-pay | Admitting: Family Medicine

## 2015-02-25 ENCOUNTER — Encounter: Payer: Self-pay | Admitting: *Deleted

## 2015-02-25 ENCOUNTER — Ambulatory Visit (INDEPENDENT_AMBULATORY_CARE_PROVIDER_SITE_OTHER): Payer: 59 | Admitting: Family Medicine

## 2015-02-25 ENCOUNTER — Other Ambulatory Visit: Payer: Self-pay | Admitting: Internal Medicine

## 2015-02-25 VITALS — BP 138/74 | HR 98 | Wt 219.0 lb

## 2015-02-25 DIAGNOSIS — M5417 Radiculopathy, lumbosacral region: Secondary | ICD-10-CM

## 2015-02-25 DIAGNOSIS — M5416 Radiculopathy, lumbar region: Secondary | ICD-10-CM

## 2015-02-25 MED ORDER — GABAPENTIN 300 MG PO CAPS: ORAL_CAPSULE | ORAL | Status: DC

## 2015-02-25 MED ORDER — PREDNISONE 50 MG PO TABS: 50.0000 mg | ORAL_TABLET | Freq: Every day | ORAL | Status: DC

## 2015-02-25 NOTE — Patient Instructions (Addendum)
Good to see you Ice 20 minutes 2 times daily. Usually after activity and before bed. Prednisone daily for next 5 days Gabapentin 37m at night 1026min AM and PM See me again in 7-10 days

## 2015-02-25 NOTE — Progress Notes (Signed)
[name] [place],  [id number] Phone: [phone] Subjective:    I'm seeing this patient by the request  of:  Unice Cobble, MD   CC: Back pain with radicular symptoms  [id number] [name] is a 59 y.o. female coming in with complaint of back pain with radiation down the left leg. Patient states that the radiation on the posterior aspect awake and can go to her foot. Patient does have a past medical history having discectomies previously at L4-L5 and L5-S1. Patient states that over the course of the last several months the back pain has increased and now the radicular symptoms as started. Feels very similar to when she had a protruding disc previously. Patient states that it is becoming more difficult to do activities of daily living and is having severe pain at night. Patient has only been taking over-the-counter medications as well as gabapentin titrating up to 200 mg 3 times a day. Patient denies any fevers or chills or any abnormal weight loss. Patient is concerned though because it seems to be worsening pain as well as potentially weakness of the lower extremity. Patient rates the severity of 7 out of 10. Patient is active and has many important events coming up in the next several months and is wondering if there is anything to do.  Most recent x-rays were February 22 2015. These were reviewed by me. Patient does have straightened alignment losing the lordosis of the lumbar spine the patient does have what appears to be severe degenerative disc disease at L3-L4 L4-L5 as well as L5-S1. Patient also has severe facet joint arthritis of L4-L5 and L5-S1.  Past Medical History  Diagnosis Date  . HTN (hypertension)   . DM (diabetes mellitus)   . Hyperlipidemia   . Colitis     in her 11s; quiescent since   Past Surgical History  Procedure Laterality Date  . Knee arthroscopy  1980    Right  . Foot surgery      bone spurs resected,  Dr Aida Puffer  . Cervix surgery  2001    Cryosurgery, Dr Connye Burkitt  . Laminectomy  2007    X 2, Dr Gladstone Lighter  . Colonoscopy  2009    Negative, Dr. Delfin Edis   Social History  Substance Use Topics  . Smoking status: Former Smoker    Quit date: 07/17/1977  . Smokeless tobacco: None     Comment: Smoked in [place] ; [phone], up to 1/2 ppd  . Alcohol Use: Yes     Comment:  rarely   Allergies  Allergen Reactions  . Neomycin-Bacitracin Zn-Polymyx     REACTION: rash  . Onglyza [Saxagliptin Hydrochloride]     This occurred after starting Kombiglyze . This was present as a rash over the hands and feet dorsally. She has been able to take metformin in the past as well as Janumet without problems.  . Atorvastatin     REACTION: ELEVATES LFT'S. Pt states she is currently taking Atorvastatin 40 mg (1/2 tablet daily) and does not recall hab=ving any issues with it. (07/14/14)  . Codeine     REACTION: nausea  . Sulfamethoxazole-Trimethoprim     REACTION: EXTREME NAUSEA; no rash or fever   Family History  Problem Relation Age of Onset  . Diabetes Father   . Hypertension Father   . Heart attack Father 65    precipitated by bleeding ulcer  . Diabetes Mother   . Hypertension Mother   .  Coronary artery disease Mother   . Diabetes Maternal Grandmother   . Stroke Maternal Grandmother 82  . Heart attack Paternal Grandfather     ? 46  . Heart attack Maternal Grandfather 68  . Cancer Neg Hx         Past medical history, social, surgical and family history all reviewed in electronic medical record.   Review of Systems: No headache, visual changes, nausea, vomiting, diarrhea, constipation, dizziness, abdominal pain, skin rash, fevers, chills, night sweats, weight loss, swollen lymph nodes, body aches, joint swelling, muscle aches, chest pain, shortness of breath, mood changes.   Objective Blood pressure 138/74, pulse 98, weight 219 lb (99.338 kg), SpO2 97 %.  General: No apparent distress  alert and oriented x3 mood and affect normal, dressed appropriately.  HEENT: Pupils equal, extraocular movements intact  Respiratory: Patient's speak in full sentences and does not appear short of breath  Cardiovascular: No lower extremity edema, non tender, no erythema  Skin: Warm dry intact with no signs of infection or rash on extremities or on axial skeleton.  Abdomen: Soft nontender  Neuro: Cranial nerves II through XII are intact, neurovascularly intact in all extremities with 2+ DTRs and 2+ pulses.  Lymph: No lymphadenopathy of posterior or anterior cervical chain or axillae bilaterally.  Gait antalgic gait with weakness of left leg MSK:  Non tender with full range of motion and good stability and symmetric strength and tone of shoulders, elbows, wrist, hip, knee and ankles bilaterally.  Back Exam:  Inspection: Unremarkable  Motion: Flexion 35 deg, Extension 15 deg, Side Bending to 35 deg bilaterally,  Rotation to 35 deg bilaterally  SLR laying: Positive left side on patient's leg she also has atrophy of the musculature on the left side XSLR laying: Negative  Palpable tenderness: Dull tenderness to palpation over the posterior gastrocnemius on left side and hamstrings. FABER: negative. Sensory change: Gross sensation intact to all lumbar and sacral dermatomes.  Reflexes: 2+ at both patellar tendons, 2+ at achilles tendons, Babinski's downgoing.  Strength at foot  Plantar-flexion: 4/5 on left compared to the contralateral side Dorsi-flexion: 5/5 Eversion: 5/5 Inversion: 5/5  Leg strength  Quad: 5/5 Hamstring: 4/5 on left compared to full strength on right Hip flexor: 5/5 Hip abductors: 3+/5 and symmetric     Impression and Recommendations:     This case required medical decision making of moderate complexity.

## 2015-02-25 NOTE — Assessment & Plan Note (Signed)
Patient is having worsening symptoms. I'm concerned the patient's low back pain with radiculopathy is likely secondary to some of her surgical changes she's had quite a while ago. Patient does have what appears to be an S1 radicular symptoms. X-rays have been reviewed and this will be consistent with degenerative changes that we are seen. Patient will try conservative therapy with a high-dose prednisone as well as gabapentin. Patient is well learned on the signs and symptoms of worsening neurologic compromise. Patient increased her gabapentin. Patient is moving her daughter to college this week and will follow-up next week to make sure improving. Otherwise advance imaging would be warranted. Patient does have any nerve root impingement we will consider epidural steroid-dependent injection or facet injections versus the possibility of surgical intervention in referral.

## 2015-02-25 NOTE — Progress Notes (Signed)
Pre visit review using our clinic review tool, if applicable. No additional management support is needed unless otherwise documented below in the visit note. 

## 2015-02-26 ENCOUNTER — Encounter: Payer: Self-pay | Admitting: Family Medicine

## 2015-03-03 ENCOUNTER — Encounter: Payer: Self-pay | Admitting: Family Medicine

## 2015-03-10 ENCOUNTER — Ambulatory Visit (INDEPENDENT_AMBULATORY_CARE_PROVIDER_SITE_OTHER): Payer: 59 | Admitting: Family Medicine

## 2015-03-10 ENCOUNTER — Encounter: Payer: Self-pay | Admitting: Family Medicine

## 2015-03-10 VITALS — BP 126/72 | HR 95 | Ht 69.0 in | Wt 219.0 lb

## 2015-03-10 DIAGNOSIS — M5417 Radiculopathy, lumbosacral region: Secondary | ICD-10-CM | POA: Diagnosis not present

## 2015-03-10 DIAGNOSIS — M5416 Radiculopathy, lumbar region: Secondary | ICD-10-CM

## 2015-03-10 MED ORDER — TRAMADOL HCL 50 MG PO TABS: 50.0000 mg | ORAL_TABLET | Freq: Two times a day (BID) | ORAL | Status: DC | PRN

## 2015-03-10 MED ORDER — GABAPENTIN 300 MG PO CAPS: 300.0000 mg | ORAL_CAPSULE | Freq: Three times a day (TID) | ORAL | Status: DC

## 2015-03-10 NOTE — Patient Instructions (Addendum)
Good to see you Tramadol up to 2 times a day  MRI ordered Increase gabapentin to 349m 3 times a day I will call you and discuss the findings.

## 2015-03-10 NOTE — Progress Notes (Signed)
Pre visit review using our clinic review tool, if applicable. No additional management support is needed unless otherwise documented below in the visit note. 

## 2015-03-10 NOTE — Assessment & Plan Note (Signed)
Patient is having lumbar radiculopathy that is concerning with patient's weakness as well as positive straight leg test. I'm concern for an S1 nerve root impingement with patient's x-rays and previous discectomies noted almost 10 years ago. I do feel that patient needs advance imaging at this time because it is affecting strength, stability, and her daily activities. Patient will increase her gabapentin to 300 mg 3 times a day as well as given tramadol for breakthrough pain. Depending on findings she may be a candidate for a epidural bursa nerve root injection. If significant stenosis noted surgical intervention may be necessary.  Spent  25 minutes with patient face-to-face and had greater than 50% of counseling including as described above in assessment and plan.

## 2015-03-10 NOTE — Progress Notes (Signed)
[facility] [place], [place] [address] Phone: [phone] Subjective:    I'm seeing this patient by the request  of:  Unice Cobble, MD   CC: Back pain with radicular symptoms  HER:DEYCXKGYJE ISSABELA LESKO is a 59 y.o. female coming in with complaint of back pain with radiation down the left leg. Patient states that the radiation on the posterior aspect awake and can go to her foot. Patient does have a past medical history having discectomies previously at L4-L5 and L5-S1.  Patient was seen 10 days ago and was found to have more radicular symptoms that seemed to correspond with an S1 nerve root impingement. Patient was given gabapentin, home exercises, prednisone, and icing protocol. Patient states the prednisone did seem to help for short finite amount of time and then the pain unfortunate is coming back. Still having the radicular symptoms on the posterior aspect of the leg going all way to the heel. States that it is affecting her job, her sleep, as well as her walking. Noticing some mild weakness on this side.  Most recent imaging: Most recent x-rays were February 22 2015.  does have what appears to be severe degenerative disc disease at L3-L4 L4-L5 as well as L5-S1. Patient also has severe facet joint arthritis of L4-L5 and L5-S1.  Past Medical History  Diagnosis Date  . HTN (hypertension)   . DM (diabetes mellitus)   . Hyperlipidemia   . Colitis     in her 17s; quiescent since   Past Surgical History  Procedure Laterality Date  . Knee arthroscopy  1980    Right  . Foot surgery      bone spurs resected, Dr Aida Puffer  . Cervix surgery  2001    Cryosurgery, Dr Connye Burkitt  . Laminectomy  2007    X 2, Dr Gladstone Lighter  . Colonoscopy  2009    Negative, Dr. Delfin Edis   Social History  Substance Use Topics  . Smoking status: Former Smoker    Quit date: 07/17/1977  . Smokeless tobacco: None     Comment: Smoked in [place] ; [phone], up  to 1/2 ppd  . Alcohol Use: Yes     Comment:  rarely   Allergies  Allergen Reactions  . Neomycin-Bacitracin Zn-Polymyx     REACTION: rash  . Onglyza [Saxagliptin Hydrochloride]     This occurred after starting Kombiglyze . This was present as a rash over the hands and feet dorsally. She has been able to take metformin in the past as well as Janumet without problems.  . Atorvastatin     REACTION: ELEVATES LFT'S. Pt states she is currently taking Atorvastatin 40 mg (1/2 tablet daily) and does not recall hab=ving any issues with it. (07/14/14)  . Codeine     REACTION: nausea  . Sulfamethoxazole-Trimethoprim     REACTION: EXTREME NAUSEA; no rash or fever   Family History  Problem Relation Age of Onset  . Diabetes Father   . Hypertension Father   . Heart attack Father 62    precipitated by bleeding ulcer  . Diabetes Mother   . Hypertension Mother   . Coronary artery disease Mother   . Diabetes Maternal Grandmother   . Stroke Maternal Grandmother 82  . Heart attack Paternal Grandfather     ? 89  . Heart attack Maternal Grandfather 68  . Cancer Neg Hx         Past medical history, social, surgical and  family history all reviewed in electronic medical record.   Review of Systems: No headache, visual changes, nausea, vomiting, diarrhea, constipation, dizziness, abdominal pain, skin rash, fevers, chills, night sweats, weight loss, swollen lymph nodes, body aches, joint swelling, muscle aches, chest pain, shortness of breath, mood changes.   Objective Blood pressure 126/72, pulse 95, height 5' 9"  (1.753 m), weight 219 lb (99.338 kg), SpO2 97 %.  General: No apparent distress alert and oriented x3 mood and affect normal, dressed appropriately.  HEENT: Pupils equal, extraocular movements intact  Respiratory: Patient's speak in full sentences and does not appear short of breath  Cardiovascular: No lower extremity edema, non tender, no erythema  Skin: Warm dry intact with no signs of  infection or rash on extremities or on axial skeleton.  Abdomen: Soft nontender  Neuro: Cranial nerves II through XII are intact, neurovascularly intact in all extremities with 2+ DTRs and 2+ pulses.  Lymph: No lymphadenopathy of posterior or anterior cervical chain or axillae bilaterally.  Gait antalgic gait with weakness of left leg MSK:  Non tender with full range of motion and good stability and symmetric strength and tone of shoulders, elbows, wrist, hip, knee and ankles bilaterally.  Back Exam:  Inspection: Unremarkable  Motion: Flexion 35 deg, Extension 15 deg, Side Bending to 35 deg bilaterally,  Rotation to 35 deg bilaterally  SLR laying: Positive left side on patient's leg she also has atrophy of the musculature on the left side 17 cm in diameter compared to 18 cm on the contralateral thigh XSLR laying: Negative  Palpable tenderness: Dull tenderness to palpation over the posterior gastrocnemius on left side and hamstrings. No specific area of tenderness FABER: negative. Sensory change: Gross sensation intact to all lumbar and sacral dermatomes.  Reflexes: 2+ at both patellar tendons, 2+ at achilles tendons, still present and symmetric Babinski's downgoing.  Strength at foot  Plantar-flexion: 4/5 on left compared to the contralateral side Dorsi-flexion: 5/5 Eversion: 5/5 Inversion: 5/5  Leg strength  Quad: 5/5 Hamstring: 4/5 on left compared to full strength on right Hip flexor: 5/5 Hip abductors: 3+/5 and symmetric     Impression and Recommendations:     This case required medical decision making of moderate complexity.

## 2015-03-12 ENCOUNTER — Encounter: Payer: Self-pay | Admitting: Family Medicine

## 2015-03-12 DIAGNOSIS — M5416 Radiculopathy, lumbar region: Secondary | ICD-10-CM

## 2015-03-23 ENCOUNTER — Ambulatory Visit
Admission: RE | Admit: 2015-03-23 | Discharge: 2015-03-23 | Disposition: A | Discharge status: Home / Self Care | Payer: 59 | Source: Ambulatory Visit | Attending: Family Medicine | Admitting: Family Medicine

## 2015-03-23 DIAGNOSIS — M5416 Radiculopathy, lumbar region: Secondary | ICD-10-CM

## 2015-03-24 ENCOUNTER — Encounter: Payer: Self-pay | Admitting: Family Medicine

## 2015-03-24 DIAGNOSIS — M5416 Radiculopathy, lumbar region: Secondary | ICD-10-CM

## 2015-03-30 ENCOUNTER — Other Ambulatory Visit: Payer: Self-pay | Admitting: Internal Medicine

## 2015-03-30 NOTE — Telephone Encounter (Signed)
OK X 3 mos

## 2015-03-30 NOTE — Telephone Encounter (Signed)
Please verify instructions.

## 2015-03-31 NOTE — Telephone Encounter (Signed)
Refills has been sent...[name]

## 2015-04-01 ENCOUNTER — Ambulatory Visit
Admission: RE | Admit: 2015-04-01 | Discharge: 2015-04-01 | Disposition: A | Discharge status: Home / Self Care | Payer: 59 | Source: Ambulatory Visit | Attending: Family Medicine | Admitting: Family Medicine

## 2015-04-01 ENCOUNTER — Other Ambulatory Visit: Payer: Self-pay | Admitting: Family Medicine

## 2015-04-01 VITALS — BP 179/90 | HR 102

## 2015-04-01 DIAGNOSIS — M5416 Radiculopathy, lumbar region: Secondary | ICD-10-CM

## 2015-04-01 MED ORDER — IOHEXOL 180 MG/ML  SOLN: 1.0000 mL | Freq: Once | INTRAMUSCULAR | Status: DC | PRN

## 2015-04-01 MED ORDER — METHYLPREDNISOLONE ACETATE 40 MG/ML INJ SUSP (RADIOLOG: 120.0000 mg | Freq: Once | INTRAMUSCULAR | Status: DC

## 2015-04-01 NOTE — Progress Notes (Signed)
Pt's left leg is numb and weak and she will remain until she can stand.

## 2015-04-01 NOTE — Discharge Instructions (Signed)

## 2015-04-26 ENCOUNTER — Other Ambulatory Visit: Payer: Self-pay | Admitting: Internal Medicine

## 2015-04-27 ENCOUNTER — Other Ambulatory Visit: Payer: Self-pay | Admitting: Emergency Medicine

## 2015-04-27 MED ORDER — METFORMIN HCL 1000 MG PO TABS: ORAL_TABLET | ORAL | Status: DC

## 2015-04-27 MED ORDER — DAPAGLIFLOZIN PROPANEDIOL 5 MG PO TABS: 5.0000 mg | ORAL_TABLET | Freq: Every day | ORAL | Status: DC

## 2015-04-29 ENCOUNTER — Encounter: Payer: Self-pay | Admitting: Internal Medicine

## 2015-05-24 LAB — HM DIABETES EYE EXAM

## 2015-05-31 LAB — HM DIABETES EYE EXAM

## 2015-06-07 ENCOUNTER — Other Ambulatory Visit (INDEPENDENT_AMBULATORY_CARE_PROVIDER_SITE_OTHER): Payer: 59

## 2015-06-07 ENCOUNTER — Ambulatory Visit (INDEPENDENT_AMBULATORY_CARE_PROVIDER_SITE_OTHER): Payer: 59 | Admitting: Internal Medicine

## 2015-06-07 ENCOUNTER — Encounter: Payer: Self-pay | Admitting: Internal Medicine

## 2015-06-07 VITALS — BP 114/70 | HR 85 | Temp 97.9°F | Wt 218.0 lb

## 2015-06-07 DIAGNOSIS — Z0189 Encounter for other specified special examinations: Secondary | ICD-10-CM

## 2015-06-07 DIAGNOSIS — Z0000 Encounter for general adult medical examination without abnormal findings: Secondary | ICD-10-CM | POA: Diagnosis not present

## 2015-06-07 LAB — LIPID PANEL
Cholesterol: 172 mg/dL (ref 0–200)
HDL: 53.1 mg/dL (ref >39.00)
LDL CALC: 105 mg/dL — AB (ref 0–99)
NONHDL: 119.33
Total CHOL/HDL Ratio: 3
Triglycerides: 71 mg/dL (ref 0.0–149.0)
VLDL: 14.2 mg/dL (ref 0.0–40.0)

## 2015-06-07 LAB — BASIC METABOLIC PANEL
BUN: 21 mg/dL (ref 6–23)
CALCIUM: 10.1 mg/dL (ref 8.4–10.5)
CO2: 25 meq/L (ref 19–32)
Chloride: 103 mEq/L (ref 96–112)
Creatinine, Ser: 1.06 mg/dL (ref 0.40–1.20)
GFR: 68.13 mL/min (ref >60.00)
Glucose, Bld: 131 mg/dL — ABNORMAL HIGH (ref 70–99)
POTASSIUM: 3.5 meq/L (ref 3.5–5.1)
SODIUM: 141 meq/L (ref 135–145)

## 2015-06-07 LAB — MICROALBUMIN / CREATININE URINE RATIO
Creatinine,U: 202.7 mg/dL
Microalb Creat Ratio: 0.7 mg/g (ref 0.0–30.0)
Microalb, Ur: 1.4 mg/dL (ref 0.0–1.9)

## 2015-06-07 LAB — HEPATIC FUNCTION PANEL
ALK PHOS: 80 U/L (ref 39–117)
ALT: 54 U/L — ABNORMAL HIGH (ref 0–35)
AST: 63 U/L — AB (ref 0–37)
Albumin: 4.7 g/dL (ref 3.5–5.2)
BILIRUBIN TOTAL: 1 mg/dL (ref 0.2–1.2)
Bilirubin, Direct: 0.2 mg/dL (ref 0.0–0.3)
Total Protein: 8.2 g/dL (ref 6.0–8.3)

## 2015-06-07 LAB — TSH: TSH: 0.84 u[IU]/mL (ref 0.35–4.50)

## 2015-06-07 LAB — HEMOGLOBIN A1C: Hgb A1c MFr Bld: 7.5 % — ABNORMAL HIGH (ref 4.6–6.5)

## 2015-06-07 NOTE — Patient Instructions (Signed)
  Your next office appointment will be determined based upon review of your pending labs. Those written interpretation of the lab results and instructions will be transmitted to you by My Chart  Critical results will be called.   Followup as needed for any active or acute issue. Please report any significant change in your symptoms.

## 2015-06-07 NOTE — Progress Notes (Signed)
Pre visit review using our clinic review tool, if applicable. No additional management support is needed unless otherwise documented below in the visit note. 

## 2015-06-07 NOTE — Progress Notes (Signed)
   Subjective:    Patient ID: [name], female    DOB: [birth year], 59 y.o.   MRN: [id number]  HPI The patient is here for a physical to assess status of active health conditions.  PMH, FH, & Social History reviewed & updated.No change in [place] as recorded.  She's been compliant with her medications without adverse effects. She does miss doses occasionally however. She avoids red meat or fried foods. She does eat some frozen foods and salt. She's not been exercising since August because of back pain. She's receiving injections from Dr. Tamala Julian. [facility] without associated cardiopulmonary symptoms  Colonoscopy is up-to-date. She has no GI symptoms except for occasional dyspepsia.  She does have "extra beat" and is being followed by cardiology. This does not increase with activity.  Fasting glucoses are in the 140s. 2 hours after meals they're in the 200s. Ophthalmologic exam is up-to-date. She had no retinopathy.   Review of Systems  Chest pain, tachycardia, exertional dyspnea, paroxysmal nocturnal dyspnea, claudication or edema are absent. No unexplained weight loss, abdominal pain, significant dyspepsia, dysphagia, melena, rectal bleeding, or persistently small caliber stools. Dysuria, pyuria, hematuria, frequency, nocturia or polyuria are denied. Change in skin or  nails denied. Her hair is "thin". No bowel changes of constipation or diarrhea. No intolerance to heat,not to cold.     Objective:   Physical Exam  Pertinent or positive findings include: Upper partial.She has scattered tattoos. She has frequent premature beats.  General appearance :adequately nourished; in no distress. BMI is 32.18.  Eyes: No conjunctival inflammation or scleral icterus is present.  Oral exam:  Lips and gums are healthy appearing.There is no oropharyngeal erythema or exudate noted. Dental hygiene is good.  Heart:   S1 and S2 normal without  gallop, murmur, click, or rub     Lungs:Chest clear to auscultation; no wheezes, rhonchi,rales ,or rubs present.No increased work of breathing.   Abdomen: bowel sounds normal, soft and non-tender without masses, organomegaly or hernias noted.  No guarding or rebound. .  Vascular : all pulses equal ; no bruits present.  Skin:Warm & dry.  Intact without suspicious lesions or rashes ; no tenting or jaundice   Lymphatic: No lymphadenopathy is noted about the head, neck, axilla.   Neuro: Strength, tone & DTRs normal.      Assessment & Plan:  #1 comprehensive physical exam; no acute findings  Plan: see Orders  & Recommendations

## 2015-06-08 LAB — CBC WITH DIFFERENTIAL/PLATELET
BASOS ABS: 0 10*3/uL (ref 0.0–0.1)
Basophils Relative: 0.4 % (ref 0.0–3.0)
Eosinophils Absolute: 0.4 10*3/uL (ref 0.0–0.7)
Eosinophils Relative: 4.8 % (ref 0.0–5.0)
HCT: 45.7 % (ref 36.0–46.0)
Hemoglobin: 14.9 g/dL (ref 12.0–15.0)
LYMPHS PCT: 29.3 % (ref 12.0–46.0)
Lymphs Abs: 2.4 10*3/uL (ref 0.7–4.0)
MCHC: 32.6 g/dL (ref 30.0–36.0)
MCV: 93.3 fl (ref 78.0–100.0)
MONOS PCT: 6.6 % (ref 3.0–12.0)
Monocytes Absolute: 0.6 10*3/uL (ref 0.1–1.0)
NEUTROS PCT: 58.9 % (ref 43.0–77.0)
Neutro Abs: 4.9 10*3/uL (ref 1.4–7.7)
Platelets: 119 10*3/uL — ABNORMAL LOW (ref 150.0–400.0)
RBC: 4.89 Mil/uL (ref 3.87–5.11)
RDW: 13.3 % (ref 11.5–15.5)
WBC: 8.3 10*3/uL (ref 4.0–10.5)

## 2015-06-09 ENCOUNTER — Other Ambulatory Visit: Payer: Self-pay | Admitting: Internal Medicine

## 2015-06-09 DIAGNOSIS — E1151 Type 2 diabetes mellitus with diabetic peripheral angiopathy without gangrene: Secondary | ICD-10-CM

## 2015-06-23 ENCOUNTER — Encounter: Payer: Self-pay | Admitting: Internal Medicine

## 2015-06-25 ENCOUNTER — Other Ambulatory Visit: Payer: Self-pay | Admitting: Internal Medicine

## 2015-06-29 ENCOUNTER — Other Ambulatory Visit: Payer: Self-pay | Admitting: Internal Medicine

## 2015-07-01 ENCOUNTER — Telehealth: Payer: Self-pay

## 2015-07-01 NOTE — Telephone Encounter (Signed)
Left Voice Mail for pt to call back.   RE: Flu Vaccine for 2016

## 2015-07-05 ENCOUNTER — Ambulatory Visit: Payer: 59 | Admitting: Internal Medicine

## 2015-07-05 ENCOUNTER — Encounter: Payer: Self-pay | Admitting: Internal Medicine

## 2015-07-05 ENCOUNTER — Ambulatory Visit (INDEPENDENT_AMBULATORY_CARE_PROVIDER_SITE_OTHER): Payer: 59 | Admitting: Internal Medicine

## 2015-07-05 VITALS — BP 102/64 | HR 58 | Temp 98.0°F | Resp 20 | Wt 218.8 lb

## 2015-07-05 DIAGNOSIS — B373 Candidiasis of vulva and vagina: Secondary | ICD-10-CM

## 2015-07-05 DIAGNOSIS — E1165 Type 2 diabetes mellitus with hyperglycemia: Secondary | ICD-10-CM

## 2015-07-05 DIAGNOSIS — B3731 Acute candidiasis of vulva and vagina: Secondary | ICD-10-CM

## 2015-07-05 MED ORDER — DAPAGLIFLOZIN PROPANEDIOL 10 MG PO TABS: 10.0000 mg | ORAL_TABLET | Freq: Every day | ORAL | Status: DC

## 2015-07-05 MED ORDER — FLUCONAZOLE 150 MG PO TABS: 150.0000 mg | ORAL_TABLET | Freq: Once | ORAL | Status: DC

## 2015-07-05 MED ORDER — DULAGLUTIDE 1.5 MG/0.5ML ~~LOC~~ SOAJ: SUBCUTANEOUS | Status: DC

## 2015-07-05 NOTE — Patient Instructions (Signed)
Please continue: - Metformin 1000 mg 2x a day with lunch and dinner  Increase: - Trulicity to 1.5 mg weekly - Farxiga to 10 mg daily in am  Please return in 1.5 months with your sugar log.   Please let me know if the sugars are consistently <80 or >200.  PATIENT INSTRUCTIONS FOR TYPE 2 DIABETES:  **Please join MyChart!** - see attached instructions about how to join if you have not done so already.  DIET AND EXERCISE Diet and exercise is an important part of diabetic treatment.  We recommended aerobic exercise in the form of brisk walking (working between 40-60% of maximal aerobic capacity, similar to brisk walking) for 150 minutes per week (such as 30 minutes five days per week) along with 3 times per week performing 'resistance' training (using various gauge rubber tubes with handles) 5-10 exercises involving the major muscle groups (upper body, lower body and core) performing 10-15 repetitions (or near fatigue) each exercise. Start at half the above goal but build slowly to reach the above goals. If limited by weight, joint pain, or disability, we recommend daily walking in a swimming pool with water up to waist to reduce pressure from joints while allow for adequate exercise.    BLOOD GLUCOSES Monitoring your blood glucoses is important for continued management of your diabetes. Please check your blood glucoses 2-4 times a day: fasting, before meals and at bedtime (you can rotate these measurements - e.g. one day check before the 3 meals, the next day check before 2 of the meals and before bedtime, etc.).   HYPOGLYCEMIA (low blood sugar) Hypoglycemia is usually a reaction to not eating, exercising, or taking too much insulin/ other diabetes drugs.  Symptoms include tremors, sweating, hunger, confusion, headache, etc. Treat IMMEDIATELY with 15 grams of Carbs: . 4 glucose tablets .  cup regular juice/soda . 2 tablespoons raisins . 4 teaspoons sugar . 1 tablespoon honey Recheck blood  glucose in 15 mins and repeat above if still symptomatic/blood glucose <100.  RECOMMENDATIONS TO REDUCE YOUR RISK OF DIABETIC COMPLICATIONS: * Take your prescribed MEDICATION(S) * Follow a DIABETIC diet: Complex carbs, fiber rich foods, (monounsaturated and polyunsaturated) fats * AVOID saturated/trans fats, high fat foods, >2,300 mg salt per day. * EXERCISE at least 5 times a week for 30 minutes or preferably daily.  * DO NOT SMOKE OR DRINK more than 1 drink a day. * Check your FEET every day. Do not wear tightfitting shoes. Contact us if you develop an ulcer * See your EYE doctor once a year or more if needed * Get a FLU shot once a year * Get a PNEUMONIA vaccine once before and once after age 2 years  GOALS:  * Your Hemoglobin A1c of <7%  * fasting sugars need to be <130 * after meals sugars need to be <180 (2h after you start eating) * Your Systolic BP should be 629 or lower  * Your Diastolic BP should be 80 or lower  * Your HDL (Good Cholesterol) should be 40 or higher  * Your LDL (Bad Cholesterol) should be 100 or lower. * Your Triglycerides should be 150 or lower  * Your Urine microalbumin (kidney function) should be <30 * Your Body Mass Index should be 25 or lower    Please consider the following ways to cut down carbs and fat and increase fiber and micronutrients in your diet: - substitute whole grain for white bread or pasta - substitute brown rice for white rice -  substitute 90-calorie flat bread pieces for slices of bread when possible - substitute sweet potatoes or yams for white potatoes - substitute humus for margarine - substitute tofu for cheese when possible - substitute almond or rice milk for regular milk (would not drink soy milk daily due to concern for soy estrogen influence on breast cancer risk) - substitute dark chocolate for other sweets when possible - substitute water - can add lemon or orange slices for taste - for diet sodas (artificial sweeteners  will trick your body that you can eat sweets without getting calories and will lead you to overeating and weight gain in the long run) - do not skip breakfast or other meals (this will slow down the metabolism and will result in more weight gain over time)  - can try smoothies made from fruit and almond/rice milk in am instead of regular breakfast - can also try old-fashioned (not instant) oatmeal made with almond/rice milk in am - order the dressing on the side when eating salad at a restaurant (pour less than half of the dressing on the salad) - eat as little meat as possible - can try juicing, but should not forget that juicing will get rid of the fiber, so would alternate with eating raw veg./fruits or drinking smoothies - use as little oil as possible, even when using olive oil - can dress a salad with a mix of balsamic vinegar and lemon juice, for e.g. - use agave nectar, stevia sugar, or regular sugar rather than artificial sweateners - steam or broil/roast veggies  - snack on veggies/fruit/nuts (unsalted, preferably) when possible, rather than processed foods - reduce or eliminate aspartame in diet (it is in diet sodas, chewing gum, etc) Read the labels!  Try to read Dr. Janene Harvey book: "Program for Reversing Diabetes" for other ideas for healthy eating.

## 2015-07-05 NOTE — Progress Notes (Signed)
Patient ID: [name], female   DOB: Dec 29, 1955, 59 y.o.   MRN: [id number]  HPI: [name] is a 60 y.o.-year-old female, referred by her PCP, Dr.Hopper, for management of DM2, dx in 2000, non-insulin-dependent, uncontrolled, without complications.  Last hemoglobin A1c was: Lab Results  Component Value Date   HGBA1C 7.5* 06/07/2015   HGBA1C 7.0* 02/03/2015   HGBA1C 7.9* 10/15/2014   Pt is on a regimen of: - Metformin 1000 mg daily, with lunch and dinner (largest meals of the day) - Trulicity 0.25 mg weekly - Farxiga 5 mg daily - may forget. She has a yeast inf.  She was on Victoza up to 1 year ago when insurance did not cover it. She was on Kombiglyze = Metformin + Onglyza (Saxagliptin) - 1000-2.5 mg daily >> rash  Pt checks her sugars 1-2x a day and they are: - am: 140-160s - 2h after b'fast: n/c - before lunch: n/c - 2h after lunch: 160-180s, 210 - before dinner: n/c - 2h after dinner: n/c - bedtime: n/c - nighttime: n/c No lows. Lowest sugar was 140; she has hypoglycemia awareness at 80  Highest sugar was 210.  Glucometer: One Touch Ultra  Pt's meals are: - Breakfast: yoghurt, hard boiled egg - Lunch: soup, sandwich - Dinner: varies - no red meat - Snacks: 2- cheese and crackers, yoghurt Exercise: cardio 3x a week.  - no CKD, last BUN/creatinine:  Lab Results  Component Value Date   BUN 21 06/07/2015   CREATININE 1.06 06/07/2015   Component     Latest Ref Rng 06/07/2015  Microalb, Ur     0.0 - 1.9 mg/dL 1.4  Creatinine,U      202.7  MICROALB/CREAT RATIO     0.0 - 30.0 mg/g 0.7  On benazepril. - last set of lipids: Lab Results  Component Value Date   CHOL 172 06/07/2015   HDL 53.10 06/07/2015   LDLCALC 105* 06/07/2015   LDLDIRECT 149.9 02/14/2010   TRIG 71.0 06/07/2015   CHOLHDL 3 06/07/2015  On Lipitor. - last eye exam was in 05/2015. No DR.  - + numbness and tingling in her feet (back surgery).  Pt has FH of DM in mother.  She also has  HTN, HL. Had back surgery 2007. Back pain increased 02/2015 >> saw Dr Tamala Julian >> started Gabapentin >> disequilibrium. She was on steroids then >> sugars higher. In 03/2015: spine steroid inj >> sugars high again.  ROS: Constitutional: no weight gain/loss, no fatigue, no subjective hyperthermia/hypothermia, + poor sleep Eyes: no blurry vision, no xerophthalmia ENT: no sore throat, no nodules palpated in throat, no dysphagia/odynophagia, no hoarseness Cardiovascular: no CP/SOB/palpitations/leg swelling Respiratory: no cough/SOB Gastrointestinal: no N/V/D/C Musculoskeletal: no muscle/joint aches Skin: no rashes, + hair loss Neurological: no tremors/numbness/tingling/dizziness Psychiatric: no depression/anxiety  Past Medical History  Diagnosis Date  . HTN (hypertension)   . DM (diabetes mellitus) ([place])   . Hyperlipidemia   . Colitis     in her 20s; quiescent since   Past Surgical History  Procedure Laterality Date  . Knee arthroscopy  1980    Right  . Foot surgery      bone spurs resected, Dr Aida Puffer  . Cervix surgery  2001    Cryosurgery, Dr Connye Burkitt  . Laminectomy  2007    X 2, Dr Gladstone Lighter  . Colonoscopy  2009    Negative, Dr. Delfin Edis   Social History   Social History  . Marital Status: Divorced    Spouse  Name: N/A  . Number of Children: 1   Occupational History  . 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 Smoking status: Former Smoker    Quit date: 1996  . Alcohol Use: Yes     Comment:  rarely  . Drug Use: No   Current Outpatient Prescriptions on File Prior to Visit  Medication Sig Dispense Refill  . aspirin 81 MG tablet Take 81 mg by mouth daily.      Marland Kitchen atorvastatin (LIPITOR) 40 MG tablet Take 1 tablet (40 mg total) by mouth as directed. 1/2 daily (Patient taking differently: Take 20 mg by mouth daily. ) 30 tablet 6  . benazepril (LOTENSIN) 40 MG tablet TAKE 1 TABLET (40 MG TOTAL) BY MOUTH DAILY. 90 tablet 1  . dapagliflozin propanediol  (FARXIGA) 5 MG TABS tablet Take 5 mg by mouth daily. 30 tablet 5  . Insulin Pen Needle (NOVOTWIST) 32G X 5 MM MISC 1 each by Does not apply route daily. 100 each 3  . metFORMIN (GLUCOPHAGE) 1000 MG tablet TAKE 1 BY MOUTH TWO TIMES DAILY 60 tablet 5  . metoprolol (LOPRESSOR) 50 MG tablet TAKE ONE-HALF TABLET BY MOUTH TWO TIMES A DAY 90 tablet 1  . metoprolol (LOPRESSOR) 50 MG tablet Take 1 tablet (50 mg total) by mouth 2 (two) times daily. --need to establish care with new PCP, call our office for appt 30 tablet 3  . ONE TOUCH ULTRA TEST test strip CHECK BLOOD SUGAR ONCE DAILY 250.00 50 each 3  . traMADol (ULTRAM) 50 MG tablet Take 1 tablet (50 mg total) by mouth every 12 (twelve) hours as needed. 40 tablet 0  . TRULICITY 5.10 CH/8.5ID SOPN INJECT TAKE 1/2 TABLET TWICE DAILY MILLILITER INTO SKIN ONCE WEEKLY 2 mL 3  . ZETIA 10 MG tablet TAKE 1 TABLET (10 MG TOTAL) BY MOUTH DAILY. 30 tablet 4  . [DISCONTINUED] Saxagliptin-Metformin (KOMBIGLYZE XR) 2.11-998 MG TB24 2 daily 30 tablet 2   No current facility-administered medications on file prior to visit.   Allergies  Allergen Reactions  . Atorvastatin     REACTION: ELEVATES LFT'S. Pt states she is currently taking Atorvastatin 40 mg (1/2 tablet daily) and does not recall hab=ving any issues with it. (07/14/14)  . Codeine Nausea Only       . Neomycin-Bacitracin Zn-Polymyx Rash  . Onglyza [Saxagliptin Hydrochloride] Rash    This occurred after starting Kombiglyze . This was present as a rash over the hands and feet dorsally. She has been able to take metformin in the past as well as Janumet without problems.  . Sulfamethoxazole-Trimethoprim Nausea And Vomiting     Nausea was extreme.     Family History  Problem Relation Age of Onset  . Diabetes Father   . Hypertension Father   . Heart attack Father 53    precipitated by bleeding ulcer  . Diabetes Mother   . Hypertension Mother   . Coronary artery disease Mother   . Diabetes Maternal  Grandmother   . Stroke Maternal Grandmother 82  . Heart attack Paternal Grandfather     ? 70  . Heart attack Maternal Grandfather 68  . Cancer Neg Hx    PE: BP 102/64 mmHg  Pulse 58  Temp(Src) 98 F (36.7 C)  Resp 20  Wt 218 lb 12.8 oz (99.247 kg)  SpO2 96% Body mass index is 32.3 kg/(m^2).  Wt Readings from Last 3 Encounters:  07/05/15 218 lb 12.8 oz (99.247 kg)  06/07/15 218 lb (98.884 kg)  03/10/15 219 lb (99.338 kg)   Constitutional: overweight, in NAD Eyes: PERRLA, EOMI, no exophthalmos ENT: moist mucous membranes, no thyromegaly, no cervical lymphadenopathy Cardiovascular: RRR, No MRG Respiratory: CTA B Gastrointestinal: abdomen soft, NT, ND, BS+ Musculoskeletal: no deformities, strength intact in all 4 Skin: moist, warm, no rashes Neurological: no tremor with outstretched hands, DTR normal in all 4  ASSESSMENT: 1. DM2, non-insulin-dependent, uncontrolled, without complications  2. Yeast vaginitis  PLAN:  1. Patient with long-standing, fairly wellcontrolled diabetes, on oral antidiabetic regimen, which became insufficient. Her sugars are high in am and higher after meals. She is on target dose metformin but half-max doses of Farxiga (GFR >85) and Trulicity >> will increase these to target dose. If sugars in am still high >> will change to Metformin XR and move the entire dose at dinnertime. - I suggested to:  Patient Instructions  Please continue: - Metformin 1000 mg 2x a day with lunch and dinner  Increase: - Trulicity to 1.5 mg weekly - Farxiga to 10 mg daily in am  Please return in 1.5 months with your sugar log.   Please let me know if the sugars are consistently <80 or >200.  - Strongly advised her to start checking sugars at different times of the day - check 1-2 times a day, rotating checks - given sugar log and advised how to fill it and to bring it at next appt  - given foot care handout and explained the principles  - given instructions for  hypoglycemia management "15-15 rule"  - advised for yearly eye exams  - Return to clinic in 1.5 mo with sugar log >> will check BMP and HbA1c then.  2. Yeast vaginitis - likely 2/2 Farxiga - will call in Diflucan 150 mg x1 with 1 refill

## 2015-07-12 ENCOUNTER — Encounter: Payer: Self-pay | Admitting: Internal Medicine

## 2015-07-22 ENCOUNTER — Other Ambulatory Visit: Payer: Self-pay | Admitting: Internal Medicine

## 2015-08-04 ENCOUNTER — Other Ambulatory Visit: Payer: Self-pay | Admitting: Obstetrics and Gynecology

## 2015-08-19 ENCOUNTER — Encounter: Payer: Self-pay | Admitting: Internal Medicine

## 2015-08-19 ENCOUNTER — Other Ambulatory Visit: Payer: Self-pay

## 2015-08-19 MED ORDER — GLUCOSE BLOOD VI STRP: ORAL_STRIP | Status: DC

## 2015-08-31 ENCOUNTER — Ambulatory Visit: Payer: 59 | Admitting: Internal Medicine

## 2015-09-02 ENCOUNTER — Other Ambulatory Visit: Payer: Self-pay | Admitting: Internal Medicine

## 2015-09-06 ENCOUNTER — Other Ambulatory Visit: Payer: Self-pay | Admitting: Internal Medicine

## 2015-09-06 NOTE — Telephone Encounter (Signed)
Patient is establishing in November with [facility].  Please send script in.

## 2015-09-06 NOTE — Telephone Encounter (Signed)
Left message advising patient to call back to establish care with new pcp----let Leylah Tarnow know when patient calls back to schedule appt so that zetia rx request can be called in to pharm

## 2015-10-13 ENCOUNTER — Encounter: Payer: Self-pay | Admitting: Internal Medicine

## 2015-10-13 ENCOUNTER — Ambulatory Visit (INDEPENDENT_AMBULATORY_CARE_PROVIDER_SITE_OTHER): Payer: 59 | Admitting: Internal Medicine

## 2015-10-13 ENCOUNTER — Other Ambulatory Visit (INDEPENDENT_AMBULATORY_CARE_PROVIDER_SITE_OTHER): Payer: 59 | Admitting: *Deleted

## 2015-10-13 VITALS — BP 122/62 | HR 91 | Temp 97.9°F | Resp 12 | Wt 221.0 lb

## 2015-10-13 DIAGNOSIS — E1151 Type 2 diabetes mellitus with diabetic peripheral angiopathy without gangrene: Secondary | ICD-10-CM

## 2015-10-13 DIAGNOSIS — E1165 Type 2 diabetes mellitus with hyperglycemia: Secondary | ICD-10-CM

## 2015-10-13 LAB — POCT GLYCOSYLATED HEMOGLOBIN (HGB A1C): Hemoglobin A1C: 7.4

## 2015-10-13 MED ORDER — METFORMIN HCL 1000 MG PO TABS: ORAL_TABLET | ORAL | Status: DC

## 2015-10-13 NOTE — Patient Instructions (Signed)
Please take all Metformin (2000 mg) with dinner.  Continue: - Trulicity 1.5 mg weekly - Farxiga 10 mg daily in am  Please return in 3 months with your sugar log.

## 2015-10-13 NOTE — Progress Notes (Signed)
Patient ID: [name], female   DOB: 01/26/1956, 60 y.o.   MRN: [id number]  HPI: [name] is a 60 y.o.-year-old female, returning for follow-up for DM2, dx in 2000, non-insulin-dependent, uncontrolled, without complications. Last visit 3 months ago.  Last hemoglobin A1c was: Lab Results  Component Value Date   HGBA1C 7.5* 06/07/2015   HGBA1C 7.0* 02/03/2015   HGBA1C 7.9* 10/15/2014   Pt is on a regimen of: - Metformin 1000 mg 2x a day with lunch and dinner - Trulicity 1.5 mg weekly - Farxiga 10 mg daily in am (may forget) She was on Victoza up to 1 year ago when insurance did not cover it. She was on Kombiglyze = Metformin + Onglyza (Saxagliptin) - 1000-2.5 mg daily >> rash  Pt checks her sugars 1-2x a day and they are: - am: 140-160s >> 127-180 - 2h after b'fast: n/c>> 221 - before lunch: n/c >> 138 - 2h after lunch: 160-180s, 210 >> 160-177 - before dinner: n/c >> 208 - 2h after dinner: n/c - bedtime: n/c - nighttime: n/c No lows. Lowest sugar was 140 >> 130s; she has hypoglycemia awareness at 80  Highest sugar was 210 >> 206.  Glucometer: One Touch Ultra  Pt's meals are: - Breakfast: yoghurt, hard boiled egg - Lunch: soup, sandwich - Dinner: varies - no red meat - late: 8-9 pm - Snacks: 2- cheese and crackers, yoghurt Exercise: cardio 3x a week.  - no CKD, last BUN/creatinine:  Lab Results  Component Value Date   BUN 21 06/07/2015   CREATININE 1.06 06/07/2015   Component     Latest Ref Rng 06/07/2015  Microalb, Ur     0.0 - 1.9 mg/dL 1.4  Creatinine,U      202.7  MICROALB/CREAT RATIO     0.0 - 30.0 mg/g 0.7  On benazepril. - last set of lipids: Lab Results  Component Value Date   CHOL 172 06/07/2015   HDL 53.10 06/07/2015   LDLCALC 105* 06/07/2015   LDLDIRECT 149.9 02/14/2010   TRIG 71.0 06/07/2015   CHOLHDL 3 06/07/2015  On Lipitor. - last eye exam was in 05/2015. No DR.  - + numbness and tingling in her feet (back surgery).  Pt has FH  of DM in mother.  She also has HTN, HL. Had back surgery 2007. Back pain increased 02/2015 >> saw Dr Tamala Julian >> started Gabapentin >> disequilibrium. She was on steroids then >> sugars higher. In 03/2015: spine steroid inj >> sugars high again.  ROS: Constitutional: no weight gain/loss, no fatigue, no subjective hyperthermia/hypothermia, + poor sleep Eyes: no blurry vision, no xerophthalmia ENT: no sore throat, no nodules palpated in throat, no dysphagia/odynophagia, no hoarseness Cardiovascular: no CP/SOB/palpitations/leg swelling Respiratory: no cough/SOB Gastrointestinal: no N/V/D/C Musculoskeletal: no muscle/joint aches Skin: no rashes, + hair loss Neurological: no tremors/numbness/tingling/dizziness  I reviewed pt's medications, allergies, PMH, social hx, family hx, and changes were documented in the history of present illness. Otherwise, unchanged from my initial visit note.  Past Medical History  Diagnosis Date  . HTN (hypertension)   . DM (diabetes mellitus) ([place])   . Hyperlipidemia   . Colitis     in her 60s; quiescent since   Past Surgical History  Procedure Laterality Date  . Knee arthroscopy  1980    Right  . Foot surgery      bone spurs resected, Dr Aida Puffer  . Cervix surgery  2001    Cryosurgery, Dr Connye Burkitt  . Laminectomy  2007  X 2, Dr Gladstone Lighter  . Colonoscopy  2009    Negative, Dr. Delfin Edis   Social History   Social History  . Marital Status: Divorced    Spouse Name: N/A  . Number of Children: 1   Occupational History  . 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 Smoking status: Former Smoker    Quit date: 1996  . Alcohol Use: Yes     Comment:  rarely  . Drug Use: No   Current Outpatient Prescriptions on File Prior to Visit  Medication Sig Dispense Refill  . aspirin 81 MG tablet Take 81 mg by mouth daily.      Marland Kitchen atorvastatin (LIPITOR) 40 MG tablet Take 1 tablet (40 mg total) by mouth as directed. 1/2 daily (Patient taking  differently: Take 20 mg by mouth daily. ) 30 tablet 6  . benazepril (LOTENSIN) 40 MG tablet TAKE 1 TABLET (40 MG TOTAL) BY MOUTH DAILY. 90 tablet 1  . benazepril (LOTENSIN) 40 MG tablet TAKE 1 TABLET (40 MG TOTAL) BY MOUTH DAILY. 30 tablet 4  . dapagliflozin propanediol (FARXIGA) 10 MG TABS tablet Take 10 mg by mouth daily. 30 tablet 5  . Dulaglutide (TRULICITY) 1.5 UU/7.2ZD SOPN Please inject 1.5 mg under skin once a week 4 pen 5  . ezetimibe (ZETIA) 10 MG tablet TAKE 1 TABLET (10 MG TOTAL) BY MOUTH DAILY. 30 tablet 3  . ezetimibe (ZETIA) 10 MG tablet TAKE 1 TABLET (10 MG TOTAL) BY MOUTH DAILY. 30 tablet 3  . fluconazole (DIFLUCAN) 150 MG tablet Take 1 tablet (150 mg total) by mouth once. 1 tablet 1  . glucose blood (ONETOUCH VERIO) test strip Use to check blood sugar 1 time per day. Dx Code E11.9 100 each 12  . hydrochlorothiazide (HYDRODIURIL) 25 MG tablet     . hydrochlorothiazide (HYDRODIURIL) 25 MG tablet TAKE 1/2 TAB BY MOUTH DAILY 15 tablet 5  . Insulin Pen Needle (NOVOTWIST) 32G X 5 MM MISC 1 each by Does not apply route daily. 100 each 3  . metFORMIN (GLUCOPHAGE) 1000 MG tablet TAKE 1 BY MOUTH TWO TIMES DAILY 60 tablet 5  . metoprolol (LOPRESSOR) 50 MG tablet TAKE ONE-HALF TABLET BY MOUTH TWO TIMES A DAY 90 tablet 1  . metoprolol (LOPRESSOR) 50 MG tablet Take 1 tablet (50 mg total) by mouth 2 (two) times daily. --need to establish care with new PCP, call our office for appt 30 tablet 3  . ONE TOUCH ULTRA TEST test strip CHECK BLOOD SUGAR ONCE DAILY 250.00 50 each 3  . traMADol (ULTRAM) 50 MG tablet Take 1 tablet (50 mg total) by mouth every 12 (twelve) hours as needed. 40 tablet 0  . [DISCONTINUED] Saxagliptin-Metformin (KOMBIGLYZE XR) 2.11-998 MG TB24 2 daily 30 tablet 2   No current facility-administered medications on file prior to visit.   Allergies  Allergen Reactions  . Atorvastatin     REACTION: ELEVATES LFT'S. Pt states she is currently taking Atorvastatin 40 mg (1/2 tablet  daily) and does not recall hab=ving any issues with it. (07/14/14)  . Codeine Nausea Only       . Neomycin-Bacitracin Zn-Polymyx Rash  . Onglyza [Saxagliptin Hydrochloride] Rash    This occurred after starting Kombiglyze . This was present as a rash over the hands and feet dorsally. She has been able to take metformin in the past as well as Janumet without problems.  . Sulfamethoxazole-Trimethoprim Nausea And Vomiting     Nausea was extreme.  Family History  Problem Relation Age of Onset  . Diabetes Father   . Hypertension Father   . Heart attack Father 30    precipitated by bleeding ulcer  . Diabetes Mother   . Hypertension Mother   . Coronary artery disease Mother   . Diabetes Maternal Grandmother   . Stroke Maternal Grandmother 82  . Heart attack Paternal Grandfather     ? 64  . Heart attack Maternal Grandfather 68  . Cancer Neg Hx    PE: BP 122/62 mmHg  Pulse 91  Temp(Src) 97.9 F (36.6 C) (Oral)  Resp 12  Wt 221 lb (100.245 kg)  SpO2 98% Body mass index is 32.62 kg/(m^2).  Wt Readings from Last 3 Encounters:  10/13/15 221 lb (100.245 kg)  07/05/15 218 lb 12.8 oz (99.247 kg)  06/07/15 218 lb (98.884 kg)   Constitutional: overweight, in NAD Eyes: PERRLA, EOMI, no exophthalmos ENT: moist mucous membranes, no thyromegaly, no cervical lymphadenopathy Cardiovascular: RRR, No MRG Respiratory: CTA B Gastrointestinal: abdomen soft, NT, ND, BS+ Musculoskeletal: no deformities, strength intact in all 4 Skin: moist, warm, no rashes Neurological: no tremor with outstretched hands, DTR normal in all 4  ASSESSMENT: 1. DM2, non-insulin-dependent, uncontrolled, without complications  PLAN:  1. Patient with long-standing, uncontrolled diabetes, on oral antidiabetic regimen + GLP1 R agonist, with still high sugars in am >> high sugars throughout the day >> move all Metformin at dinnertime >> I expect her sugars in am to improve by ~30 mg/dL. Improving sugars in am will  likely keep the rest of her sugars controlled, also. If this is not enough >> will consider a sulfonylurea - we also discussed dietary changes - I suggested to:  Patient Instructions  Please take all Metformin (2000 mg) with dinner.  Continue: - Trulicity 1.5 mg weekly - Farxiga 10 mg daily in am  Please return in 3 months with your sugar log.  - continue checking sugars at different times of the day - check 1-2 times a day, rotating checks - advised for yearly eye exams  - checked HbA1c today >> 7.4% (slightly better) - Return to clinic in 3 mo with sugar log

## 2015-10-27 ENCOUNTER — Other Ambulatory Visit: Payer: Self-pay | Admitting: Emergency Medicine

## 2015-10-27 MED ORDER — METFORMIN HCL 1000 MG PO TABS: ORAL_TABLET | ORAL | Status: DC

## 2015-11-29 ENCOUNTER — Other Ambulatory Visit: Payer: Self-pay | Admitting: Emergency Medicine

## 2015-11-29 DIAGNOSIS — I10 Essential (primary) hypertension: Secondary | ICD-10-CM

## 2015-11-29 MED ORDER — HYDROCHLOROTHIAZIDE 25 MG PO TABS: ORAL_TABLET | ORAL | Status: DC

## 2015-11-29 MED ORDER — METOPROLOL TARTRATE 50 MG PO TABS: ORAL_TABLET | ORAL | Status: DC

## 2016-01-07 ENCOUNTER — Other Ambulatory Visit: Payer: Self-pay | Admitting: Obstetrics and Gynecology

## 2016-01-13 ENCOUNTER — Ambulatory Visit: Payer: 59 | Admitting: Internal Medicine

## 2016-01-20 ENCOUNTER — Other Ambulatory Visit: Payer: Self-pay | Admitting: Internal Medicine

## 2016-01-25 ENCOUNTER — Other Ambulatory Visit: Payer: Self-pay | Admitting: Internal Medicine

## 2016-03-07 ENCOUNTER — Encounter: Payer: Self-pay | Admitting: Internal Medicine

## 2016-03-07 ENCOUNTER — Ambulatory Visit (INDEPENDENT_AMBULATORY_CARE_PROVIDER_SITE_OTHER): Payer: 59 | Admitting: Internal Medicine

## 2016-03-07 VITALS — BP 132/72 | HR 83 | Ht 68.0 in | Wt 217.0 lb

## 2016-03-07 DIAGNOSIS — E1165 Type 2 diabetes mellitus with hyperglycemia: Secondary | ICD-10-CM

## 2016-03-07 LAB — POCT GLYCOSYLATED HEMOGLOBIN (HGB A1C): HEMOGLOBIN A1C: 8.1

## 2016-03-07 MED ORDER — GLIPIZIDE 5 MG PO TABS: ORAL_TABLET | ORAL | 3 refills | Status: DC

## 2016-03-07 NOTE — Addendum Note (Signed)
Addended by: Ena Dawley on: 03/07/2016 04:38 PM   Modules accepted: Orders

## 2016-03-07 NOTE — Patient Instructions (Addendum)
Please continue: - Metformin 2000 mg with dinner. - Trulicity 1.5 mg weekly - Farxiga 10 mg daily in am  Start Glipizide 5 mg before dinner.  Please return in 3 months with your sugar log.

## 2016-03-07 NOTE — Progress Notes (Signed)
Patient ID: [name], female   DOB: 11-17-1955, 60 y.o.   MRN: [id number]  HPI: [name] is a 60 y.o.-year-old female, returning for follow-up for DM2, dx in 2000, non-insulin-dependent, uncontrolled, without complications. Last visit 3 months ago.  Last hemoglobin A1c was: Lab Results  Component Value Date   HGBA1C 7.4 10/13/2015   HGBA1C 7.5 (H) 06/07/2015   HGBA1C 7.0 (H) 02/03/2015   Pt is on a regimen of: - Metformin 2000 mg with dinner - Trulicity 1.5 mg weekly - Farxiga 10 mg daily in am (may forget) She was on Victoza up to 1 year ago when insurance did not cover it. She was on Kombiglyze = Metformin + Onglyza (Saxagliptin) - 1000-2.5 mg daily >> rash  Pt checks her sugars 0-1x a day: - am: 140-160s >> 127-180 >> 111-usually 140-150s, 203 - 2h after b'fast: n/c>> 221 >> n/c - before lunch: n/c >> 138 >> n/c - 2h after lunch: 160-180s, 210 >> 160-177 >> n/c - before dinner: n/c >> 208 >> n/c - 2h after dinner: n/c - bedtime: n/c - nighttime: n/c No lows. Lowest sugar was 140 >> 130s >> 111; she has hypoglycemia awareness at 80  Highest sugar was 210 >> 206 >> 203.  Glucometer: One Touch Ultra  Pt's meals are: - Breakfast: yoghurt, hard boiled egg - Lunch: soup, sandwich - Dinner: varies - no red meat - late: 8-9 pm - Snacks: 2- cheese and crackers, yoghurt Exercise: cardio 3x a week.  - no CKD, last BUN/creatinine:  Lab Results  Component Value Date   BUN 21 06/07/2015   CREATININE 1.06 06/07/2015   Component     Latest Ref Rng 06/07/2015  Microalb, Ur     0.0 - 1.9 mg/dL 1.4  Creatinine,U      202.7  MICROALB/CREAT RATIO     0.0 - 30.0 mg/g 0.7  On benazepril. - last set of lipids: Lab Results  Component Value Date   CHOL 172 06/07/2015   HDL 53.10 06/07/2015   LDLCALC 105 (H) 06/07/2015   LDLDIRECT 149.9 02/14/2010   TRIG 71.0 06/07/2015   CHOLHDL 3 06/07/2015  On Lipitor. - last eye exam was in 05/2015. No DR.  - + numbness and  tingling in her feet (back surgery).  She also has HTN, HL. Had back surgery 2007. Back pain increased 02/2015 >> saw Dr Tamala Julian >> started Gabapentin >> disequilibrium. She was on steroids then >> sugars higher. In 03/2015: spine steroid inj >> sugars high again.  ROS: Constitutional: no weight gain/loss, no fatigue, no subjective hyperthermia/hypothermia, + poor sleep Eyes: no blurry vision, no xerophthalmia ENT: no sore throat, no nodules palpated in throat, no dysphagia/odynophagia, no hoarseness Cardiovascular: no CP/SOB/palpitations/leg swelling Respiratory: no cough/SOB Gastrointestinal: no N/V/D/C Musculoskeletal: no muscle/joint aches Skin: no rashes, no hair loss Neurological: no tremors/numbness/tingling/dizziness  I reviewed pt's medications, allergies, PMH, social hx, family hx, and changes were documented in the history of present illness. Otherwise, unchanged from my initial visit note.She started estrace since last visit.  Past Medical History:  Diagnosis Date  . Colitis    in her 60s; quiescent since  . DM (diabetes mellitus) ([place])   . HTN (hypertension)   . Hyperlipidemia    Past Surgical History:  Procedure Laterality Date  . CERVIX SURGERY  2001   Cryosurgery, Dr Connye Burkitt  . COLONOSCOPY  2009   Negative, Dr. Delfin Edis  . FOOT SURGERY     bone spurs resected, Dr Aida Puffer  .  KNEE ARTHROSCOPY  1980   Right  . LAMINECTOMY  2007   X 2, Dr Gladstone Lighter   Social History   Social History  . Marital Status: Divorced    Spouse Name: N/A  . Number of Children: 1   Occupational History  . 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 Smoking status: Former Smoker    Quit date: 1996  . Alcohol Use: Yes     Comment:  rarely  . Drug Use: No   Current Outpatient Prescriptions on File Prior to Visit  Medication Sig Dispense Refill  . aspirin 81 MG tablet Take 81 mg by mouth daily.      Marland Kitchen atorvastatin (LIPITOR) 40 MG tablet Take 1 tablet (40 mg  total) by mouth as directed. 1/2 daily (Patient taking differently: Take 20 mg by mouth daily. ) 30 tablet 6  . benazepril (LOTENSIN) 40 MG tablet TAKE 1 TABLET (40 MG TOTAL) BY MOUTH DAILY. 90 tablet 1  . Dulaglutide (TRULICITY) 1.5 PT/4.6FK SOPN Please inject 1.5 mg under skin once a week 4 pen 5  . ezetimibe (ZETIA) 10 MG tablet TAKE 1 TABLET (10 MG TOTAL) BY MOUTH DAILY. 30 tablet 2  . FARXIGA 10 MG TABS tablet TAKE 10 MG BY MOUTH DAILY. 30 tablet 4  . glucose blood (ONETOUCH VERIO) test strip Use to check blood sugar 1 time per day. Dx Code E11.9 100 each 12  . hydrochlorothiazide (HYDRODIURIL) 25 MG tablet     . hydrochlorothiazide (HYDRODIURIL) 25 MG tablet TAKE 1/2 TAB BY MOUTH DAILY 15 tablet 2  . metFORMIN (GLUCOPHAGE) 1000 MG tablet TAKE 1 BY MOUTH TWO TIMES DAILY 180 tablet 1  . metoprolol (LOPRESSOR) 50 MG tablet TAKE ONE-HALF TABLET BY MOUTH TWO TIMES A DAY 90 tablet 1  . [DISCONTINUED] Saxagliptin-Metformin (KOMBIGLYZE XR) 2.11-998 MG TB24 2 daily 30 tablet 2   No current facility-administered medications on file prior to visit.    Allergies  Allergen Reactions  . Atorvastatin     REACTION: ELEVATES LFT'S. Pt states she is currently taking Atorvastatin 40 mg (1/2 tablet daily) and does not recall hab=ving any issues with it. (07/14/14)  . Codeine Nausea Only       . Neomycin-Bacitracin Zn-Polymyx Rash  . Onglyza [Saxagliptin Hydrochloride] Rash    This occurred after starting Kombiglyze . This was present as a rash over the hands and feet dorsally. She has been able to take metformin in the past as well as Janumet without problems.  . Sulfamethoxazole-Trimethoprim Nausea And Vomiting     Nausea was extreme.     Family History  Problem Relation Age of Onset  . Diabetes Father   . Hypertension Father   . Heart attack Father 46    precipitated by bleeding ulcer  . Diabetes Mother   . Hypertension Mother   . Coronary artery disease Mother   . Diabetes Maternal  Grandmother   . Stroke Maternal Grandmother 82  . Heart attack Paternal Grandfather     ? 17  . Heart attack Maternal Grandfather 68  . Cancer Neg Hx    PE: BP 132/72 (BP Location: Left Arm, Patient Position: Sitting)   Pulse 83   Ht 5' 8"  (1.727 m)   Wt 217 lb (98.4 kg)   SpO2 95%   BMI 32.99 kg/m  Body mass index is 32.99 kg/m.  Wt Readings from Last 3 Encounters:  03/07/16 217 lb (98.4 kg)  10/13/15 221 lb (100.2  kg)  07/05/15 218 lb 12.8 oz (99.2 kg)   Constitutional: overweight, in NAD Eyes: PERRLA, EOMI, no exophthalmos ENT: moist mucous membranes, no thyromegaly, no cervical lymphadenopathy Cardiovascular: RRR, No MRG Respiratory: CTA B Gastrointestinal: abdomen soft, NT, ND, BS+ Musculoskeletal: no deformities, strength intact in all 4 Skin: moist, warm, no rashes Neurological: no tremor with outstretched hands, DTR normal in all 4  ASSESSMENT: 1. DM2, non-insulin-dependent, uncontrolled, without complications  PLAN:  1. Patient with long-standing, uncontrolled diabetes, on oral antidiabetic regimen + GLP1 R agonist, with still high sugars, but she is not checking CBGs often. Will start a sulfonylurea. - we also discussed dietary changes - I suggested to:  Patient Instructions  Please continue: - Metformin 2000 mg with dinner. - Trulicity 1.5 mg weekly - Farxiga 10 mg daily in am  Start Glipizide 5 mg before dinner.  Please return in 3 months with your sugar log  - continue checking sugars at different times of the day - check 1-2 times a day, rotating checks - advised for yearly eye exams. She is UTD. - checked HbA1c today >> 8.1% (worse!) - Return to clinic in 3 mo with sugar log   Philemon Kingdom, MD PhD [facility] Endocrinology

## 2016-04-30 ENCOUNTER — Other Ambulatory Visit: Payer: Self-pay | Admitting: Internal Medicine

## 2016-04-30 DIAGNOSIS — I10 Essential (primary) hypertension: Secondary | ICD-10-CM

## 2016-05-06 ENCOUNTER — Other Ambulatory Visit: Payer: Self-pay | Admitting: Internal Medicine

## 2016-05-27 ENCOUNTER — Other Ambulatory Visit: Payer: Self-pay | Admitting: Internal Medicine

## 2016-06-05 ENCOUNTER — Other Ambulatory Visit: Payer: Self-pay | Admitting: Internal Medicine

## 2016-06-06 ENCOUNTER — Ambulatory Visit (INDEPENDENT_AMBULATORY_CARE_PROVIDER_SITE_OTHER): Payer: 59 | Admitting: Internal Medicine

## 2016-06-06 ENCOUNTER — Encounter: Payer: Self-pay | Admitting: Internal Medicine

## 2016-06-06 VITALS — BP 118/78 | HR 84 | Ht 69.25 in | Wt 224.0 lb

## 2016-06-06 DIAGNOSIS — L659 Nonscarring hair loss, unspecified: Secondary | ICD-10-CM

## 2016-06-06 DIAGNOSIS — E1165 Type 2 diabetes mellitus with hyperglycemia: Secondary | ICD-10-CM

## 2016-06-06 LAB — POCT GLYCOSYLATED HEMOGLOBIN (HGB A1C): HEMOGLOBIN A1C: 8.1

## 2016-06-06 NOTE — Patient Instructions (Addendum)
Please continue: - Metformin 2000 mg with dinner. - Trulicity 1.5 mg weekly - Farxiga 10 mg daily in am - Glipizide 5 mg before dinner.  Please check sugars later in the day, also.  Ask Dr. Quay Burow to also add a B12 level to your labs, if possible.  Please return in 3 months with your sugar log

## 2016-06-06 NOTE — Progress Notes (Signed)
Patient ID: [name], female   DOB: Jun 21, 1956, 60 y.o.   MRN: [id number]  HPI: [name] is a 60 y.o.-year-old female, returning for follow-up for DM2, dx in 2000, non-insulin-dependent, uncontrolled, without complications. Last visit 3 months ago.  Last hemoglobin A1c was: Lab Results  Component Value Date   HGBA1C 8.1 03/07/2016   HGBA1C 7.4 10/13/2015   HGBA1C 7.5 (H) 06/07/2015   Pt is on a regimen of: - Metformin 2000 mg with dinner - Trulicity 1.5 mg weekly - Farxiga 10 mg daily in am (may forget) - Glipizide 5 mg before dinner >> added 02/2016 - but she forgets this as she may not be home for dinner. She was on Victoza up to 1 year ago when insurance did not cover it. She was on Kombiglyze = Metformin + Onglyza (Saxagliptin) - 1000-2.5 mg daily >> rash  Pt checks her sugars 0-1x a day: - am: 140-160s >> 127-180 >> 111-usually 140-150s, 203 >> 117, 120, 140-160 - 2h after b'fast: n/c>> 221 >> n/c - before lunch: n/c >> 138 >> n/c - 2h after lunch: 160-180s, 210 >> 160-177 >> n/c - before dinner: n/c >> 208 >> n/c - 2h after dinner: n/c - bedtime: n/c - nighttime: n/c No lows. Lowest sugar was 140 >> 130s >> 111 >> 117; she has hypoglycemia awareness at 80  Highest sugar was 210 >> 206 >> 203 >> 240 x1.  Glucometer: One Touch Ultra  Pt's meals are: - Breakfast: yoghurt, hard boiled egg - Lunch: soup, sandwich - Dinner: varies - no red meat - late: 8-9 pm - Snacks: 2- cheese and crackers, yoghurt Exercise: cardio 3x a week.  - no CKD, last BUN/creatinine:  Lab Results  Component Value Date   BUN 21 06/07/2015   CREATININE 1.06 06/07/2015   Component     Latest Ref Rng 06/07/2015  Microalb, Ur     0.0 - 1.9 mg/dL 1.4  Creatinine,U      202.7  MICROALB/CREAT RATIO     0.0 - 30.0 mg/g 0.7  On benazepril. - last set of lipids: Lab Results  Component Value Date   CHOL 172 06/07/2015   HDL 53.10 06/07/2015   LDLCALC 105 (H) 06/07/2015   LDLDIRECT  149.9 02/14/2010   TRIG 71.0 06/07/2015   CHOLHDL 3 06/07/2015  On Lipitor. - last eye exam was in 05/2015. No DR. Coming up. - + numbness and tingling in her feet (back surgery).  She also has HTN, HL. Had back surgery 2007. Back pain increased 02/2015 >> saw Dr Tamala Julian >> started Gabapentin >> disequilibrium. She was on steroids then >> sugars higher. In 03/2015: spine steroid inj >> sugars high again.  Started was started on Estrace cream.   ROS: Constitutional: no weight gain/loss, no fatigue, no subjective hyperthermia/hypothermia Eyes: no blurry vision, no xerophthalmia ENT: no sore throat, no nodules palpated in throat, no dysphagia/odynophagia, no hoarseness Cardiovascular: no CP/SOB/palpitations/leg swelling Respiratory: no cough/SOB Gastrointestinal: no N/V/D/C Musculoskeletal: no muscle/joint aches Skin: no rashes, + hair loss Neurological: no tremors/numbness/tingling/dizziness  I reviewed pt's medications, allergies, PMH, social hx, family hx, and changes were documented in the history of present illness. Otherwise, unchanged from my initial visit note.She started estrace since last visit.  Past Medical History:  Diagnosis Date  . Colitis    in her 84s; quiescent since  . DM (diabetes mellitus) ([place])   . HTN (hypertension)   . Hyperlipidemia    Past Surgical History:  Procedure Laterality  Date  . CERVIX SURGERY  2001   Cryosurgery, Dr Connye Burkitt  . COLONOSCOPY  2009   Negative, Dr. Delfin Edis  . FOOT SURGERY     bone spurs resected, Dr Aida Puffer  . KNEE ARTHROSCOPY  1980   Right  . LAMINECTOMY  2007   X 2, Dr Gladstone Lighter   Social History   Social History  . Marital Status: Divorced    Spouse Name: N/A  . Number of Children: 1   Occupational History  . 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 Smoking status: Former Smoker    Quit date: 1996  . Alcohol Use: Yes     Comment:  rarely  . Drug Use: No   Current Outpatient Prescriptions  on File Prior to Visit  Medication Sig Dispense Refill  . aspirin 81 MG tablet Take 81 mg by mouth daily.      Marland Kitchen atorvastatin (LIPITOR) 40 MG tablet Take 1 tablet (40 mg total) by mouth as directed. 1/2 daily (Patient taking differently: Take 20 mg by mouth daily. ) 30 tablet 6  . benazepril (LOTENSIN) 40 MG tablet TAKE 1 TABLET (40 MG TOTAL) BY MOUTH DAILY. 90 tablet 1  . Dulaglutide (TRULICITY) 1.5 OH/6.0VP SOPN Please inject 1.5 mg under skin once a week 4 pen 5  . estradiol (ESTRACE) 0.1 MG/GM vaginal cream Place 1 Applicatorful vaginally at bedtime. Once weekly for 1 month, then .5 ml monthly.    . ezetimibe (ZETIA) 10 MG tablet TAKE 1 TABLET (10 MG TOTAL) BY MOUTH DAILY. 30 tablet 0  . FARXIGA 10 MG TABS tablet TAKE 10 MG BY MOUTH DAILY. 30 tablet 4  . glipiZIDE (GLUCOTROL) 5 MG tablet Take 1 tablet 15-30 min before dinner 90 tablet 3  . glucose blood (ONETOUCH VERIO) test strip Use to check blood sugar 1 time per day. Dx Code E11.9 100 each 12  . hydrochlorothiazide (HYDRODIURIL) 25 MG tablet TAKE 1/2 TAB BY MOUTH DAILY 15 tablet 2  . metFORMIN (GLUCOPHAGE) 1000 MG tablet TAKE ONE TABLET BY MOUTH TWO TIMES A DAY 60 tablet 0  . metoprolol (LOPRESSOR) 50 MG tablet TAKE 1/2 TABLET BY MOUTH TWO TIMES A DAY 30 tablet 0  . [DISCONTINUED] Saxagliptin-Metformin (KOMBIGLYZE XR) 2.11-998 MG TB24 2 daily 30 tablet 2   No current facility-administered medications on file prior to visit.    Allergies  Allergen Reactions  . Atorvastatin     REACTION: ELEVATES LFT'S. Pt states she is currently taking Atorvastatin 40 mg (1/2 tablet daily) and does not recall hab=ving any issues with it. (07/14/14)  . Codeine Nausea Only       . Neomycin-Bacitracin Zn-Polymyx Rash  . Onglyza [Saxagliptin Hydrochloride] Rash    This occurred after starting Kombiglyze . This was present as a rash over the hands and feet dorsally. She has been able to take metformin in the past as well as Janumet without problems.  .  Sulfamethoxazole-Trimethoprim Nausea And Vomiting     Nausea was extreme.     Family History  Problem Relation Age of Onset  . Diabetes Father   . Hypertension Father   . Heart attack Father 63    precipitated by bleeding ulcer  . Diabetes Mother   . Hypertension Mother   . Coronary artery disease Mother   . Diabetes Maternal Grandmother   . Stroke Maternal Grandmother 82  . Heart attack Paternal Grandfather     ? 13  . Heart attack Maternal  Grandfather 68  . Cancer Neg Hx    PE: BP 118/78   Pulse 84   Ht 5' 9.25" (1.759 m)   Wt 224 lb (101.6 kg)   SpO2 99%   BMI 32.84 kg/m  Body mass index is 32.84 kg/m.  Wt Readings from Last 3 Encounters:  06/06/16 224 lb (101.6 kg)  03/07/16 217 lb (98.4 kg)  10/13/15 221 lb (100.2 kg)   Constitutional: overweight, in NAD Eyes: PERRLA, EOMI, no exophthalmos ENT: moist mucous membranes, no thyromegaly, no cervical lymphadenopathy Cardiovascular: RRR, No MRG Respiratory: CTA B Gastrointestinal: abdomen soft, NT, ND, BS+ Musculoskeletal: no deformities, strength intact in all 4 Skin: moist, warm, no rashes Neurological: no tremor with outstretched hands, DTR normal in all 4  ASSESSMENT: 1. DM2, non-insulin-dependent, uncontrolled, without complications  2. Hair loss  PLAN:  1. Patient with long-standing, uncontrolled diabetes, on oral antidiabetic regimen + GLP1 R agonist and started a sulfonylurea at last visit (but sh misses this most of the times). - we also discussed dietary changes at last visit >> sugars are still too high. Discussed about the importance to check sugars later in the day, also and to take the meds as advised. - I suggested to:  Patient Instructions  Please continue: - Metformin 2000 mg with dinner. - Trulicity 1.5 mg weekly - Farxiga 10 mg daily in am - Glipizide 5 mg before dinner.  Please return in 3 months with your sugar log  - continue checking sugars at different times of the day - check 1-2  times a day, rotating checks - advised for yearly eye exams. She needs one. - She also has an appt for APE next mo >> will have repeat labs then.  - UTD with flu shot - checked HbA1c today >> 8.1% (stable) - Return to clinic in 3 mo with sugar log   2. Hair loss: - will need to check a B12 as she is on Metformin >> will try to add this for the next lab draw  Philemon Kingdom, MD PhD [facility] Endocrinology

## 2016-06-07 ENCOUNTER — Encounter: Payer: 59 | Admitting: Internal Medicine

## 2016-06-10 ENCOUNTER — Other Ambulatory Visit: Payer: Self-pay | Admitting: Internal Medicine

## 2016-06-11 ENCOUNTER — Other Ambulatory Visit: Payer: Self-pay | Admitting: Internal Medicine

## 2016-06-12 ENCOUNTER — Other Ambulatory Visit: Payer: Self-pay

## 2016-06-12 MED ORDER — DULAGLUTIDE 1.5 MG/0.5ML ~~LOC~~ SOAJ: SUBCUTANEOUS | 5 refills | Status: DC

## 2016-06-13 ENCOUNTER — Other Ambulatory Visit: Payer: Self-pay | Admitting: Internal Medicine

## 2016-06-14 ENCOUNTER — Telehealth: Payer: Self-pay | Admitting: Family Medicine

## 2016-06-14 NOTE — Telephone Encounter (Signed)
Patient states when she last seen Dr. Tamala Julian he offered to complete a parking placard application for her. Patient declined at the time but now would like to have one completed due to her back pain.  Please follow up with patient in regard.

## 2016-06-15 NOTE — Telephone Encounter (Signed)
Left msg making pt aware the card is at the front desk ready to be picked up & she will need to scheduled a f/u appt with dr Tamala Julian.

## 2016-07-02 NOTE — Patient Instructions (Addendum)
Test(s) ordered today. Your results will be released to MyChart (or called to you) after review, usually within 72hours after test completion. If any changes need to be made, you will be notified at that same time.  All other Health Maintenance issues reviewed.   All recommended immunizations and age-appropriate screenings are up-to-date or discussed.  No immunizations administered today.   Medications reviewed and updated.  No changes recommended at this time.   Please followup in one year   Health Maintenance, Female Introduction Adopting a healthy lifestyle and getting preventive care can go a long way to promote health and wellness. Talk with your health care provider about what schedule of regular examinations is right for you. This is a good chance for you to check in with your provider about disease prevention and staying healthy. In between checkups, there are plenty of things you can do on your own. Experts have done a lot of research about which lifestyle changes and preventive measures are most likely to keep you healthy. Ask your health care provider for more information. Weight and diet Eat a healthy diet  Be sure to include plenty of vegetables, fruits, low-fat dairy products, and lean protein.  Do not eat a lot of foods high in solid fats, added sugars, or salt.  Get regular exercise. This is one of the most important things you can do for your health.  Most adults should exercise for at least 150 minutes each week. The exercise should increase your heart rate and make you sweat (moderate-intensity exercise).  Most adults should also do strengthening exercises at least twice a week. This is in addition to the moderate-intensity exercise. Maintain a healthy weight  Body mass index (BMI) is a measurement that can be used to identify possible weight problems. It estimates body fat based on height and weight. Your health care provider can help determine your BMI and help  you achieve or maintain a healthy weight.  For females 20 years of age and older:  A BMI below 18.5 is considered underweight.  A BMI of 18.5 to 24.9 is normal.  A BMI of 25 to 29.9 is considered overweight.  A BMI of 30 and above is considered obese. Watch levels of cholesterol and blood lipids  You should start having your blood tested for lipids and cholesterol at 60 years of age, then have this test every 5 years.  You may need to have your cholesterol levels checked more often if:  Your lipid or cholesterol levels are high.  You are older than 60 years of age.  You are at high risk for heart disease. Cancer screening Lung Cancer  Lung cancer screening is recommended for adults 55-80 years old who are at high risk for lung cancer because of a history of smoking.  A yearly low-dose CT scan of the lungs is recommended for people who:  Currently smoke.  Have quit within the past 15 years.  Have at least a 30-pack-year history of smoking. A pack year is smoking an average of one pack of cigarettes a day for 1 year.  Yearly screening should continue until it has been 15 years since you quit.  Yearly screening should stop if you develop a health problem that would prevent you from having lung cancer treatment. Breast Cancer  Practice breast self-awareness. This means understanding how your breasts normally appear and feel.  It also means doing regular breast self-exams. Let your health care provider know about any changes, no matter how   small.  If you are in your 20s or 30s, you should have a clinical breast exam (CBE) by a health care provider every 1-3 years as part of a regular health exam.  If you are 40 or older, have a CBE every year. Also consider having a breast X-ray (mammogram) every year.  If you have a family history of breast cancer, talk to your health care provider about genetic screening.  If you are at high risk for breast cancer, talk to your health  care provider about having an MRI and a mammogram every year.  Breast cancer gene (BRCA) assessment is recommended for women who have family members with BRCA-related cancers. BRCA-related cancers include:  Breast.  Ovarian.  Tubal.  Peritoneal cancers.  Results of the assessment will determine the need for genetic counseling and BRCA1 and BRCA2 testing. Cervical Cancer  Your health care provider may recommend that you be screened regularly for cancer of the pelvic organs (ovaries, uterus, and vagina). This screening involves a pelvic examination, including checking for microscopic changes to the surface of your cervix (Pap test). You may be encouraged to have this screening done every 3 years, beginning at age 21.  For women ages 30-65, health care providers may recommend pelvic exams and Pap testing every 3 years, or they may recommend the Pap and pelvic exam, combined with testing for human papilloma virus (HPV), every 5 years. Some types of HPV increase your risk of cervical cancer. Testing for HPV may also be done on women of any age with unclear Pap test results.  Other health care providers may not recommend any screening for nonpregnant women who are considered low risk for pelvic cancer and who do not have symptoms. Ask your health care provider if a screening pelvic exam is right for you.  If you have had past treatment for cervical cancer or a condition that could lead to cancer, you need Pap tests and screening for cancer for at least 20 years after your treatment. If Pap tests have been discontinued, your risk factors (such as having a new sexual partner) need to be reassessed to determine if screening should resume. Some women have medical problems that increase the chance of getting cervical cancer. In these cases, your health care provider may recommend more frequent screening and Pap tests. Colorectal Cancer  This type of cancer can be detected and often prevented.  Routine  colorectal cancer screening usually begins at 60 years of age and continues through 60 years of age.  Your health care provider may recommend screening at an earlier age if you have risk factors for colon cancer.  Your health care provider may also recommend using home test kits to check for hidden blood in the stool.  A small camera at the end of a tube can be used to examine your colon directly (sigmoidoscopy or colonoscopy). This is done to check for the earliest forms of colorectal cancer.  Routine screening usually begins at age 50.  Direct examination of the colon should be repeated every 5-10 years through 60 years of age. However, you may need to be screened more often if early forms of precancerous polyps or small growths are found. Skin Cancer  Check your skin from head to toe regularly.  Tell your health care provider about any new moles or changes in moles, especially if there is a change in a mole's shape or color.  Also tell your health care provider if you have a mole that is   the size of a pencil eraser.  Always use sunscreen. Apply sunscreen liberally and repeatedly throughout the day.  Protect yourself by wearing long sleeves, pants, a wide-brimmed hat, and sunglasses whenever you are outside. Heart disease, diabetes, and high blood pressure  High blood pressure causes heart disease and increases the risk of stroke. High blood pressure is more likely to develop in:  People who have blood pressure in the high end of the normal range (130-139/85-89 mm Hg).  People who are overweight or obese.  People who are African American.  If you are 41-71 years of age, have your blood pressure checked every 3-5 years. If you are 94 years of age or older, have your blood pressure checked every year. You should have your blood pressure measured twice-once when you are at a hospital or clinic, and once when you are not at a hospital or clinic. Record the average of the two  measurements. To check your blood pressure when you are not at a hospital or clinic, you can use:  An automated blood pressure machine at a pharmacy.  A home blood pressure monitor.  If you are between 81 years and 22 years old, ask your health care provider if you should take aspirin to prevent strokes.  Have regular diabetes screenings. This involves taking a blood sample to check your fasting blood sugar level.  If you are at a normal weight and have a low risk for diabetes, have this test once every three years after 60 years of age.  If you are overweight and have a high risk for diabetes, consider being tested at a younger age or more often. Preventing infection Hepatitis B  If you have a higher risk for hepatitis B, you should be screened for this virus. You are considered at high risk for hepatitis B if:  You were born in a country where hepatitis B is common. Ask your health care provider which countries are considered high risk.  Your parents were born in a high-risk country, and you have not been immunized against hepatitis B (hepatitis B vaccine).  You have HIV or AIDS.  You use needles to inject street drugs.  You live with someone who has hepatitis B.  You have had sex with someone who has hepatitis B.  You get hemodialysis treatment.  You take certain medicines for conditions, including cancer, organ transplantation, and autoimmune conditions. Hepatitis C  Blood testing is recommended for:  Everyone born from [birth year] through 1965.  Anyone with known risk factors for hepatitis C. Sexually transmitted infections (STIs)  You should be screened for sexually transmitted infections (STIs) including gonorrhea and chlamydia if:  You are sexually active and are younger than 60 years of age.  You are older than 60 years of age and your health care provider tells you that you are at risk for this type of infection.  Your sexual activity has changed since you were last  screened and you are at an increased risk for chlamydia or gonorrhea. Ask your health care provider if you are at risk.  If you do not have HIV, but are at risk, it may be recommended that you take a prescription medicine daily to prevent HIV infection. This is called pre-exposure prophylaxis (PrEP). You are considered at risk if:  You are sexually active and do not regularly use condoms or know the HIV status of your partner(s).  You take drugs by injection.  You are sexually active with a partner who has  HIV. Talk with your health care provider about whether you are at high risk of being infected with HIV. If you choose to begin PrEP, you should first be tested for HIV. You should then be tested every 3 months for as long as you are taking PrEP. Pregnancy  If you are premenopausal and you may become pregnant, ask your health care provider about preconception counseling.  If you may become pregnant, take 400 to 800 micrograms (mcg) of folic acid every day.  If you want to prevent pregnancy, talk to your health care provider about birth control (contraception). Osteoporosis and menopause  Osteoporosis is a disease in which the bones lose minerals and strength with aging. This can result in serious bone fractures. Your risk for osteoporosis can be identified using a bone density scan.  If you are 76 years of age or older, or if you are at risk for osteoporosis and fractures, ask your health care provider if you should be screened.  Ask your health care provider whether you should take a calcium or vitamin D supplement to lower your risk for osteoporosis.  Menopause may have certain physical symptoms and risks.  Hormone replacement therapy may reduce some of these symptoms and risks. Talk to your health care provider about whether hormone replacement therapy is right for you. Follow these instructions at home:  Schedule regular health, dental, and eye exams.  Stay current with your  immunizations.  Do not use any tobacco products including cigarettes, chewing tobacco, or electronic cigarettes.  If you are pregnant, do not drink alcohol.  If you are breastfeeding, limit how much and how often you drink alcohol.  Limit alcohol intake to no more than 1 drink per day for nonpregnant women. One drink equals 12 ounces of beer, 5 ounces of wine, or 1 ounces of hard liquor.  Do not use street drugs.  Do not share needles.  Ask your health care provider for help if you need support or information about quitting drugs.  Tell your health care provider if you often feel depressed.  Tell your health care provider if you have ever been abused or do not feel safe at home. This information is not intended to replace advice given to you by your health care provider. Make sure you discuss any questions you have with your health care provider. Document Released: 01/16/2011 Document Revised: 12/09/2015 Document Reviewed: 04/06/2015  2017 [employer]

## 2016-07-02 NOTE — Progress Notes (Signed)
Subjective:    Patient ID: [name], female    DOB: 03-May-1956, 60 y.o.   MRN: [id number]  HPI She is here to establish with a new pcp.  She is here for a physical exam.   Diabetes: she follows with Dr Cruzita Lederer.  She is taking her medication daily as prescribed. She is compliant with a diabetic diet. She is exercising regularly. She checks her feet daily and denies foot lesions. She is up-to-date with an ophthalmology examination.   Hyperlipidemia: She is taking her medication daily. She is compliant with a low fat/cholesterol diet. She is exercising regularly. She denies myalgias.   Hypertension: She is taking her medication daily. She is compliant with a low sodium diet.  She denies chest pain, palpitations, edema, shortness of breath and regular headaches. She is exercising regularly.  She does not monitor her blood pressure at home.     Medications and allergies reviewed with patient and updated if appropriate.  Patient Active Problem List   Diagnosis Date Noted  . Hair thinning 07/03/2016  . Type 2 diabetes mellitus with hyperglycemia, without long-term current use of insulin ([place]) 07/05/2015  . Lumbar back pain with radiculopathy affecting left lower extremity 02/22/2015  . PVC's (premature ventricular contractions) 06/09/2014  . Essential hypertension 02/14/2010  . Type 2 diabetes mellitus with peripheral vascular disease ([place]) 11/21/2006  . Hyperlipidemia 11/21/2006    Current Outpatient Prescriptions on File Prior to Visit  Medication Sig Dispense Refill  . aspirin 81 MG tablet Take 81 mg by mouth daily.      . benazepril (LOTENSIN) 40 MG tablet TAKE 1 TABLET (40 MG TOTAL) BY MOUTH DAILY. 30 tablet 0  . estradiol (ESTRACE) 0.1 MG/GM vaginal cream Place 1 Applicatorful vaginally at bedtime. Once weekly for 1 month, then .5 ml monthly.    . ezetimibe (ZETIA) 10 MG tablet TAKE 1 TABLET (10 MG TOTAL) BY MOUTH DAILY. 30 tablet 0  . FARXIGA 10 MG TABS tablet TAKE 10 MG BY  MOUTH DAILY. 30 tablet 4  . glipiZIDE (GLUCOTROL) 5 MG tablet Take 1 tablet 15-30 min before dinner 90 tablet 3  . glucose blood (ONETOUCH VERIO) test strip Use to check blood sugar 1 time per day. Dx Code E11.9 100 each 12  . hydrochlorothiazide (HYDRODIURIL) 25 MG tablet TAKE 1/2 TAB BY MOUTH DAILY 15 tablet 2  . metFORMIN (GLUCOPHAGE) 1000 MG tablet TAKE ONE TABLET BY MOUTH TWO TIMES A DAY 60 tablet 0  . metoprolol (LOPRESSOR) 50 MG tablet TAKE 1/2 TABLET BY MOUTH TWO TIMES A DAY 30 tablet 0  . TRULICITY 1.5 NW/2.9FA SOPN PLEASE INJECT 1.5 MG UNDER SKIN ONCE A WEEK 4 pen 4  . [DISCONTINUED] Saxagliptin-Metformin (KOMBIGLYZE XR) 2.11-998 MG TB24 2 daily 30 tablet 2   No current facility-administered medications on file prior to visit.     Past Medical History:  Diagnosis Date  . Colitis    in her 42s; quiescent since  . DM (diabetes mellitus) ([place])   . HTN (hypertension)   . Hyperlipidemia     Past Surgical History:  Procedure Laterality Date  . CERVIX SURGERY  2001   Cryosurgery, Dr Connye Burkitt  . COLONOSCOPY  2009   Negative, Dr. Delfin Edis  . FOOT SURGERY     bone spurs resected, Dr Aida Puffer  . KNEE ARTHROSCOPY  1980   Right  . LAMINECTOMY  2007   X 2, Dr Gladstone Lighter    Social History   Social History  .  Marital status: Divorced    Spouse name: N/A  . Number of children: N/A  . Years of education: N/A   Social History Main Topics  . Smoking status: Former Smoker    Quit date: 07/17/1977  . Smokeless tobacco: Never Used     Comment: Smoked in [place] ; [phone], up to 1/2 ppd  . Alcohol use Yes     Comment:  rarely  . Drug use: No  . Sexual activity: Not Asked   Other Topics Concern  . None   Social History Narrative  . None    Family History  Problem Relation Age of Onset  . Diabetes Father   . Hypertension Father   . Heart attack Father 71    precipitated by bleeding ulcer  . Diabetes Mother   . Hypertension Mother   . Coronary artery disease  Mother   . Heart attack Paternal Grandfather     ? 43  . Heart attack Maternal Grandfather 68  . Diabetes Maternal Grandmother   . Stroke Maternal Grandmother 82  . Cancer Neg Hx     Review of Systems  Constitutional: Negative for chills and fever.  HENT: Negative for hearing loss.   Eyes: Negative for visual disturbance.  Respiratory: Negative for cough, shortness of breath and wheezing.   Cardiovascular: Positive for palpitations (PVCs). Negative for chest pain and leg swelling.  Gastrointestinal: Negative for abdominal pain, blood in stool, constipation, diarrhea and nausea.       No gerd  Genitourinary: Negative for dysuria and hematuria.  Musculoskeletal: Positive for arthralgias (sometimes hip pain - ? related to back, stiffness after sitting -resolves quickly) and back pain (with prolonged standing - daily).  Skin: Negative for color change and rash.  Neurological: Positive for numbness (chronic in feet from back surgery). Negative for dizziness, light-headedness and headaches.  Psychiatric/Behavioral: Negative for dysphoric mood. The patient is not nervous/anxious.        Objective:   Vitals:   07/03/16 1305  BP: 116/70  Pulse: (!) 113  Resp: 16  Temp: 98.3 F (36.8 C)   Filed Weights   07/03/16 1305  Weight: 222 lb (100.7 kg)   Body mass index is 32.78 kg/m.   Physical Exam Constitutional: She appears well-developed and well-nourished. No distress.  HENT:  Head: Normocephalic and atraumatic.  Right Ear: External ear normal. Normal ear canal and TM Left Ear: External ear normal.  Normal ear canal and TM Mouth/Throat: Oropharynx is clear and moist.  Eyes: Conjunctivae and EOM are normal.  Neck: Neck supple. No tracheal deviation present. No thyromegaly present.  No carotid bruit  Cardiovascular: Normal rate, regular rhythm and normal heart sounds.   No murmur heard.  No edema. Pulmonary/Chest: Effort normal and breath sounds normal. No respiratory  distress. She has no wheezes. She has no rales.  Breast: deferred to Gyn Abdominal: Soft. She exhibits no distension. There is no tenderness.  Lymphadenopathy: She has no cervical adenopathy.  Skin: Skin is warm and dry. She is not diaphoretic.  Psychiatric: She has a normal mood and affect. Her behavior is normal.         Assessment & Plan:    Physical exam: Screening blood work  ordered Immunizations   Up to date  Colonoscopy  Up to date  Mammogram   - due next year Gyn  Up to date  Eye exams- due - will schedule Exercise - some - stressed regular exercise Weight - work on weight loss Skin  -  no concerns Substance abuse- none  See Problem List for Assessment and Plan of chronic medical problems.

## 2016-07-03 ENCOUNTER — Ambulatory Visit (INDEPENDENT_AMBULATORY_CARE_PROVIDER_SITE_OTHER): Payer: 59 | Admitting: Internal Medicine

## 2016-07-03 ENCOUNTER — Encounter: Payer: Self-pay | Admitting: Internal Medicine

## 2016-07-03 VITALS — BP 116/70 | HR 113 | Temp 98.3°F | Resp 16 | Ht 69.0 in | Wt 222.0 lb

## 2016-07-03 DIAGNOSIS — E7800 Pure hypercholesterolemia, unspecified: Secondary | ICD-10-CM

## 2016-07-03 DIAGNOSIS — Z1159 Encounter for screening for other viral diseases: Secondary | ICD-10-CM

## 2016-07-03 DIAGNOSIS — E1165 Type 2 diabetes mellitus with hyperglycemia: Secondary | ICD-10-CM

## 2016-07-03 DIAGNOSIS — L659 Nonscarring hair loss, unspecified: Secondary | ICD-10-CM | POA: Insufficient documentation

## 2016-07-03 DIAGNOSIS — I10 Essential (primary) hypertension: Secondary | ICD-10-CM | POA: Diagnosis not present

## 2016-07-03 DIAGNOSIS — Z0000 Encounter for general adult medical examination without abnormal findings: Secondary | ICD-10-CM | POA: Diagnosis not present

## 2016-07-03 NOTE — Assessment & Plan Note (Signed)
Check B12 level given she is on metformin

## 2016-07-03 NOTE — Assessment & Plan Note (Signed)
Lab Results  Component Value Date   HGBA1C 8.1 06/06/2016    Sugars improving with adjusting how she takes her medication Regular exercise Work on weight loss

## 2016-07-03 NOTE — Progress Notes (Signed)
Pre visit review using our clinic review tool, if applicable. No additional management support is needed unless otherwise documented below in the visit note. 

## 2016-07-03 NOTE — Assessment & Plan Note (Signed)
BP well controlled Current regimen effective and well tolerated Continue current medications at current doses cmp  

## 2016-07-03 NOTE — Assessment & Plan Note (Signed)
Check lipid panel  Continue daily statin Regular exercise and healthy diet encouraged  

## 2016-07-04 ENCOUNTER — Other Ambulatory Visit: Payer: Self-pay | Admitting: *Deleted

## 2016-07-04 MED ORDER — ATORVASTATIN CALCIUM 40 MG PO TABS: 40.0000 mg | ORAL_TABLET | Freq: Every day | ORAL | 3 refills | Status: DC

## 2016-07-06 ENCOUNTER — Other Ambulatory Visit (INDEPENDENT_AMBULATORY_CARE_PROVIDER_SITE_OTHER): Payer: 59

## 2016-07-06 ENCOUNTER — Other Ambulatory Visit: Payer: Self-pay | Admitting: Internal Medicine

## 2016-07-06 DIAGNOSIS — L659 Nonscarring hair loss, unspecified: Secondary | ICD-10-CM | POA: Diagnosis not present

## 2016-07-06 DIAGNOSIS — Z1159 Encounter for screening for other viral diseases: Secondary | ICD-10-CM

## 2016-07-06 DIAGNOSIS — E1165 Type 2 diabetes mellitus with hyperglycemia: Secondary | ICD-10-CM | POA: Diagnosis not present

## 2016-07-06 DIAGNOSIS — E7800 Pure hypercholesterolemia, unspecified: Secondary | ICD-10-CM | POA: Diagnosis not present

## 2016-07-06 DIAGNOSIS — Z0000 Encounter for general adult medical examination without abnormal findings: Secondary | ICD-10-CM | POA: Diagnosis not present

## 2016-07-06 LAB — CBC WITH DIFFERENTIAL/PLATELET
BASOS PCT: 0.5 % (ref 0.0–3.0)
Basophils Absolute: 0 10*3/uL (ref 0.0–0.1)
EOS ABS: 0.4 10*3/uL (ref 0.0–0.7)
EOS PCT: 5.3 % — AB (ref 0.0–5.0)
HEMATOCRIT: 42.8 % (ref 36.0–46.0)
HEMOGLOBIN: 14.4 g/dL (ref 12.0–15.0)
LYMPHS PCT: 21.6 % (ref 12.0–46.0)
Lymphs Abs: 1.8 10*3/uL (ref 0.7–4.0)
MCHC: 33.8 g/dL (ref 30.0–36.0)
MCV: 91.6 fl (ref 78.0–100.0)
Monocytes Absolute: 0.5 10*3/uL (ref 0.1–1.0)
Monocytes Relative: 5.7 % (ref 3.0–12.0)
NEUTROS ABS: 5.6 10*3/uL (ref 1.4–7.7)
Neutrophils Relative %: 66.9 % (ref 43.0–77.0)
PLATELETS: 127 10*3/uL — AB (ref 150.0–400.0)
RBC: 4.67 Mil/uL (ref 3.87–5.11)
RDW: 13 % (ref 11.5–15.5)
WBC: 8.4 10*3/uL (ref 4.0–10.5)

## 2016-07-06 LAB — LIPID PANEL
CHOL/HDL RATIO: 3
Cholesterol: 162 mg/dL (ref 0–200)
HDL: 47.1 mg/dL (ref >39.00)
LDL CALC: 99 mg/dL (ref 0–99)
NONHDL: 114.4
TRIGLYCERIDES: 79 mg/dL (ref 0.0–149.0)
VLDL: 15.8 mg/dL (ref 0.0–40.0)

## 2016-07-06 LAB — COMPREHENSIVE METABOLIC PANEL
ALT: 49 U/L — ABNORMAL HIGH (ref 0–35)
AST: 57 U/L — AB (ref 0–37)
Albumin: 4.4 g/dL (ref 3.5–5.2)
Alkaline Phosphatase: 99 U/L (ref 39–117)
BUN: 41 mg/dL — AB (ref 6–23)
CALCIUM: 9.6 mg/dL (ref 8.4–10.5)
CHLORIDE: 105 meq/L (ref 96–112)
CO2: 25 meq/L (ref 19–32)
CREATININE: 1.58 mg/dL — AB (ref 0.40–1.20)
GFR: 42.82 mL/min — ABNORMAL LOW (ref >60.00)
Glucose, Bld: 117 mg/dL — ABNORMAL HIGH (ref 70–99)
POTASSIUM: 4.7 meq/L (ref 3.5–5.1)
SODIUM: 139 meq/L (ref 135–145)
Total Bilirubin: 0.6 mg/dL (ref 0.2–1.2)
Total Protein: 8 g/dL (ref 6.0–8.3)

## 2016-07-06 LAB — VITAMIN B12: Vitamin B-12: 333 pg/mL (ref 211–911)

## 2016-07-06 LAB — TSH: TSH: 0.98 u[IU]/mL (ref 0.35–4.50)

## 2016-07-06 LAB — VITAMIN D 25 HYDROXY (VIT D DEFICIENCY, FRACTURES): VITD: 32.22 ng/mL (ref 30.00–100.00)

## 2016-07-07 LAB — HEPATITIS C ANTIBODY: HCV Ab: NEGATIVE

## 2016-07-16 ENCOUNTER — Other Ambulatory Visit: Payer: Self-pay | Admitting: Internal Medicine

## 2016-07-16 ENCOUNTER — Encounter: Payer: Self-pay | Admitting: Internal Medicine

## 2016-07-16 DIAGNOSIS — K760 Fatty (change of) liver, not elsewhere classified: Secondary | ICD-10-CM | POA: Insufficient documentation

## 2016-07-16 DIAGNOSIS — N289 Disorder of kidney and ureter, unspecified: Secondary | ICD-10-CM

## 2016-07-19 ENCOUNTER — Other Ambulatory Visit: Payer: Self-pay | Admitting: Internal Medicine

## 2016-07-25 ENCOUNTER — Other Ambulatory Visit: Payer: Self-pay | Admitting: Internal Medicine

## 2016-07-26 ENCOUNTER — Other Ambulatory Visit: Payer: Self-pay | Admitting: Internal Medicine

## 2016-07-30 ENCOUNTER — Other Ambulatory Visit: Payer: Self-pay | Admitting: Internal Medicine

## 2016-07-30 DIAGNOSIS — I10 Essential (primary) hypertension: Secondary | ICD-10-CM

## 2016-08-17 ENCOUNTER — Other Ambulatory Visit: Payer: Self-pay | Admitting: Emergency Medicine

## 2016-08-17 ENCOUNTER — Other Ambulatory Visit (INDEPENDENT_AMBULATORY_CARE_PROVIDER_SITE_OTHER): Payer: 59

## 2016-08-17 DIAGNOSIS — D72829 Elevated white blood cell count, unspecified: Secondary | ICD-10-CM | POA: Diagnosis not present

## 2016-08-17 DIAGNOSIS — N289 Disorder of kidney and ureter, unspecified: Secondary | ICD-10-CM

## 2016-08-17 DIAGNOSIS — L659 Nonscarring hair loss, unspecified: Secondary | ICD-10-CM | POA: Diagnosis not present

## 2016-08-17 LAB — COMPREHENSIVE METABOLIC PANEL
ALT: 46 U/L — AB (ref 0–35)
AST: 43 U/L — AB (ref 0–37)
Albumin: 4.4 g/dL (ref 3.5–5.2)
Alkaline Phosphatase: 105 U/L (ref 39–117)
BILIRUBIN TOTAL: 0.7 mg/dL (ref 0.2–1.2)
BUN: 31 mg/dL — ABNORMAL HIGH (ref 6–23)
CHLORIDE: 105 meq/L (ref 96–112)
CO2: 23 meq/L (ref 19–32)
CREATININE: 1.25 mg/dL — AB (ref 0.40–1.20)
Calcium: 9.8 mg/dL (ref 8.4–10.5)
GFR: 56.1 mL/min — ABNORMAL LOW (ref >60.00)
GLUCOSE: 199 mg/dL — AB (ref 70–99)
Potassium: 4.3 mEq/L (ref 3.5–5.1)
SODIUM: 140 meq/L (ref 135–145)
Total Protein: 7.7 g/dL (ref 6.0–8.3)

## 2016-08-17 LAB — CBC WITH DIFFERENTIAL/PLATELET
Basophils Absolute: 0 10*3/uL (ref 0.0–0.1)
Basophils Relative: 0.5 % (ref 0.0–3.0)
EOS ABS: 0.3 10*3/uL (ref 0.0–0.7)
Eosinophils Relative: 3.6 % (ref 0.0–5.0)
HCT: 43.2 % (ref 36.0–46.0)
Hemoglobin: 14.3 g/dL (ref 12.0–15.0)
LYMPHS ABS: 2.5 10*3/uL (ref 0.7–4.0)
LYMPHS PCT: 28.7 % (ref 12.0–46.0)
MCHC: 33.1 g/dL (ref 30.0–36.0)
MCV: 92.9 fl (ref 78.0–100.0)
MONO ABS: 0.6 10*3/uL (ref 0.1–1.0)
Monocytes Relative: 6.9 % (ref 3.0–12.0)
NEUTROS ABS: 5.2 10*3/uL (ref 1.4–7.7)
NEUTROS PCT: 60.3 % (ref 43.0–77.0)
PLATELETS: 147 10*3/uL — AB (ref 150.0–400.0)
RBC: 4.65 Mil/uL (ref 3.87–5.11)
RDW: 12.4 % (ref 11.5–15.5)
WBC: 8.6 10*3/uL (ref 4.0–10.5)

## 2016-08-22 ENCOUNTER — Encounter: Payer: Self-pay | Admitting: Internal Medicine

## 2016-08-30 ENCOUNTER — Other Ambulatory Visit: Payer: Self-pay | Admitting: Internal Medicine

## 2016-09-07 ENCOUNTER — Ambulatory Visit: Payer: 59 | Admitting: Internal Medicine

## 2016-10-30 ENCOUNTER — Ambulatory Visit: Payer: 59 | Admitting: Internal Medicine

## 2016-11-16 ENCOUNTER — Other Ambulatory Visit: Payer: Self-pay | Admitting: Obstetrics and Gynecology

## 2016-11-16 DIAGNOSIS — Z124 Encounter for screening for malignant neoplasm of cervix: Secondary | ICD-10-CM | POA: Diagnosis not present

## 2016-11-16 DIAGNOSIS — Z01419 Encounter for gynecological examination (general) (routine) without abnormal findings: Secondary | ICD-10-CM | POA: Diagnosis not present

## 2016-11-16 DIAGNOSIS — Z1231 Encounter for screening mammogram for malignant neoplasm of breast: Secondary | ICD-10-CM | POA: Diagnosis not present

## 2016-11-16 LAB — HM PAP SMEAR

## 2016-11-20 LAB — CYTOLOGY - PAP

## 2016-11-27 ENCOUNTER — Other Ambulatory Visit: Payer: Self-pay | Admitting: Internal Medicine

## 2017-01-22 ENCOUNTER — Other Ambulatory Visit: Payer: Self-pay | Admitting: Internal Medicine

## 2017-02-06 ENCOUNTER — Ambulatory Visit: Payer: 59 | Admitting: Internal Medicine

## 2017-02-28 ENCOUNTER — Other Ambulatory Visit: Payer: Self-pay | Admitting: Internal Medicine

## 2017-03-26 ENCOUNTER — Other Ambulatory Visit: Payer: Self-pay | Admitting: Internal Medicine

## 2017-03-29 ENCOUNTER — Other Ambulatory Visit: Payer: Self-pay | Admitting: Internal Medicine

## 2017-04-11 ENCOUNTER — Ambulatory Visit (INDEPENDENT_AMBULATORY_CARE_PROVIDER_SITE_OTHER): Payer: 59 | Admitting: Internal Medicine

## 2017-04-11 ENCOUNTER — Encounter: Payer: Self-pay | Admitting: Internal Medicine

## 2017-04-11 VITALS — BP 134/82 | HR 85 | Wt 209.0 lb

## 2017-04-11 DIAGNOSIS — E1151 Type 2 diabetes mellitus with diabetic peripheral angiopathy without gangrene: Secondary | ICD-10-CM | POA: Diagnosis not present

## 2017-04-11 DIAGNOSIS — E7800 Pure hypercholesterolemia, unspecified: Secondary | ICD-10-CM | POA: Diagnosis not present

## 2017-04-11 DIAGNOSIS — Z23 Encounter for immunization: Secondary | ICD-10-CM | POA: Diagnosis not present

## 2017-04-11 LAB — POCT GLYCOSYLATED HEMOGLOBIN (HGB A1C): Hemoglobin A1C: 5.9

## 2017-04-11 MED ORDER — DAPAGLIFLOZIN PROPANEDIOL 10 MG PO TABS: 10.0000 mg | ORAL_TABLET | Freq: Every day | ORAL | 3 refills | Status: DC

## 2017-04-11 NOTE — Patient Instructions (Addendum)
Please continue: - Metformin 2000 mg with dinner. - Trulicity 1.5 mg weekly - Farxiga 10 mg daily in am - Glipizide 5 mg before dinner.  Please return in 4 months with your sugar log

## 2017-04-11 NOTE — Addendum Note (Signed)
Addended by: Caprice Beaver T on: 04/11/2017 10:39 AM   Modules accepted: Orders

## 2017-04-11 NOTE — Progress Notes (Signed)
Patient ID: [name], female   DOB: 15-Jan-1956, 61 y.o.   MRN: [id number]  HPI: [name] is a 61 y.o.-year-old female, returning for follow-up for DM2, dx in 2000, non-insulin-dependent, uncontrolled, with complications (PVD, CKD). Last visit 10 mo ago!  She lost 13 lbs since last visit by improving her diet eliminating processed foods and reducing sweets.   Last hemoglobin A1c was: Lab Results  Component Value Date   HGBA1C 8.1 06/06/2016   HGBA1C 8.1 03/07/2016   HGBA1C 7.4 10/13/2015   Pt is on a regimen of: - Metformin 2000 mg with dinner - Trulicity 1.5 mg weekly - Farxiga 10 mg daily in am  - Glipizide 5 mg before dinner >> added 02/2016  She was on Victoza up to 1 year ago when insurance did not cover it. She was on Kombiglyze = Metformin + Onglyza (Saxagliptin) - 1000-2.5 mg daily >> rash  Pt checks her sugars 0-1x a day: - am:111-usually 140-150s, 203 >> 117, 120, 140-160 >> 73, 100-120 - 2h after b'fast: n/c>> 221 >> n/c - before lunch: n/c >> 138 >> n/c - 2h after lunch: 160-180s, 210 >> 160-177 >> n/c - before dinner: n/c >> 208 >> n/c - 2h after dinner: n/c - bedtime: n/c - nighttime: n/c Lowest sugar was low 70s x2 ; she has hypoglycemia awareness at 80s. Highest sugar was 240 x1 >> 130.  Glucometer: One Touch Ultra  Pt's meals are: - Breakfast: yoghurt, hard boiled egg >> cereal (raisin bran) or whole wheat toast or egg or banana - Lunch: soup, sandwich >> chicken + salad, veggies (cafeteria) - Dinner: varies - no red meat - late: 8-9 pm  - Snacks: 2- cheese and crackers, yoghurt Exercise: prev. cardio 3x a week >> not anymore >> but started walking with her dog and plans to restart going to the [facility]  - + CKD, last BUN/creatinine:  Lab Results  Component Value Date   BUN 31 (H) 08/17/2016   CREATININE 1.25 (H) 08/17/2016   Lab Results  Component Value Date   GFRAA 85 04/22/2008   GFRAA 85 11/22/2006   Lab Results  Component Value Date    MICRALBCREAT 0.7 06/07/2015   MICRALBCREAT 0.8 02/03/2015   MICRALBCREAT 0.5 06/05/2014   MICRALBCREAT 0.4 04/05/2012   MICRALBCREAT 0.6 11/01/2010   MICRALBCREAT 0.4 02/14/2010   MICRALBCREAT 5.1 04/22/2008   MICRALBCREAT 3.0 11/22/2006  On Benazepril.  - She has a h/o HL. Last set of lipids: Lab Results  Component Value Date   CHOL 162 07/06/2016   HDL 47.10 07/06/2016   LDLCALC 99 07/06/2016   LDLDIRECT 149.9 02/14/2010   TRIG 79.0 07/06/2016   CHOLHDL 3 07/06/2016  On Lipitor 40, on Ezetimibe 10. - last eye exam was in 05/2015 >> no DR - she has numbness and tingling in her feet after her back sx  She also has HTN, HL. Had back surgery 2007. Back pain increased 02/2015 >> saw Dr Tamala Julian >> started Gabapentin >> disequilibrium. She was on steroids then >> sugars higher. In 03/2015: spine steroid inj.  ROS: Constitutional: + weight loss, no fatigue, no subjective hyperthermia, no subjective hypothermia Eyes: no blurry vision, no xerophthalmia ENT: no sore throat, no nodules palpated in throat, no dysphagia, no odynophagia, no hoarseness Cardiovascular: no CP/no SOB/no palpitations/no leg swelling Respiratory: no cough/no SOB/no wheezing Gastrointestinal: no N/no V/no D/no C/no acid reflux Musculoskeletal: no muscle aches/no joint aches Skin: no rashes, no hair loss Neurological: no tremors/+ numbness/+  tingling/no dizziness  I reviewed pt's medications, allergies, PMH, social hx, family hx, and changes were documented in the history of present illness. Otherwise, unchanged from my initial visit note.   Past Medical History:  Diagnosis Date  . Colitis    in her 75s; quiescent since  . DM (diabetes mellitus) ([place])   . HTN (hypertension)   . Hyperlipidemia    Past Surgical History:  Procedure Laterality Date  . CERVIX SURGERY  2001   Cryosurgery, Dr Connye Burkitt  . COLONOSCOPY  2009   Negative, Dr. Delfin Edis  . FOOT SURGERY     bone spurs resected, Dr Aida Puffer  .  KNEE ARTHROSCOPY  1980   Right  . LAMINECTOMY  2007   X 2, Dr Gladstone Lighter   Social History   Social History  . Marital Status: Divorced    Spouse Name: N/A  . Number of Children: 1   Occupational History  . 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 Smoking status: Former Smoker    Quit date: 1996  . Alcohol Use: Yes     Comment:  rarely  . Drug Use: No   Current Outpatient Prescriptions on File Prior to Visit  Medication Sig Dispense Refill  . aspirin 81 MG tablet Take 81 mg by mouth daily.      Marland Kitchen atorvastatin (LIPITOR) 40 MG tablet Take 1 tablet (40 mg total) by mouth daily at 6 PM. 90 tablet 3  . benazepril (LOTENSIN) 40 MG tablet TAKE 1 TABLET (40 MG TOTAL) BY MOUTH DAILY. 90 tablet 5  . estradiol (ESTRACE) 0.1 MG/GM vaginal cream Place 1 Applicatorful vaginally at bedtime. Once weekly for 1 month, then .5 ml monthly.    . ezetimibe (ZETIA) 10 MG tablet TAKE 1 TABLET (10 MG TOTAL) BY MOUTH DAILY. 90 tablet 3  . FARXIGA 10 MG TABS tablet TAKE ONE TABLET BY MOUTH DAILY 30 tablet 1  . glipiZIDE (GLUCOTROL) 5 MG tablet TAKE ONE TABLET BY MOUTH 15-30 MINUTES BEFORE DINNER 30 tablet 2  . glucose blood (ONETOUCH VERIO) test strip Use to check blood sugar 1 time per day. Dx Code E11.9 100 each 12  . hydrochlorothiazide (HYDRODIURIL) 25 MG tablet TAKE 1/2 TABLET BY MOUTH DAILY 15 tablet 5  . metFORMIN (GLUCOPHAGE) 1000 MG tablet TAKE ONE TABLET BY MOUTH TWO TIMES A DAY 60 tablet 2  . metoprolol (LOPRESSOR) 50 MG tablet TAKE 1/2 TABLET BY MOUTH TWO TIMES A DAY 30 tablet 5  . TRULICITY 1.5 GN/5.6OZ SOPN INJECT 1.5 MG UNDER THE SKIN ONCE WEEKLY 2 pen 3  . [DISCONTINUED] Saxagliptin-Metformin (KOMBIGLYZE XR) 2.11-998 MG TB24 2 daily 30 tablet 2   No current facility-administered medications on file prior to visit.    Allergies  Allergen Reactions  . Atorvastatin     REACTION: ELEVATES LFT'S. Pt states she is currently taking Atorvastatin 40 mg (1/2 tablet daily)  and does not recall hab=ving any issues with it. (07/14/14)  . Codeine Nausea Only       . Neomycin-Bacitracin Zn-Polymyx Rash  . Onglyza [Saxagliptin Hydrochloride] Rash    This occurred after starting Kombiglyze . This was present as a rash over the hands and feet dorsally. She has been able to take metformin in the past as well as Janumet without problems.  . Sulfamethoxazole-Trimethoprim Nausea And Vomiting     Nausea was extreme.     Family History  Problem Relation Age of Onset  . Diabetes Father   .  Hypertension Father   . Heart attack Father 20       precipitated by bleeding ulcer  . Diabetes Mother   . Hypertension Mother   . Coronary artery disease Mother   . Heart attack Paternal Grandfather        ? 72  . Heart attack Maternal Grandfather 68  . Diabetes Maternal Grandmother   . Stroke Maternal Grandmother 82  . Cancer Neg Hx    PE: BP 134/82 (BP Location: Left Arm, Patient Position: Sitting)   Pulse 85   Wt 209 lb (94.8 kg)   SpO2 96%   BMI 30.86 kg/m  Body mass index is 30.86 kg/m.  Wt Readings from Last 3 Encounters:  04/11/17 209 lb (94.8 kg)  07/03/16 222 lb (100.7 kg)  06/06/16 224 lb (101.6 kg)   Constitutional: overweight, in NAD Eyes: PERRLA, EOMI, no exophthalmos ENT: moist mucous membranes, no thyromegaly, no cervical lymphadenopathy Cardiovascular: RRR, No MRG Respiratory: CTA B Gastrointestinal: abdomen soft, NT, ND, BS+ Musculoskeletal: no deformities, strength intact in all 4 Skin: moist, warm, no rashes Neurological: no tremor with outstretched hands, DTR normal in all 4  ASSESSMENT: 1. DM2, non-insulin-dependent, uncontrolled, with complications - PVD - CKD  2. HL  PLAN:  1. Patient with long-standing, Previously uncontrolled diabetes, now with greatly improved control after she started to watch her diet and to be compliant with her diabetic medication. He continues on metformin, glipizide, GLP-1 receptor agonist and SGLT2  inhibitor. She has lost 13 pounds since last visit and I congratulated her for this. - today, HbA1c is 5.9% (GREAT!) - No changes are necessary in her regimen for now, however, at next visit, if HbA1c and sugars are still as good as now, we will start de-escalating her regimen. I advised her to also start checking some sugars later in the day. - I suggested to:  Patient Instructions  Please continue: - Metformin 2000 mg with dinner. - Trulicity 1.5 mg weekly - Farxiga 10 mg daily in am - Glipizide 5 mg before dinner.  Please return in 3 months with your sugar log  - continue checking sugars at different times of the day - check 1x a day, rotating checks - give her a One Touch ultra 2 glucometer - advised for yearly eye exams >> she is due - will give her the flu shot today - Return to clinic in 3 mo with sugar log   2. HL - great levels at last check, large decrease in LDL-C - continues statin and PCP also added Ezetimibe - she has some joint pain from the statin >> has appt coming up with PCP >> would like to discuss possibly stopping statin  Philemon Kingdom, MD PhD [facility] Endocrinology

## 2017-04-28 LAB — HM DIABETES EYE EXAM

## 2017-06-16 ENCOUNTER — Other Ambulatory Visit: Payer: Self-pay | Admitting: Internal Medicine

## 2017-06-22 ENCOUNTER — Other Ambulatory Visit: Payer: Self-pay | Admitting: Internal Medicine

## 2017-07-03 ENCOUNTER — Encounter: Payer: Self-pay | Admitting: Internal Medicine

## 2017-07-03 NOTE — Progress Notes (Signed)
Subjective:    Patient ID: [name], female    DOB: April 02, 1956, 61 y.o.   MRN: [id number]  HPI She is here for a physical exam.   She feels well and has no concerns.  Overall, she feels well.  She has been eating well and eating less and has lost some weight.    She still has difficulty staying asleep, but is able to fall asleep without problems.  She has tried melatonin, byt it has not helped.  She functions ok and does not want further medication.   Medications and allergies reviewed with patient and updated if appropriate.  Patient Active Problem List   Diagnosis Date Noted  . Fatty liver 07/16/2016  . Hair thinning 07/03/2016  . Lumbar back pain with radiculopathy affecting left lower extremity 02/22/2015  . PVC's (premature ventricular contractions) 06/09/2014  . Essential hypertension 02/14/2010  . Type 2 diabetes mellitus with peripheral vascular disease ([place]) 11/21/2006  . Hyperlipidemia 11/21/2006    Current Outpatient Medications on File Prior to Visit  Medication Sig Dispense Refill  . aspirin 81 MG tablet Take 81 mg by mouth daily.      Marland Kitchen atorvastatin (LIPITOR) 40 MG tablet Take 1 tablet (40 mg total) by mouth daily at 6 PM. 90 tablet 3  . benazepril (LOTENSIN) 40 MG tablet TAKE 1 TABLET (40 MG TOTAL) BY MOUTH DAILY. 90 tablet 5  . dapagliflozin propanediol (FARXIGA) 10 MG TABS tablet Take 10 mg by mouth daily. 90 tablet 3  . estradiol (ESTRACE) 0.1 MG/GM vaginal cream Place 1 Applicatorful vaginally at bedtime. Once weekly for 1 month, then .5 ml monthly.    . ezetimibe (ZETIA) 10 MG tablet TAKE 1 TABLET (10 MG TOTAL) BY MOUTH DAILY. 90 tablet 3  . glipiZIDE (GLUCOTROL) 5 MG tablet TAKE ONE TABLET BY MOUTH 15-30 MINUTES BEFORE DINNER 30 tablet 2  . glucose blood (ONETOUCH VERIO) test strip Use to check blood sugar 1 time per day. Dx Code E11.9 100 each 12  . hydrochlorothiazide (HYDRODIURIL) 25 MG tablet TAKE 1/2 TABLET BY MOUTH DAILY 15 tablet 5  . metFORMIN  (GLUCOPHAGE) 1000 MG tablet Take 1 tablet (1,000 mg total) by mouth 2 (two) times daily. 60 tablet 0  . metoprolol (LOPRESSOR) 50 MG tablet TAKE 1/2 TABLET BY MOUTH TWO TIMES A DAY 30 tablet 5  . TRULICITY 1.5 XJ/8.8TG SOPN INJECT 1.5 MG UNDER THE SKIN ONCE WEEKLY 2 pen 3  . [DISCONTINUED] Saxagliptin-Metformin (KOMBIGLYZE XR) 2.11-998 MG TB24 2 daily 30 tablet 2   No current facility-administered medications on file prior to visit.     Past Medical History:  Diagnosis Date  . Colitis    in her 38s; quiescent since  . DM (diabetes mellitus) ([place])   . HTN (hypertension)   . Hyperlipidemia     Past Surgical History:  Procedure Laterality Date  . CERVIX SURGERY  2001   Cryosurgery, Dr Connye Burkitt  . COLONOSCOPY  2009   Negative, Dr. Delfin Edis  . FOOT SURGERY     bone spurs resected, Dr Aida Puffer  . KNEE ARTHROSCOPY  1980   Right  . LAMINECTOMY  2007   X 2, Dr Gladstone Lighter    Social History   Socioeconomic History  . Marital status: Divorced    Spouse name: None  . Number of children: None  . Years of education: None  . Highest education level: None  Social Needs  . Financial resource strain: None  . Food insecurity -  worry: None  . Food insecurity - inability: None  . Transportation needs - medical: None  . Transportation needs - non-medical: None  Occupational History  . None  Tobacco Use  . Smoking status: Former Smoker    Last attempt to quit: 07/17/1977    Years since quitting: 39.9  . Smokeless tobacco: Never Used  . Tobacco comment: Smoked in [place] ; [phone], up to 1/2 ppd  Substance and Sexual Activity  . Alcohol use: Yes    Comment:  rarely  . Drug use: No  . Sexual activity: None  Other Topics Concern  . None  Social History Narrative  . None    Family History  Problem Relation Age of Onset  . Diabetes Father   . Hypertension Father   . Heart attack Father 56       precipitated by bleeding ulcer  . Diabetes Mother   . Hypertension Mother   .  Coronary artery disease Mother   . Heart attack Paternal Grandfather        ? 34  . Heart attack Maternal Grandfather 68  . Diabetes Maternal Grandmother   . Stroke Maternal Grandmother 82  . Cancer Neg Hx     Review of Systems  Constitutional: Negative for chills and fever.  Eyes: Negative for visual disturbance.  Respiratory: Negative for cough, shortness of breath and wheezing.   Cardiovascular: Positive for palpitations (occ). Negative for chest pain and leg swelling.  Gastrointestinal: Negative for abdominal pain, blood in stool, constipation, diarrhea and nausea.       Periodic upper epigastric spasm like feeling - not often  Genitourinary: Negative for dysuria and hematuria.  Musculoskeletal: Positive for arthralgias and back pain (chronic).  Skin: Negative for color change and rash.  Neurological: Negative for light-headedness and headaches.  Psychiatric/Behavioral: Negative for dysphoric mood. The patient is not nervous/anxious.        Objective:   Vitals:   07/04/17 0811  BP: 130/68  Pulse: 80  Resp: 16  Temp: 97.9 F (36.6 C)  SpO2: 97%   Filed Weights   07/04/17 0811  Weight: 207 lb (93.9 kg)   Body mass index is 30.57 kg/m.  Wt Readings from Last 3 Encounters:  07/04/17 207 lb (93.9 kg)  04/11/17 209 lb (94.8 kg)  07/03/16 222 lb (100.7 kg)     Physical Exam Constitutional: She appears well-developed and well-nourished. No distress.  HENT:  Head: Normocephalic and atraumatic.  Right Ear: External ear normal. Normal ear canal and TM Left Ear: External ear normal.  Normal ear canal and TM Mouth/Throat: Oropharynx is clear and moist.  Eyes: Conjunctivae and EOM are normal.  Neck: Neck supple. No tracheal deviation present. No thyromegaly present.  No carotid bruit  Cardiovascular: Normal rate, regular rhythm and normal heart sounds.   No murmur heard.  No edema. Pulmonary/Chest: Effort normal and breath sounds normal. No respiratory distress. She  has no wheezes. She has no rales.  Breast: deferred to Gyn Abdominal: Soft. She exhibits no distension. There is no tenderness.  Lymphadenopathy: She has no cervical adenopathy.  Skin: Skin is warm and dry. She is not diaphoretic.  Psychiatric: She has a normal mood and affect. Her behavior is normal.   Diabetic Foot Exam - Simple   Simple Foot Form Diabetic Foot exam was performed with the following findings:  Yes 07/04/2017  8:47 AM  Visual Inspection No deformities, no ulcerations, no other skin breakdown bilaterally:  Yes Sensation Testing Intact to  touch and monofilament testing bilaterally:  Yes Pulse Check Posterior Tibialis and Dorsalis pulse intact bilaterally:  Yes Comments         Assessment & Plan:   Physical exam: Screening blood work  ordered Immunizations   Discussed shingrix, others up to date Colonoscopy  Up to date  Mammogram   Up to date  Gyn    Up to date  Eye exams    Up to date  EKG   Last done 2015 Exercise - walks a lot at work, no exercise outside of work [employer]  - working on weight loss Skin  No concerns Substance abuse   none  See Problem List for Assessment and Plan of chronic medical problems.   FU in one year

## 2017-07-03 NOTE — Patient Instructions (Addendum)
Test(s) ordered today. Your results will be released to [place] (or called to you) after review, usually within 72hours after test completion. If any changes need to be made, you will be notified at that same time.  All other Health Maintenance issues reviewed.   All recommended immunizations and age-appropriate screenings are up-to-date or discussed.  No immunizations administered today.   Medications reviewed and updated.  No changes recommended at this time.   Please followup in one year for a physical   Health Maintenance, Female Adopting a healthy lifestyle and getting preventive care can go a long way to promote health and wellness. Talk with your health care provider about what schedule of regular examinations is right for you. This is a good chance for you to check in with your provider about disease prevention and staying healthy. In between checkups, there are plenty of things you can do on your own. Experts have done a lot of research about which lifestyle changes and preventive measures are most likely to keep you healthy. Ask your health care provider for more information. Weight and diet Eat a healthy diet  Be sure to include plenty of vegetables, fruits, low-fat dairy products, and lean protein.  Do not eat a lot of foods high in solid fats, added sugars, or salt.  Get regular exercise. This is one of the most important things you can do for your health. ? Most adults should exercise for at least 150 minutes each week. The exercise should increase your heart rate and make you sweat (moderate-intensity exercise). ? Most adults should also do strengthening exercises at least twice a week. This is in addition to the moderate-intensity exercise.  Maintain a healthy weight  Body mass index (BMI) is a measurement that can be used to identify possible weight problems. It estimates body fat based on height and weight. Your health care provider can help determine your BMI and help  you achieve or maintain a healthy weight.  For females 72 years of age and older: ? A BMI below 18.5 is considered underweight. ? A BMI of 18.5 to 24.9 is normal. ? A BMI of 25 to 29.9 is considered overweight. ? A BMI of 30 and above is considered obese.  Watch levels of cholesterol and blood lipids  You should start having your blood tested for lipids and cholesterol at 61 years of age, then have this test every 5 years.  You may need to have your cholesterol levels checked more often if: ? Your lipid or cholesterol levels are high. ? You are older than 61 years of age. ? You are at high risk for heart disease.  Cancer screening Lung Cancer  Lung cancer screening is recommended for adults 72-27 years old who are at high risk for lung cancer because of a history of smoking.  A yearly low-dose CT scan of the lungs is recommended for people who: ? Currently smoke. ? Have quit within the past 15 years. ? Have at least a 30-pack-year history of smoking. A pack year is smoking an average of one pack of cigarettes a day for 1 year.  Yearly screening should continue until it has been 15 years since you quit.  Yearly screening should stop if you develop a health problem that would prevent you from having lung cancer treatment.  Breast Cancer  Practice breast self-awareness. This means understanding how your breasts normally appear and feel.  It also means doing regular breast self-exams. Let your health care provider know  about any changes, no matter how small.  If you are in your 20s or 30s, you should have a clinical breast exam (CBE) by a health care provider every 1-3 years as part of a regular health exam.  If you are 7 or older, have a CBE every year. Also consider having a breast X-ray (mammogram) every year.  If you have a family history of breast cancer, talk to your health care provider about genetic screening.  If you are at high risk for breast cancer, talk to your  health care provider about having an MRI and a mammogram every year.  Breast cancer gene (BRCA) assessment is recommended for women who have family members with BRCA-related cancers. BRCA-related cancers include: ? Breast. ? Ovarian. ? Tubal. ? Peritoneal cancers.  Results of the assessment will determine the need for genetic counseling and BRCA1 and BRCA2 testing.  Cervical Cancer Your health care provider may recommend that you be screened regularly for cancer of the pelvic organs (ovaries, uterus, and vagina). This screening involves a pelvic examination, including checking for microscopic changes to the surface of your cervix (Pap test). You may be encouraged to have this screening done every 3 years, beginning at age 18.  For women ages 50-65, health care providers may recommend pelvic exams and Pap testing every 3 years, or they may recommend the Pap and pelvic exam, combined with testing for human papilloma virus (HPV), every 5 years. Some types of HPV increase your risk of cervical cancer. Testing for HPV may also be done on women of any age with unclear Pap test results.  Other health care providers may not recommend any screening for nonpregnant women who are considered low risk for pelvic cancer and who do not have symptoms. Ask your health care provider if a screening pelvic exam is right for you.  If you have had past treatment for cervical cancer or a condition that could lead to cancer, you need Pap tests and screening for cancer for at least 20 years after your treatment. If Pap tests have been discontinued, your risk factors (such as having a new sexual partner) need to be reassessed to determine if screening should resume. Some women have medical problems that increase the chance of getting cervical cancer. In these cases, your health care provider may recommend more frequent screening and Pap tests.  Colorectal Cancer  This type of cancer can be detected and often  prevented.  Routine colorectal cancer screening usually begins at 61 years of age and continues through 61 years of age.  Your health care provider may recommend screening at an earlier age if you have risk factors for colon cancer.  Your health care provider may also recommend using home test kits to check for hidden blood in the stool.  A small camera at the end of a tube can be used to examine your colon directly (sigmoidoscopy or colonoscopy). This is done to check for the earliest forms of colorectal cancer.  Routine screening usually begins at age 22.  Direct examination of the colon should be repeated every 5-10 years through 61 years of age. However, you may need to be screened more often if early forms of precancerous polyps or small growths are found.  Skin Cancer  Check your skin from head to toe regularly.  Tell your health care provider about any new moles or changes in moles, especially if there is a change in a mole's shape or color.  Also tell your health care  provider if you have a mole that is larger than the size of a pencil eraser.  Always use sunscreen. Apply sunscreen liberally and repeatedly throughout the day.  Protect yourself by wearing long sleeves, pants, a wide-brimmed hat, and sunglasses whenever you are outside.  Heart disease, diabetes, and high blood pressure  High blood pressure causes heart disease and increases the risk of stroke. High blood pressure is more likely to develop in: ? People who have blood pressure in the high end of the normal range (130-139/85-89 mm Hg). ? People who are overweight or obese. ? People who are African American.  If you are 18-39 years of age, have your blood pressure checked every 3-5 years. If you are 40 years of age or older, have your blood pressure checked every year. You should have your blood pressure measured twice-once when you are at a hospital or clinic, and once when you are not at a hospital or clinic.  Record the average of the two measurements. To check your blood pressure when you are not at a hospital or clinic, you can use: ? An automated blood pressure machine at a pharmacy. ? A home blood pressure monitor.  If you are between 55 years and 79 years old, ask your health care provider if you should take aspirin to prevent strokes.  Have regular diabetes screenings. This involves taking a blood sample to check your fasting blood sugar level. ? If you are at a normal weight and have a low risk for diabetes, have this test once every three years after 61 years of age. ? If you are overweight and have a high risk for diabetes, consider being tested at a younger age or more often. Preventing infection Hepatitis B  If you have a higher risk for hepatitis B, you should be screened for this virus. You are considered at high risk for hepatitis B if: ? You were born in a country where hepatitis B is common. Ask your health care provider which countries are considered high risk. ? Your parents were born in a high-risk country, and you have not been immunized against hepatitis B (hepatitis B vaccine). ? You have HIV or AIDS. ? You use needles to inject street drugs. ? You live with someone who has hepatitis B. ? You have had sex with someone who has hepatitis B. ? You get hemodialysis treatment. ? You take certain medicines for conditions, including cancer, organ transplantation, and autoimmune conditions.  Hepatitis C  Blood testing is recommended for: ? Everyone born from 1945 through 1965. ? Anyone with known risk factors for hepatitis C.  Sexually transmitted infections (STIs)  You should be screened for sexually transmitted infections (STIs) including gonorrhea and chlamydia if: ? You are sexually active and are younger than 61 years of age. ? You are older than 61 years of age and your health care provider tells you that you are at risk for this type of infection. ? Your sexual  activity has changed since you were last screened and you are at an increased risk for chlamydia or gonorrhea. Ask your health care provider if you are at risk.  If you do not have HIV, but are at risk, it may be recommended that you take a prescription medicine daily to prevent HIV infection. This is called pre-exposure prophylaxis (PrEP). You are considered at risk if: ? You are sexually active and do not regularly use condoms or know the HIV status of your partner(s). ? You take   drugs by injection. ? You are sexually active with a partner who has HIV.  Talk with your health care provider about whether you are at high risk of being infected with HIV. If you choose to begin PrEP, you should first be tested for HIV. You should then be tested every 3 months for as long as you are taking PrEP. Pregnancy  If you are premenopausal and you may become pregnant, ask your health care provider about preconception counseling.  If you may become pregnant, take 400 to 800 micrograms (mcg) of folic acid every day.  If you want to prevent pregnancy, talk to your health care provider about birth control (contraception). Osteoporosis and menopause  Osteoporosis is a disease in which the bones lose minerals and strength with aging. This can result in serious bone fractures. Your risk for osteoporosis can be identified using a bone density scan.  If you are 65 years of age or older, or if you are at risk for osteoporosis and fractures, ask your health care provider if you should be screened.  Ask your health care provider whether you should take a calcium or vitamin D supplement to lower your risk for osteoporosis.  Menopause may have certain physical symptoms and risks.  Hormone replacement therapy may reduce some of these symptoms and risks. Talk to your health care provider about whether hormone replacement therapy is right for you. Follow these instructions at home:  Schedule regular health, dental,  and eye exams.  Stay current with your immunizations.  Do not use any tobacco products including cigarettes, chewing tobacco, or electronic cigarettes.  If you are pregnant, do not drink alcohol.  If you are breastfeeding, limit how much and how often you drink alcohol.  Limit alcohol intake to no more than 1 drink per day for nonpregnant women. One drink equals 12 ounces of beer, 5 ounces of wine, or 1 ounces of hard liquor.  Do not use street drugs.  Do not share needles.  Ask your health care provider for help if you need support or information about quitting drugs.  Tell your health care provider if you often feel depressed.  Tell your health care provider if you have ever been abused or do not feel safe at home. This information is not intended to replace advice given to you by your health care provider. Make sure you discuss any questions you have with your health care provider. Document Released: 01/16/2011 Document Revised: 12/09/2015 Document Reviewed: 04/06/2015 Elsevier Interactive Patient Education  2018 [employer] Inc[employer]    

## 2017-07-04 ENCOUNTER — Other Ambulatory Visit (INDEPENDENT_AMBULATORY_CARE_PROVIDER_SITE_OTHER): Payer: 59

## 2017-07-04 ENCOUNTER — Ambulatory Visit (INDEPENDENT_AMBULATORY_CARE_PROVIDER_SITE_OTHER): Payer: 59 | Admitting: Internal Medicine

## 2017-07-04 ENCOUNTER — Encounter: Payer: Self-pay | Admitting: Internal Medicine

## 2017-07-04 VITALS — BP 130/68 | HR 80 | Temp 97.9°F | Resp 16 | Ht 69.0 in | Wt 207.0 lb

## 2017-07-04 DIAGNOSIS — E7849 Other hyperlipidemia: Secondary | ICD-10-CM

## 2017-07-04 DIAGNOSIS — I10 Essential (primary) hypertension: Secondary | ICD-10-CM

## 2017-07-04 DIAGNOSIS — E1151 Type 2 diabetes mellitus with diabetic peripheral angiopathy without gangrene: Secondary | ICD-10-CM

## 2017-07-04 DIAGNOSIS — I493 Ventricular premature depolarization: Secondary | ICD-10-CM

## 2017-07-04 DIAGNOSIS — M5416 Radiculopathy, lumbar region: Secondary | ICD-10-CM | POA: Diagnosis not present

## 2017-07-04 DIAGNOSIS — Z0000 Encounter for general adult medical examination without abnormal findings: Secondary | ICD-10-CM | POA: Diagnosis not present

## 2017-07-04 LAB — CBC WITH DIFFERENTIAL/PLATELET
BASOS PCT: 0.8 % (ref 0.0–3.0)
Basophils Absolute: 0.1 10*3/uL (ref 0.0–0.1)
EOS PCT: 4 % (ref 0.0–5.0)
Eosinophils Absolute: 0.3 10*3/uL (ref 0.0–0.7)
HCT: 44.3 % (ref 36.0–46.0)
HEMOGLOBIN: 14.6 g/dL (ref 12.0–15.0)
Lymphocytes Relative: 22 % (ref 12.0–46.0)
Lymphs Abs: 1.9 10*3/uL (ref 0.7–4.0)
MCHC: 33 g/dL (ref 30.0–36.0)
MCV: 94.5 fl (ref 78.0–100.0)
MONO ABS: 0.5 10*3/uL (ref 0.1–1.0)
Monocytes Relative: 5.5 % (ref 3.0–12.0)
Neutro Abs: 5.8 10*3/uL (ref 1.4–7.7)
Neutrophils Relative %: 67.7 % (ref 43.0–77.0)
Platelets: 132 10*3/uL — ABNORMAL LOW (ref 150.0–400.0)
RBC: 4.68 Mil/uL (ref 3.87–5.11)
RDW: 12.9 % (ref 11.5–15.5)
WBC: 8.5 10*3/uL (ref 4.0–10.5)

## 2017-07-04 LAB — LIPID PANEL
CHOLESTEROL: 162 mg/dL (ref 0–200)
HDL: 56.5 mg/dL (ref >39.00)
LDL Cholesterol: 87 mg/dL (ref 0–99)
NonHDL: 105.79
TRIGLYCERIDES: 96 mg/dL (ref 0.0–149.0)
Total CHOL/HDL Ratio: 3
VLDL: 19.2 mg/dL (ref 0.0–40.0)

## 2017-07-04 LAB — COMPREHENSIVE METABOLIC PANEL
ALBUMIN: 4.5 g/dL (ref 3.5–5.2)
ALT: 30 U/L (ref 0–35)
AST: 35 U/L (ref 0–37)
Alkaline Phosphatase: 89 U/L (ref 39–117)
BUN: 26 mg/dL — AB (ref 6–23)
CHLORIDE: 105 meq/L (ref 96–112)
CO2: 26 meq/L (ref 19–32)
Calcium: 10 mg/dL (ref 8.4–10.5)
Creatinine, Ser: 1.17 mg/dL (ref 0.40–1.20)
GFR: 60.37 mL/min (ref >60.00)
GLUCOSE: 121 mg/dL — AB (ref 70–99)
POTASSIUM: 4.1 meq/L (ref 3.5–5.1)
SODIUM: 141 meq/L (ref 135–145)
Total Bilirubin: 0.8 mg/dL (ref 0.2–1.2)
Total Protein: 8.2 g/dL (ref 6.0–8.3)

## 2017-07-04 LAB — TSH: TSH: 0.73 u[IU]/mL (ref 0.35–4.50)

## 2017-07-04 MED ORDER — HYOSCYAMINE SULFATE 0.125 MG SL SUBL: 0.1250 mg | SUBLINGUAL_TABLET | SUBLINGUAL | 0 refills | Status: DC | PRN

## 2017-07-04 NOTE — Assessment & Plan Note (Signed)
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

## 2017-07-04 NOTE — Assessment & Plan Note (Addendum)
Has occasional palpitations - usually at night at rest Stable Not on any medication, except daily metoprolol at low dose

## 2017-07-04 NOTE — Assessment & Plan Note (Signed)
BP well controlled Current regimen effective and well tolerated Continue current medications at current doses cmp  

## 2017-07-04 NOTE — Assessment & Plan Note (Signed)
Well controlled Not currently taking farxiga Has follow up with Dr Cruzita Lederer next month Continue healthy diet Very active - walks a lot during the day, but no aerobic exercise

## 2017-07-04 NOTE — Assessment & Plan Note (Signed)
Check lipid panel  Continue daily statin, zetia Regular exercise and healthy diet encouraged

## 2017-07-08 ENCOUNTER — Encounter: Payer: Self-pay | Admitting: Internal Medicine

## 2017-07-08 ENCOUNTER — Other Ambulatory Visit: Payer: Self-pay | Admitting: Internal Medicine

## 2017-07-20 ENCOUNTER — Other Ambulatory Visit: Payer: Self-pay | Admitting: Internal Medicine

## 2017-08-09 DIAGNOSIS — M21962 Unspecified acquired deformity of left lower leg: Secondary | ICD-10-CM | POA: Diagnosis not present

## 2017-08-09 DIAGNOSIS — E139 Other specified diabetes mellitus without complications: Secondary | ICD-10-CM | POA: Diagnosis not present

## 2017-08-09 DIAGNOSIS — M2042 Other hammer toe(s) (acquired), left foot: Secondary | ICD-10-CM | POA: Diagnosis not present

## 2017-08-09 DIAGNOSIS — M21961 Unspecified acquired deformity of right lower leg: Secondary | ICD-10-CM | POA: Diagnosis not present

## 2017-08-10 ENCOUNTER — Ambulatory Visit: Payer: 59 | Admitting: Internal Medicine

## 2017-08-19 ENCOUNTER — Other Ambulatory Visit: Payer: Self-pay | Admitting: Internal Medicine

## 2017-08-19 DIAGNOSIS — I10 Essential (primary) hypertension: Secondary | ICD-10-CM

## 2017-08-21 ENCOUNTER — Other Ambulatory Visit: Payer: Self-pay | Admitting: Internal Medicine

## 2017-09-12 DIAGNOSIS — M2042 Other hammer toe(s) (acquired), left foot: Secondary | ICD-10-CM | POA: Diagnosis not present

## 2017-09-12 DIAGNOSIS — M24575 Contracture, left foot: Secondary | ICD-10-CM | POA: Diagnosis not present

## 2017-09-12 DIAGNOSIS — E139 Other specified diabetes mellitus without complications: Secondary | ICD-10-CM | POA: Diagnosis not present

## 2017-09-16 ENCOUNTER — Other Ambulatory Visit: Payer: Self-pay | Admitting: Internal Medicine

## 2017-09-17 DIAGNOSIS — G8929 Other chronic pain: Secondary | ICD-10-CM | POA: Diagnosis not present

## 2017-09-17 DIAGNOSIS — Z0389 Encounter for observation for other suspected diseases and conditions ruled out: Secondary | ICD-10-CM | POA: Diagnosis not present

## 2017-09-17 DIAGNOSIS — Z01818 Encounter for other preprocedural examination: Secondary | ICD-10-CM | POA: Diagnosis not present

## 2017-09-24 DIAGNOSIS — M24575 Contracture, left foot: Secondary | ICD-10-CM | POA: Diagnosis not present

## 2017-09-24 DIAGNOSIS — M2042 Other hammer toe(s) (acquired), left foot: Secondary | ICD-10-CM | POA: Diagnosis not present

## 2017-09-24 MED FILL — HYDROCODON-APAP 5-325: 5-325 | 5 days supply | Qty: 24 | Fill #0

## 2017-09-28 DIAGNOSIS — M2042 Other hammer toe(s) (acquired), left foot: Secondary | ICD-10-CM | POA: Diagnosis not present

## 2017-09-30 ENCOUNTER — Encounter: Payer: Self-pay | Admitting: Gastroenterology

## 2017-10-06 ENCOUNTER — Other Ambulatory Visit: Payer: Self-pay | Admitting: Internal Medicine

## 2017-10-10 ENCOUNTER — Ambulatory Visit: Payer: 59 | Admitting: Internal Medicine

## 2017-11-07 DIAGNOSIS — M2042 Other hammer toe(s) (acquired), left foot: Secondary | ICD-10-CM | POA: Diagnosis not present

## 2017-12-14 ENCOUNTER — Ambulatory Visit: Payer: 59 | Admitting: Internal Medicine

## 2017-12-14 ENCOUNTER — Encounter: Payer: Self-pay | Admitting: Internal Medicine

## 2017-12-14 VITALS — BP 112/68 | HR 82 | Ht 69.0 in | Wt 202.0 lb

## 2017-12-14 DIAGNOSIS — E7849 Other hyperlipidemia: Secondary | ICD-10-CM

## 2017-12-14 DIAGNOSIS — E1151 Type 2 diabetes mellitus with diabetic peripheral angiopathy without gangrene: Secondary | ICD-10-CM | POA: Diagnosis not present

## 2017-12-14 LAB — POCT GLYCOSYLATED HEMOGLOBIN (HGB A1C): HEMOGLOBIN A1C: 5.8 % — AB (ref 4.0–5.6)

## 2017-12-14 MED ORDER — BENAZEPRIL HCL 40 MG PO TABS: 40.0000 mg | ORAL_TABLET | Freq: Every day | ORAL | 2 refills | Status: DC

## 2017-12-14 MED ORDER — METFORMIN HCL 1000 MG PO TABS: 1000.0000 mg | ORAL_TABLET | Freq: Two times a day (BID) | ORAL | 1 refills | Status: DC

## 2017-12-14 NOTE — Progress Notes (Signed)
Patient ID: [name], female   DOB: [birth year], 62 y.o.   MRN: [id number]  HPI: [name] is a 62 y.o.-year-old female, returning for follow-up for DM2, dx in 2000, non-insulin-dependent, uncontrolled, with complications (PVD, CKD). Last visit 8 months ago.  She is not usually compliant with the appointments.  At last visit, she lost 13 pounds by improving diet: Eliminating processed foods and sweets. She also stopped red meat and other meats.  She lost another 7 pounds since last visit.  Last hemoglobin A1c was: Lab Results  Component Value Date   HGBA1C 5.9 04/11/2017   HGBA1C 8.1 06/06/2016   HGBA1C 8.1 03/07/2016   Pt is on a regimen of: - Metformin 2000 mg with dinner - Trulicity 1.5 mg weekly - Glipizide 5 mg before dinner >> added 02/2016  She was on Farxiga 10 mg daily in am >> stopped 08/2017 after mixup at the pharmacy. She was on Victoza up to 1 year ago when insurance did not cover it. She was on Kombiglyze = Metformin + Onglyza (Saxagliptin) - 1000-2.5 mg daily >> rash  Pt checks her sugars 0-1 X a day: - am: 117, 120, 140-160 >> 73, 100-120 >> 110-115 - 2h after b'fast: n/c >> 221 >> n/c >> 126 - before lunch: n/c >> 138 >> n/c - 2h after lunch: 160-180s, 210 >> 160-177 >> n/c  - before dinner: n/c >> 208 >> n/c >> 120-128 - 2h after dinner: n/c - bedtime: n/c - nighttime: n/c Lowest sugar was low 70s x2 >> 80s ; she has hypoglycemia awareness in the 80s. Highest sugar was 240 x1 >> 130 >> 130.  Glucometer: One Touch Ultra  - + CKD, last BUN/creatinine:  Lab Results  Component Value Date   BUN 26 (H) 07/04/2017   CREATININE 1.17 07/04/2017   Lab Results  Component Value Date   GFRAA 85 04/22/2008   GFRAA 85 11/22/2006   Lab Results  Component Value Date   MICRALBCREAT 0.7 06/07/2015   MICRALBCREAT 0.8 02/03/2015   MICRALBCREAT 0.5 06/05/2014   MICRALBCREAT 0.4 04/05/2012   MICRALBCREAT 0.6 11/01/2010   MICRALBCREAT 0.4 02/14/2010   MICRALBCREAT 5.1 04/22/2008   MICRALBCREAT 3.0 11/22/2006  On benazepril.  -+  HL. Last set of lipids: Lab Results  Component Value Date   CHOL 162 07/04/2017   HDL 56.50 07/04/2017   LDLCALC 87 07/04/2017   LDLDIRECT 149.9 02/14/2010   TRIG 96.0 07/04/2017   CHOLHDL 3 07/04/2017  On Lipitor 40 and ezetimibe 10. - last eye exam was in 04/2017: No DR  - + numbness and tingling in her feet after her back surgery  She also has HTN, Had back surgery 2007. Back pain increased 02/2015 >> saw Dr Tamala Julian >> started Gabapentin >> disequilibrium. She was on steroids then >> sugars higher. In 03/2015: spine steroid inj.  ROS: Constitutional: + weight loss, no fatigue, no subjective hyperthermia, no subjective hypothermia Eyes: no blurry vision, no xerophthalmia ENT: no sore throat, no nodules palpated in throat, no dysphagia, no odynophagia, no hoarseness Cardiovascular: no CP/no SOB/no palpitations/no leg swelling Respiratory: no cough/no SOB/no wheezing Gastrointestinal: no N/no V/no D/no C/no acid reflux Musculoskeletal: no muscle aches/no joint aches Skin: no rashes, no hair loss Neurological: no tremors/+ numbness/+ tingling/no dizziness  I reviewed pt's medications, allergies, PMH, social hx, family hx, and changes were documented in the history of present illness. Otherwise, unchanged from my initial visit note.   Past Medical History:  Diagnosis Date  .  Colitis    in her 62s; quiescent since  . DM (diabetes mellitus) ([place])   . HTN (hypertension)   . Hyperlipidemia    Past Surgical History:  Procedure Laterality Date  . CERVIX SURGERY  2001   Cryosurgery, Dr Connye Burkitt  . COLONOSCOPY  2009   Negative, Dr. Delfin Edis  . FOOT SURGERY     bone spurs resected, Dr Aida Puffer  . KNEE ARTHROSCOPY  1980   Right  . LAMINECTOMY  2007   X 2, Dr Gladstone Lighter   Social History   Social History  . Marital Status: Divorced    Spouse Name: N/A  . Number of Children: 1   Occupational  History  . 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 Smoking status: Former Smoker    Quit date: 1996  . Alcohol Use: Yes     Comment:  rarely  . Drug Use: No   Current Outpatient Medications on File Prior to Visit  Medication Sig Dispense Refill  . aspirin 81 MG tablet Take 81 mg by mouth daily.      Marland Kitchen atorvastatin (LIPITOR) 40 MG tablet TAKE ONE TABLET BY MOUTH DAILY AT 6PM 90 tablet 3  . benazepril (LOTENSIN) 40 MG tablet TAKE ONE TABLET BY MOUTH DAILY 90 tablet 2  . dapagliflozin propanediol (FARXIGA) 10 MG TABS tablet Take 10 mg by mouth daily. 90 tablet 3  . estradiol (ESTRACE) 0.1 MG/GM vaginal cream Place 1 Applicatorful vaginally at bedtime. Once weekly for 1 month, then .5 ml monthly.    . ezetimibe (ZETIA) 10 MG tablet TAKE ONE TABLET BY MOUTH DAILY 30 tablet 5  . glipiZIDE (GLUCOTROL) 5 MG tablet TAKE ONE TABLET BY MOUTH DAILY 15-30 MINUTES BEFORE DINNER 30 tablet 0  . glucose blood (ONETOUCH VERIO) test strip Use to check blood sugar 1 time per day. Dx Code E11.9 100 each 12  . hydrochlorothiazide (HYDRODIURIL) 25 MG tablet TAKE 1/2 TABLET BY MOUTH DAILY 45 tablet 3  . hyoscyamine (LEVSIN SL) 0.125 MG SL tablet Place 1 tablet (0.125 mg total) under the tongue every 4 (four) hours as needed. 30 tablet 0  . metFORMIN (GLUCOPHAGE) 1000 MG tablet TAKE ONE TABLET BY MOUTH TWICE A DAY 180 tablet 1  . metoprolol tartrate (LOPRESSOR) 50 MG tablet TAKE 1/2 TABLET BY MOUTH TWO TIMES A DAY 30 tablet 5  . TRULICITY 1.5 IR/4.4RX SOPN INJECT 1.5MG UNDER SKIN ONCE WEEKLY 2 pen 1  . [DISCONTINUED] Saxagliptin-Metformin (KOMBIGLYZE XR) 2.11-998 MG TB24 2 daily 30 tablet 2   No current facility-administered medications on file prior to visit.    Allergies  Allergen Reactions  . Atorvastatin     REACTION: ELEVATES LFT'S. Pt states she is currently taking Atorvastatin 40 mg (1/2 tablet daily) and does not recall hab=ving any issues with it. (07/14/14)  . Codeine  Nausea Only       . Neomycin-Bacitracin Zn-Polymyx Rash  . Onglyza [Saxagliptin Hydrochloride] Rash    This occurred after starting Kombiglyze . This was present as a rash over the hands and feet dorsally. She has been able to take metformin in the past as well as Janumet without problems.  . Sulfamethoxazole-Trimethoprim Nausea And Vomiting     Nausea was extreme.     Family History  Problem Relation Age of Onset  . Diabetes Father   . Hypertension Father   . Heart attack Father 15       precipitated by bleeding ulcer  .  Diabetes Mother   . Hypertension Mother   . Coronary artery disease Mother   . Heart attack Paternal Grandfather        ? 45  . Heart attack Maternal Grandfather 68  . Diabetes Maternal Grandmother   . Stroke Maternal Grandmother 82  . Cancer Neg Hx    PE: BP 112/68   Pulse 82   Ht 5' 9"  (1.753 m)   Wt 202 lb (91.6 kg)   SpO2 98%   BMI 29.83 kg/m  Body mass index is 29.83 kg/m.  Wt Readings from Last 3 Encounters:  12/14/17 202 lb (91.6 kg)  07/04/17 207 lb (93.9 kg)  04/11/17 209 lb (94.8 kg)   Constitutional: overweight, in NAD Eyes: PERRLA, EOMI, no exophthalmos ENT: moist mucous membranes, no thyromegaly, no cervical lymphadenopathy Cardiovascular: RRR, No MRG Respiratory: CTA B Gastrointestinal: abdomen soft, NT, ND, BS+ Musculoskeletal: no deformities, strength intact in all 4 Skin: moist, warm, no rashes Neurological: no tremor with outstretched hands, DTR normal in all 4  ASSESSMENT: 1. DM2, non-insulin-dependent, uncontrolled, with complications - PVD - CKD  2. HL  PLAN:  1. Patient with long-standing, previously uncontrolled diabetes, with greatly improved control after starting to improve her diet and be compliant with her diabetic medication.  At last visit, her HbA1c was 5.9% on metformin, glipizide, GLP-1 receptor agonist and SGLT2 inhibitor.  She also lost weight.  We did not change her regimen at that time. - at this visit,  she is off [place] (pb at the pharmacy) - sugars did not change >> did not restart - At this visit, sugars are great after continuing to improve her diet.  She cut out fatty foods since last visit along with refined carbs with very good results in terms of both diabetes control and weight.  - At this visit, I advised her to try to stop glipizide and at next visit we may need to reduce or even stop Trulicity - I suggested to:  Patient Instructions  Please continue: - Metformin 2000 mg with dinner. - Trulicity 1.5 mg weekly  Please try to stop: - Glipizide 5 mg before dinner.  Please return in 6 months with your sugar log.  - today, HbA1c is 5.8% (great!) - continue checking sugars at different times of the day - check 1x a day, rotating checks - advised for yearly eye exams >> she is UTD - Return to clinic in 6 mo with sugar log   2. HL - Reviewed latest lipid panel from 06/2017: LDL lower than 100, the rest of the lipid fractions at goal - Continues the statin and ezetimibe without side effects.  She had some joint pains in the past and was wondering whether these are from statin.  Philemon Kingdom, MD PhD [facility]

## 2017-12-14 NOTE — Patient Instructions (Addendum)
Please continue: - Metformin 2000 mg with dinner. - Trulicity 1.5 mg weekly  Please try to stop: - Glipizide 5 mg before dinner.  Please return in 6 months with your sugar log.

## 2017-12-25 ENCOUNTER — Other Ambulatory Visit: Payer: Self-pay | Admitting: Internal Medicine

## 2018-01-25 ENCOUNTER — Other Ambulatory Visit: Payer: Self-pay | Admitting: Internal Medicine

## 2018-02-21 ENCOUNTER — Other Ambulatory Visit: Payer: Self-pay | Admitting: Internal Medicine

## 2018-04-11 ENCOUNTER — Encounter: Payer: Self-pay | Admitting: Internal Medicine

## 2018-04-19 ENCOUNTER — Other Ambulatory Visit: Payer: Self-pay | Admitting: Internal Medicine

## 2018-05-29 DIAGNOSIS — Z01419 Encounter for gynecological examination (general) (routine) without abnormal findings: Secondary | ICD-10-CM | POA: Diagnosis not present

## 2018-05-29 DIAGNOSIS — Z1231 Encounter for screening mammogram for malignant neoplasm of breast: Secondary | ICD-10-CM | POA: Diagnosis not present

## 2018-05-29 LAB — HM MAMMOGRAPHY

## 2018-06-07 ENCOUNTER — Encounter: Payer: Self-pay | Admitting: Internal Medicine

## 2018-06-11 ENCOUNTER — Telehealth: Payer: Self-pay | Admitting: Internal Medicine

## 2018-06-12 ENCOUNTER — Ambulatory Visit: Payer: 59 | Admitting: Internal Medicine

## 2018-06-16 ENCOUNTER — Other Ambulatory Visit: Payer: Self-pay | Admitting: Internal Medicine

## 2018-06-19 NOTE — Telephone Encounter (Signed)
error 

## 2018-06-28 ENCOUNTER — Other Ambulatory Visit: Payer: Self-pay | Admitting: Internal Medicine

## 2018-06-30 NOTE — Progress Notes (Signed)
Subjective:    Patient ID: [name], female    DOB: 11/04/1955, 62 y.o.   MRN: [id number]  HPI She is here for a physical exam.    She thinks she has allergies.  She has runny eyes.  Later she has a runny nose.  She had see her eye doctor and they did say she may have dry eyes.    She has no concerns.  Medications and allergies reviewed with patient and updated if appropriate.  Patient Active Problem List   Diagnosis Date Noted  . Fatty liver 07/16/2016  . Hair thinning 07/03/2016  . Lumbar back pain with radiculopathy affecting left lower extremity 02/22/2015  . PVC's (premature ventricular contractions) 06/09/2014  . Essential hypertension 02/14/2010  . Type 2 diabetes mellitus with peripheral vascular disease ([place]) 11/21/2006  . Hyperlipidemia 11/21/2006    Current Outpatient Medications on File Prior to Visit  Medication Sig Dispense Refill  . atorvastatin (LIPITOR) 40 MG tablet TAKE ONE TABLET BY MOUTH DAILY AT 6PM 90 tablet 3  . benazepril (LOTENSIN) 40 MG tablet Take 1 tablet (40 mg total) by mouth daily. 90 tablet 2  . ezetimibe (ZETIA) 10 MG tablet TAKE ONE TABLET BY MOUTH DAILY 30 tablet 5  . glipiZIDE (GLUCOTROL) 5 MG tablet TAKE ONE TABLET BY MOUTH DAILY 15 TO 30 MINUTES BEFORE DINNER 30 tablet 0  . glucose blood (ONETOUCH VERIO) test strip Use to check blood sugar 1 time per day. Dx Code E11.9 100 each 12  . hydrochlorothiazide (HYDRODIURIL) 25 MG tablet TAKE 1/2 TABLET BY MOUTH DAILY 45 tablet 3  . hyoscyamine (LEVSIN SL) 0.125 MG SL tablet Place 1 tablet (0.125 mg total) under the tongue every 4 (four) hours as needed. 30 tablet 0  . metFORMIN (GLUCOPHAGE) 1000 MG tablet Take 1 tablet (1,000 mg total) by mouth 2 (two) times daily. 180 tablet 1  . metoprolol tartrate (LOPRESSOR) 50 MG tablet TAKE 1/2 TABLET BY MOUTH TWO TIMES A DAY 30 tablet 5  . TRULICITY 1.5 VZ/8.5YI SOPN INJECT 1.5 MG UNDER THE SKIN ONCE WEEKLY 2 pen 0  . [DISCONTINUED]  Saxagliptin-Metformin (KOMBIGLYZE XR) 2.11-998 MG TB24 2 daily 30 tablet 2   No current facility-administered medications on file prior to visit.     Past Medical History:  Diagnosis Date  . Colitis    in her 56s; quiescent since  . DM (diabetes mellitus) ([place])   . HTN (hypertension)   . Hyperlipidemia     Past Surgical History:  Procedure Laterality Date  . CERVIX SURGERY  2001   Cryosurgery, Dr Connye Burkitt  . COLONOSCOPY  2009   Negative, Dr. Delfin Edis  . FOOT SURGERY     bone spurs resected, Dr Aida Puffer  . KNEE ARTHROSCOPY  1980   Right  . LAMINECTOMY  2007   X 2, Dr Gladstone Lighter    Social History   Socioeconomic History  . Marital status: Divorced    Spouse name: Not on file  . Number of children: Not on file  . Years of education: Not on file  . Highest education level: Not on file  Occupational History  . Not on file  Social Needs  . Financial resource strain: Not on file  . Food insecurity:    Worry: Not on file    Inability: Not on file  . Transportation needs:    Medical: Not on file    Non-medical: Not on file  Tobacco Use  . Smoking status: Former Smoker  Last attempt to quit: 07/17/1977    Years since quitting: 40.9  . Smokeless tobacco: Never Used  . Tobacco comment: Smoked in [place] ; [phone], up to 1/2 ppd  Substance and Sexual Activity  . Alcohol use: Yes    Comment:  rarely  . Drug use: No  . Sexual activity: Not on file  Lifestyle  . Physical activity:    Days per week: Not on file    Minutes per session: Not on file  . Stress: Not on file  Relationships  . Social connections:    Talks on phone: Not on file    Gets together: Not on file    Attends religious service: Not on file    Active member of club or organization: Not on file    Attends meetings of clubs or organizations: Not on file    Relationship status: Not on file  Other Topics Concern  . Not on file  Social History Narrative  . Not on file    Family History    Problem Relation Age of Onset  . Diabetes Father   . Hypertension Father   . Heart attack Father 52       precipitated by bleeding ulcer  . Diabetes Mother   . Hypertension Mother   . Coronary artery disease Mother   . Heart attack Paternal Grandfather        ? 57  . Heart attack Maternal Grandfather 68  . Diabetes Maternal Grandmother   . Stroke Maternal Grandmother 82  . Cancer Neg Hx     Review of Systems  Constitutional: Negative for chills and fever.  Eyes: Negative for visual disturbance.  Respiratory: Negative for cough and shortness of breath.   Cardiovascular: Negative for palpitations and leg swelling.  Gastrointestinal: Positive for constipation (can be 4-5 days between BM, no hard stool). Negative for abdominal pain, blood in stool, diarrhea and nausea.       No gerd  Genitourinary: Negative for dysuria and hematuria.  Musculoskeletal: Positive for back pain (chronic). Negative for arthralgias.  Skin: Negative for color change and rash.  Neurological: Negative for dizziness, light-headedness and headaches.  Psychiatric/Behavioral: Negative for dysphoric mood. The patient is not nervous/anxious.        Objective:   Vitals:   07/05/18 0825  BP: 112/68  Pulse: 79  Resp: 16  Temp: 98.1 F (36.7 C)  SpO2: 98%   Filed Weights   07/05/18 0825  Weight: 213 lb (96.6 kg)   Body mass index is 31.45 kg/m.  BP Readings from Last 3 Encounters:  07/05/18 112/68  12/14/17 112/68  07/04/17 130/68    Wt Readings from Last 3 Encounters:  07/05/18 213 lb (96.6 kg)  12/14/17 202 lb (91.6 kg)  07/04/17 207 lb (93.9 kg)     Physical Exam Constitutional: She appears well-developed and well-nourished. No distress.  HENT:  Head: Normocephalic and atraumatic.  Right Ear: External ear normal. Normal ear canal and TM Left Ear: External ear normal.  Normal ear canal and TM Mouth/Throat: Oropharynx is clear and moist.  Eyes: Conjunctivae and EOM are normal.  Neck:  Neck supple. No tracheal deviation present. No thyromegaly present.  No carotid bruit  Cardiovascular: Normal rate, regular rhythm and normal heart sounds.   No murmur heard.  No edema. Pulmonary/Chest: Effort normal and breath sounds normal. No respiratory distress. She has no wheezes. She has no rales.  Breast: deferred to Gyn Abdominal: Soft. She exhibits no distension. There is  no tenderness.  Lymphadenopathy: She has no cervical adenopathy.  Skin: Skin is warm and dry. She is not diaphoretic.  Psychiatric: She has a normal mood and affect. Her behavior is normal.        Assessment & Plan:   Physical exam: Screening blood work   ordered Immunizations   Flu vaccine today, discussed shingrix Colonoscopy    due  - will do it next month Mammogram   Up to date  Gyn   Up to date  Dexa  Never had one -- will schedule Eye exams  Due  - will schedule EKG   Done 2015 Exercise  Going to gym, but not as much recently - will increase  Weight    Encouraged weight loss Skin   No concerns Substance abuse   none  See Problem List for Assessment and Plan of chronic medical problems.    FU in 6 months

## 2018-06-30 NOTE — Patient Instructions (Addendum)
Tests ordered today. Your results will be released to MyChart (or called to you) after review, usually within 72hours after test completion. If any changes need to be made, you will be notified at that same time.  All other Health Maintenance issues reviewed.   All recommended immunizations and age-appropriate screenings are up-to-date or discussed.  Flu immunization administered today.    Medications reviewed and updated.  Changes include :   none   Please followup in 6 months    Health Maintenance, Female Adopting a healthy lifestyle and getting preventive care can go a long way to promote health and wellness. Talk with your health care provider about what schedule of regular examinations is right for you. This is a good chance for you to check in with your provider about disease prevention and staying healthy. In between checkups, there are plenty of things you can do on your own. Experts have done a lot of research about which lifestyle changes and preventive measures are most likely to keep you healthy. Ask your health care provider for more information. Weight and diet Eat a healthy diet  Be sure to include plenty of vegetables, fruits, low-fat dairy products, and lean protein.  Do not eat a lot of foods high in solid fats, added sugars, or salt.  Get regular exercise. This is one of the most important things you can do for your health. ? Most adults should exercise for at least 150 minutes each week. The exercise should increase your heart rate and make you sweat (moderate-intensity exercise). ? Most adults should also do strengthening exercises at least twice a week. This is in addition to the moderate-intensity exercise.  Maintain a healthy weight  Body mass index (BMI) is a measurement that can be used to identify possible weight problems. It estimates body fat based on height and weight. Your health care provider can help determine your BMI and help you achieve or maintain a  healthy weight.  For females 20 years of age and older: ? A BMI below 18.5 is considered underweight. ? A BMI of 18.5 to 24.9 is normal. ? A BMI of 25 to 29.9 is considered overweight. ? A BMI of 30 and above is considered obese.  Watch levels of cholesterol and blood lipids  You should start having your blood tested for lipids and cholesterol at 62 years of age, then have this test every 5 years.  You may need to have your cholesterol levels checked more often if: ? Your lipid or cholesterol levels are high. ? You are older than 62 years of age. ? You are at high risk for heart disease.  Cancer screening Lung Cancer  Lung cancer screening is recommended for adults 55-80 years old who are at high risk for lung cancer because of a history of smoking.  A yearly low-dose CT scan of the lungs is recommended for people who: ? Currently smoke. ? Have quit within the past 15 years. ? Have at least a 30-pack-year history of smoking. A pack year is smoking an average of one pack of cigarettes a day for 1 year.  Yearly screening should continue until it has been 15 years since you quit.  Yearly screening should stop if you develop a health problem that would prevent you from having lung cancer treatment.  Breast Cancer  Practice breast self-awareness. This means understanding how your breasts normally appear and feel.  It also means doing regular breast self-exams. Let your health care provider know about   changes, no matter how small.  If you are in your 20s or 30s, you should have a clinical breast exam (CBE) by a health care provider every 1-3 years as part of a regular health exam.  If you are 40 or older, have a CBE every year. Also consider having a breast X-ray (mammogram) every year.  If you have a family history of breast cancer, talk to your health care provider about genetic screening.  If you are at high risk for breast cancer, talk to your health care provider about  having an MRI and a mammogram every year.  Breast cancer gene (BRCA) assessment is recommended for women who have family members with BRCA-related cancers. BRCA-related cancers include: ? Breast. ? Ovarian. ? Tubal. ? Peritoneal cancers.  Results of the assessment will determine the need for genetic counseling and BRCA1 and BRCA2 testing.  Cervical Cancer Your health care provider may recommend that you be screened regularly for cancer of the pelvic organs (ovaries, uterus, and vagina). This screening involves a pelvic examination, including checking for microscopic changes to the surface of your cervix (Pap test). You may be encouraged to have this screening done every 3 years, beginning at age 21.  For women ages 30-65, health care providers may recommend pelvic exams and Pap testing every 3 years, or they may recommend the Pap and pelvic exam, combined with testing for human papilloma virus (HPV), every 5 years. Some types of HPV increase your risk of cervical cancer. Testing for HPV may also be done on women of any age with unclear Pap test results.  Other health care providers may not recommend any screening for nonpregnant women who are considered low risk for pelvic cancer and who do not have symptoms. Ask your health care provider if a screening pelvic exam is right for you.  If you have had past treatment for cervical cancer or a condition that could lead to cancer, you need Pap tests and screening for cancer for at least 20 years after your treatment. If Pap tests have been discontinued, your risk factors (such as having a new sexual partner) need to be reassessed to determine if screening should resume. Some women have medical problems that increase the chance of getting cervical cancer. In these cases, your health care provider may recommend more frequent screening and Pap tests.  Colorectal Cancer  This type of cancer can be detected and often prevented.  Routine colorectal cancer  screening usually begins at 62 years of age and continues through 62 years of age.  Your health care provider may recommend screening at an earlier age if you have risk factors for colon cancer.  Your health care provider may also recommend using home test kits to check for hidden blood in the stool.  A small camera at the end of a tube can be used to examine your colon directly (sigmoidoscopy or colonoscopy). This is done to check for the earliest forms of colorectal cancer.  Routine screening usually begins at age 50.  Direct examination of the colon should be repeated every 5-10 years through 62 years of age. However, you may need to be screened more often if early forms of precancerous polyps or small growths are found.  Skin Cancer  Check your skin from head to toe regularly.  Tell your health care provider about any new moles or changes in moles, especially if there is a change in a mole's shape or color.  Also tell your health care provider if   you have a mole that is larger than the size of a pencil eraser.  Always use sunscreen. Apply sunscreen liberally and repeatedly throughout the day.  Protect yourself by wearing long sleeves, pants, a wide-brimmed hat, and sunglasses whenever you are outside.  Heart disease, diabetes, and high blood pressure  High blood pressure causes heart disease and increases the risk of stroke. High blood pressure is more likely to develop in: ? People who have blood pressure in the high end of the normal range (130-139/85-89 mm Hg). ? People who are overweight or obese. ? People who are African American.  If you are 18-39 years of age, have your blood pressure checked every 3-5 years. If you are 40 years of age or older, have your blood pressure checked every year. You should have your blood pressure measured twice-once when you are at a hospital or clinic, and once when you are not at a hospital or clinic. Record the average of the two measurements.  To check your blood pressure when you are not at a hospital or clinic, you can use: ? An automated blood pressure machine at a pharmacy. ? A home blood pressure monitor.  If you are between 55 years and 79 years old, ask your health care provider if you should take aspirin to prevent strokes.  Have regular diabetes screenings. This involves taking a blood sample to check your fasting blood sugar level. ? If you are at a normal weight and have a low risk for diabetes, have this test once every three years after 62 years of age. ? If you are overweight and have a high risk for diabetes, consider being tested at a younger age or more often. Preventing infection Hepatitis B  If you have a higher risk for hepatitis B, you should be screened for this virus. You are considered at high risk for hepatitis B if: ? You were born in a country where hepatitis B is common. Ask your health care provider which countries are considered high risk. ? Your parents were born in a high-risk country, and you have not been immunized against hepatitis B (hepatitis B vaccine). ? You have HIV or AIDS. ? You use needles to inject street drugs. ? You live with someone who has hepatitis B. ? You have had sex with someone who has hepatitis B. ? You get hemodialysis treatment. ? You take certain medicines for conditions, including cancer, organ transplantation, and autoimmune conditions.  Hepatitis C  Blood testing is recommended for: ? Everyone born from 1945 through 1965. ? Anyone with known risk factors for hepatitis C.  Sexually transmitted infections (STIs)  You should be screened for sexually transmitted infections (STIs) including gonorrhea and chlamydia if: ? You are sexually active and are younger than 62 years of age. ? You are older than 62 years of age and your health care provider tells you that you are at risk for this type of infection. ? Your sexual activity has changed since you were last screened  and you are at an increased risk for chlamydia or gonorrhea. Ask your health care provider if you are at risk.  If you do not have HIV, but are at risk, it may be recommended that you take a prescription medicine daily to prevent HIV infection. This is called pre-exposure prophylaxis (PrEP). You are considered at risk if: ? You are sexually active and do not regularly use condoms or know the HIV status of your partner(s). ? You take drugs by   injection. ? You are sexually active with a partner who has HIV.  Talk with your health care provider about whether you are at high risk of being infected with HIV. If you choose to begin PrEP, you should first be tested for HIV. You should then be tested every 3 months for as long as you are taking PrEP. Pregnancy  If you are premenopausal and you may become pregnant, ask your health care provider about preconception counseling.  If you may become pregnant, take 400 to 800 micrograms (mcg) of folic acid every day.  If you want to prevent pregnancy, talk to your health care provider about birth control (contraception). Osteoporosis and menopause  Osteoporosis is a disease in which the bones lose minerals and strength with aging. This can result in serious bone fractures. Your risk for osteoporosis can be identified using a bone density scan.  If you are 65 years of age or older, or if you are at risk for osteoporosis and fractures, ask your health care provider if you should be screened.  Ask your health care provider whether you should take a calcium or vitamin D supplement to lower your risk for osteoporosis.  Menopause may have certain physical symptoms and risks.  Hormone replacement therapy may reduce some of these symptoms and risks. Talk to your health care provider about whether hormone replacement therapy is right for you. Follow these instructions at home:  Schedule regular health, dental, and eye exams.  Stay current with your  immunizations.  Do not use any tobacco products including cigarettes, chewing tobacco, or electronic cigarettes.  If you are pregnant, do not drink alcohol.  If you are breastfeeding, limit how much and how often you drink alcohol.  Limit alcohol intake to no more than 1 drink per day for nonpregnant women. One drink equals 12 ounces of beer, 5 ounces of wine, or 1 ounces of hard liquor.  Do not use street drugs.  Do not share needles.  Ask your health care provider for help if you need support or information about quitting drugs.  Tell your health care provider if you often feel depressed.  Tell your health care provider if you have ever been abused or do not feel safe at home. This information is not intended to replace advice given to you by your health care provider. Make sure you discuss any questions you have with your health care provider. Document Released: 01/16/2011 Document Revised: 12/09/2015 Document Reviewed: 04/06/2015 [employer]vier Interactive Patient Education  2018 [employer]  

## 2018-07-05 ENCOUNTER — Ambulatory Visit (INDEPENDENT_AMBULATORY_CARE_PROVIDER_SITE_OTHER): Payer: 59 | Admitting: Internal Medicine

## 2018-07-05 ENCOUNTER — Other Ambulatory Visit (INDEPENDENT_AMBULATORY_CARE_PROVIDER_SITE_OTHER): Payer: 59

## 2018-07-05 ENCOUNTER — Encounter: Payer: Self-pay | Admitting: Internal Medicine

## 2018-07-05 ENCOUNTER — Encounter: Payer: 59 | Admitting: Internal Medicine

## 2018-07-05 VITALS — BP 112/68 | HR 79 | Temp 98.1°F | Resp 16 | Ht 69.0 in | Wt 213.0 lb

## 2018-07-05 DIAGNOSIS — Z13820 Encounter for screening for osteoporosis: Secondary | ICD-10-CM

## 2018-07-05 DIAGNOSIS — Z0000 Encounter for general adult medical examination without abnormal findings: Secondary | ICD-10-CM | POA: Diagnosis not present

## 2018-07-05 DIAGNOSIS — E1151 Type 2 diabetes mellitus with diabetic peripheral angiopathy without gangrene: Secondary | ICD-10-CM | POA: Diagnosis not present

## 2018-07-05 DIAGNOSIS — E2839 Other primary ovarian failure: Secondary | ICD-10-CM

## 2018-07-05 DIAGNOSIS — Z23 Encounter for immunization: Secondary | ICD-10-CM | POA: Diagnosis not present

## 2018-07-05 DIAGNOSIS — E7849 Other hyperlipidemia: Secondary | ICD-10-CM | POA: Diagnosis not present

## 2018-07-05 DIAGNOSIS — I10 Essential (primary) hypertension: Secondary | ICD-10-CM

## 2018-07-05 DIAGNOSIS — K760 Fatty (change of) liver, not elsewhere classified: Secondary | ICD-10-CM

## 2018-07-05 LAB — CBC WITH DIFFERENTIAL/PLATELET
Basophils Absolute: 0.1 10*3/uL (ref 0.0–0.1)
Basophils Relative: 0.8 % (ref 0.0–3.0)
Eosinophils Absolute: 0.5 10*3/uL (ref 0.0–0.7)
Eosinophils Relative: 5.8 % — ABNORMAL HIGH (ref 0.0–5.0)
HCT: 43.4 % (ref 36.0–46.0)
Hemoglobin: 14.5 g/dL (ref 12.0–15.0)
Lymphocytes Relative: 24.4 % (ref 12.0–46.0)
Lymphs Abs: 2 10*3/uL (ref 0.7–4.0)
MCHC: 33.3 g/dL (ref 30.0–36.0)
MCV: 92.2 fl (ref 78.0–100.0)
Monocytes Absolute: 0.5 10*3/uL (ref 0.1–1.0)
Monocytes Relative: 6.4 % (ref 3.0–12.0)
Neutro Abs: 5.3 10*3/uL (ref 1.4–7.7)
Neutrophils Relative %: 62.6 % (ref 43.0–77.0)
Platelets: 132 10*3/uL — ABNORMAL LOW (ref 150.0–400.0)
RBC: 4.71 Mil/uL (ref 3.87–5.11)
RDW: 13 % (ref 11.5–15.5)
WBC: 8.4 10*3/uL (ref 4.0–10.5)

## 2018-07-05 LAB — COMPREHENSIVE METABOLIC PANEL
ALT: 38 U/L — ABNORMAL HIGH (ref 0–35)
AST: 46 U/L — ABNORMAL HIGH (ref 0–37)
Albumin: 4.5 g/dL (ref 3.5–5.2)
Alkaline Phosphatase: 92 U/L (ref 39–117)
BUN: 26 mg/dL — AB (ref 6–23)
CO2: 25 mEq/L (ref 19–32)
Calcium: 10.1 mg/dL (ref 8.4–10.5)
Chloride: 105 mEq/L (ref 96–112)
Creatinine, Ser: 1.12 mg/dL (ref 0.40–1.20)
GFR: 63.28 mL/min (ref >60.00)
Glucose, Bld: 149 mg/dL — ABNORMAL HIGH (ref 70–99)
POTASSIUM: 4.2 meq/L (ref 3.5–5.1)
SODIUM: 142 meq/L (ref 135–145)
Total Bilirubin: 0.8 mg/dL (ref 0.2–1.2)
Total Protein: 8.1 g/dL (ref 6.0–8.3)

## 2018-07-05 LAB — HEMOGLOBIN A1C: HEMOGLOBIN A1C: 7.7 % — AB (ref 4.6–6.5)

## 2018-07-05 LAB — LIPID PANEL
CHOLESTEROL: 176 mg/dL (ref 0–200)
HDL: 56.9 mg/dL (ref >39.00)
LDL CALC: 101 mg/dL — AB (ref 0–99)
NonHDL: 119.59
Total CHOL/HDL Ratio: 3
Triglycerides: 94 mg/dL (ref 0.0–149.0)
VLDL: 18.8 mg/dL (ref 0.0–40.0)

## 2018-07-05 LAB — TSH: TSH: 0.92 u[IU]/mL (ref 0.35–4.50)

## 2018-07-05 NOTE — Assessment & Plan Note (Signed)
Has not tolerated statins in the past Check lipid panel  Continue daily zetria Regular exercise and healthy diet encouraged

## 2018-07-05 NOTE — Progress Notes (Signed)
Acute Office Visit  Subjective:    Patient ID: [name], female    DOB: October 12, 1955, 62 y.o.   MRN: [id number]  Chief Complaint  Patient presents with  . CPE    fasting    HPI Patient is in today for annual exam  Past Medical History:  Diagnosis Date  . Colitis    in her 9s; quiescent since  . DM (diabetes mellitus) ([place])   . HTN (hypertension)   . Hyperlipidemia     Past Surgical History:  Procedure Laterality Date  . CERVIX SURGERY  2001   Cryosurgery, Dr Connye Burkitt  . COLONOSCOPY  2009   Negative, Dr. Delfin Edis  . FOOT SURGERY     bone spurs resected, Dr Aida Puffer  . KNEE ARTHROSCOPY  1980   Right  . LAMINECTOMY  2007   X 2, Dr Gladstone Lighter    Family History  Problem Relation Age of Onset  . Diabetes Father   . Hypertension Father   . Heart attack Father 79       precipitated by bleeding ulcer  . Diabetes Mother   . Hypertension Mother   . Coronary artery disease Mother   . Heart attack Paternal Grandfather        ? 38  . Heart attack Maternal Grandfather 68  . Diabetes Maternal Grandmother   . Stroke Maternal Grandmother 82  . Cancer Neg Hx     Social History   Socioeconomic History  . Marital status: Divorced    Spouse name: Not on file  . Number of children: Not on file  . Years of education: Not on file  . Highest education level: Not on file  Occupational History  . Not on file  Social Needs  . Financial resource strain: Not on file  . Food insecurity:    Worry: Not on file    Inability: Not on file  . Transportation needs:    Medical: Not on file    Non-medical: Not on file  Tobacco Use  . Smoking status: Former Smoker    Last attempt to quit: 07/17/1977    Years since quitting: 40.9  . Smokeless tobacco: Never Used  . Tobacco comment: Smoked in [place] ; [phone], up to 1/2 ppd  Substance and Sexual Activity  . Alcohol use: Yes    Comment:  rarely  . Drug use: No  . Sexual activity: Not on file  Lifestyle  . Physical  activity:    Days per week: Not on file    Minutes per session: Not on file  . Stress: Not on file  Relationships  . Social connections:    Talks on phone: Not on file    Gets together: Not on file    Attends religious service: Not on file    Active member of club or organization: Not on file    Attends meetings of clubs or organizations: Not on file    Relationship status: Not on file  . Intimate partner violence:    Fear of current or ex partner: Not on file    Emotionally abused: Not on file    Physically abused: Not on file    Forced sexual activity: Not on file  Other Topics Concern  . Not on file  Social History Narrative  . Not on file    Outpatient Medications Prior to Visit  Medication Sig Dispense Refill  . atorvastatin (LIPITOR) 40 MG tablet TAKE ONE TABLET BY MOUTH DAILY AT [facility]  90 tablet 3  . benazepril (LOTENSIN) 40 MG tablet Take 1 tablet (40 mg total) by mouth daily. 90 tablet 2  . ezetimibe (ZETIA) 10 MG tablet TAKE ONE TABLET BY MOUTH DAILY 30 tablet 5  . glipiZIDE (GLUCOTROL) 5 MG tablet TAKE ONE TABLET BY MOUTH DAILY 15 TO 30 MINUTES BEFORE DINNER 30 tablet 0  . glucose blood (ONETOUCH VERIO) test strip Use to check blood sugar 1 time per day. Dx Code E11.9 100 each 12  . hydrochlorothiazide (HYDRODIURIL) 25 MG tablet TAKE 1/2 TABLET BY MOUTH DAILY 45 tablet 3  . hyoscyamine (LEVSIN SL) 0.125 MG SL tablet Place 1 tablet (0.125 mg total) under the tongue every 4 (four) hours as needed. 30 tablet 0  . metFORMIN (GLUCOPHAGE) 1000 MG tablet Take 1 tablet (1,000 mg total) by mouth 2 (two) times daily. 180 tablet 1  . metoprolol tartrate (LOPRESSOR) 50 MG tablet TAKE 1/2 TABLET BY MOUTH TWO TIMES A DAY 30 tablet 5  . TRULICITY 1.5 QQ/7.6PP SOPN INJECT 1.5 MG UNDER THE SKIN ONCE WEEKLY 2 pen 0  . TRULICITY 1.5 JK/9.3OI SOPN INJECT 1.5MG UNDER SKIN ONCE WEEKLY 2 pen 1   No facility-administered medications prior to visit.     Allergies  Allergen Reactions  .  Atorvastatin     REACTION: ELEVATES LFT'S. Pt states she is currently taking Atorvastatin 40 mg (1/2 tablet daily) and does not recall hab=ving any issues with it. (07/14/14)  . Codeine Nausea Only       . Neomycin-Bacitracin Zn-Polymyx Rash  . Onglyza [Saxagliptin Hydrochloride] Rash    This occurred after starting Kombiglyze . This was present as a rash over the hands and feet dorsally. She has been able to take metformin in the past as well as Janumet without problems.  . Sulfamethoxazole-Trimethoprim Nausea And Vomiting     Nausea was extreme.      ROS     Objective:    Physical Exam  BP 112/68 (BP Location: Right Arm, Patient Position: Sitting, Cuff Size: Large)   Pulse 79   Temp 98.1 F (36.7 C) (Oral)   Resp 16   Ht 5' 9"  (1.753 m)   Wt 213 lb (96.6 kg)   SpO2 98%   BMI 31.45 kg/m  Wt Readings from Last 3 Encounters:  07/05/18 213 lb (96.6 kg)  12/14/17 202 lb (91.6 kg)  07/04/17 207 lb (93.9 kg)    Health Maintenance Due  Topic Date Due  . COLONOSCOPY  10/03/2017  . INFLUENZA VACCINE  02/14/2018  . OPHTHALMOLOGY EXAM  04/28/2018  . HEMOGLOBIN A1C  06/15/2018  . FOOT EXAM  07/04/2018    There are no preventive care reminders to display for this patient.   Lab Results  Component Value Date   TSH 0.92 07/05/2018   Lab Results  Component Value Date   WBC 8.4 07/05/2018   HGB 14.5 07/05/2018   HCT 43.4 07/05/2018   MCV 92.2 07/05/2018   PLT 132.0 (L) 07/05/2018   Lab Results  Component Value Date   NA 142 07/05/2018   K 4.2 07/05/2018   CO2 25 07/05/2018   GLUCOSE 149 (H) 07/05/2018   BUN 26 (H) 07/05/2018   CREATININE 1.12 07/05/2018   BILITOT 0.8 07/05/2018   ALKPHOS 92 07/05/2018   AST 46 (H) 07/05/2018   ALT 38 (H) 07/05/2018   PROT 8.1 07/05/2018   ALBUMIN 4.5 07/05/2018   CALCIUM 10.1 07/05/2018   GFR 63.28 07/05/2018   Lab Results  Component Value Date   CHOL 176 07/05/2018   Lab Results  Component Value Date   HDL 56.90  07/05/2018   Lab Results  Component Value Date   LDLCALC 101 (H) 07/05/2018   Lab Results  Component Value Date   TRIG 94.0 07/05/2018   Lab Results  Component Value Date   CHOLHDL 3 07/05/2018   Lab Results  Component Value Date   HGBA1C 7.7 (H) 07/05/2018       Assessment & Plan:   Problem List Items Addressed This Visit      Cardiovascular and Mediastinum   Type 2 diabetes mellitus with peripheral vascular disease ([place])    Following with Dr Cruzita Lederer A1c, cmp Continue regular exercise  Diabetic diet       Relevant Orders   Hemoglobin A1c (Completed)   CBC with Differential/Platelet (Completed)   Comprehensive metabolic panel (Completed)   Lipid panel (Completed)   TSH (Completed)   Essential hypertension    BP well controlled Current regimen effective and well tolerated Continue current medications at current doses Cmp, tsh      Relevant Orders   Hemoglobin A1c (Completed)   CBC with Differential/Platelet (Completed)   Comprehensive metabolic panel (Completed)   Lipid panel (Completed)   TSH (Completed)     Other   Hyperlipidemia    Has not tolerated statins in the past Check lipid panel  Continue daily zetria Regular exercise and healthy diet encouraged       Relevant Orders   Hemoglobin A1c (Completed)   CBC with Differential/Platelet (Completed)   Comprehensive metabolic panel (Completed)   Lipid panel (Completed)   TSH (Completed)    Other Visit Diagnoses    Preventative health care    -  Primary   Estrogen deficiency       Relevant Orders   DG Bone Density   Screening for osteoporosis       Relevant Orders   DG Bone Density   Need for influenza vaccination       Relevant Orders   Flu Vaccine QUAD 36+ mos IM       No orders of the defined types were placed in this encounter.    Lorrin Jackson, CMA

## 2018-07-05 NOTE — Assessment & Plan Note (Signed)
cmp Recommended weight loss

## 2018-07-05 NOTE — Assessment & Plan Note (Signed)
Following with Dr Cruzita Lederer A1c, cmp Continue regular exercise  Diabetic diet

## 2018-07-05 NOTE — Assessment & Plan Note (Signed)
BP well controlled Current regimen effective and well tolerated Continue current medications at current doses Cmp, tsh

## 2018-07-21 ENCOUNTER — Other Ambulatory Visit: Payer: Self-pay | Admitting: Internal Medicine

## 2018-07-26 ENCOUNTER — Other Ambulatory Visit: Payer: Self-pay | Admitting: Internal Medicine

## 2018-07-28 ENCOUNTER — Other Ambulatory Visit: Payer: Self-pay | Admitting: Internal Medicine

## 2018-08-07 ENCOUNTER — Other Ambulatory Visit: Payer: Self-pay | Admitting: Internal Medicine

## 2018-08-11 ENCOUNTER — Other Ambulatory Visit: Payer: Self-pay | Admitting: Internal Medicine

## 2018-08-12 ENCOUNTER — Ambulatory Visit: Payer: 59 | Admitting: Family

## 2018-08-12 ENCOUNTER — Encounter: Payer: Self-pay | Admitting: Family

## 2018-08-12 VITALS — BP 114/70 | HR 81 | Temp 98.0°F | Ht 69.0 in | Wt 217.6 lb

## 2018-08-12 DIAGNOSIS — B9789 Other viral agents as the cause of diseases classified elsewhere: Secondary | ICD-10-CM | POA: Diagnosis not present

## 2018-08-12 DIAGNOSIS — J028 Acute pharyngitis due to other specified organisms: Secondary | ICD-10-CM | POA: Diagnosis not present

## 2018-08-12 MED ORDER — FLUTICASONE PROPIONATE 50 MCG/ACT NA SUSP: 2.0000 | Freq: Every day | NASAL | 6 refills | Status: DC

## 2018-08-12 NOTE — Progress Notes (Signed)
[name] is a 63 y.o. female with the following history as recorded in EpicCare:  Patient Active Problem List   Diagnosis Date Noted  . Fatty liver 07/16/2016  . Hair thinning 07/03/2016  . Lumbar back pain with radiculopathy affecting left lower extremity 02/22/2015  . PVC's (premature ventricular contractions) 06/09/2014  . Essential hypertension 02/14/2010  . Type 2 diabetes mellitus with peripheral vascular disease ([place]) 11/21/2006  . Hyperlipidemia 11/21/2006    Current Outpatient Medications  Medication Sig Dispense Refill  . atorvastatin (LIPITOR) 40 MG tablet TAKE ONE TABLET BY MOUTH DAILY AT 6PM 90 tablet 3  . benazepril (LOTENSIN) 40 MG tablet Take 1 tablet (40 mg total) by mouth daily. 90 tablet 2  . ezetimibe (ZETIA) 10 MG tablet TAKE ONE TABLET BY MOUTH DAILY 30 tablet 4  . glipiZIDE (GLUCOTROL) 5 MG tablet TAKE ONE TABLET BY MOUTH DAILY 15 TO 30 MINUTES BEFORE DINNER 30 tablet 0  . glucose blood (ONETOUCH VERIO) test strip Use to check blood sugar 1 time per day. Dx Code E11.9 100 each 12  . hydrochlorothiazide (HYDRODIURIL) 25 MG tablet TAKE ONE-HALF TABLET BY MOUTH DAILY 15 tablet 2  . hyoscyamine (LEVSIN SL) 0.125 MG SL tablet Place 1 tablet (0.125 mg total) under the tongue every 4 (four) hours as needed. 30 tablet 0  . metFORMIN (GLUCOPHAGE) 1000 MG tablet TAKE ONE TABLET BY MOUTH TWICE A DAY 60 tablet 0  . metoprolol tartrate (LOPRESSOR) 50 MG tablet TAKE 1/2 TABLET BY MOUTH TWO TIMES A DAY 30 tablet 5  . TRULICITY 1.5 QI/3.4VQ SOPN INJECT 1.5 MG UINDER SKIN ONCE WEEL;Y 2 pen 0  . fluticasone (FLONASE) 50 MCG/ACT nasal spray Place 2 sprays into both nostrils daily. 16 g 6   No current facility-administered medications for this visit.     Allergies: Atorvastatin; Codeine; Neomycin-bacitracin zn-polymyx; Onglyza [saxagliptin hydrochloride]; and Sulfamethoxazole-trimethoprim  Past Medical History:  Diagnosis Date  . Colitis    in her 75s; quiescent since  .  DM (diabetes mellitus) ([place])   . HTN (hypertension)   . Hyperlipidemia     Past Surgical History:  Procedure Laterality Date  . CERVIX SURGERY  2001   Cryosurgery, Dr Connye Burkitt  . COLONOSCOPY  2009   Negative, Dr. Delfin Edis  . FOOT SURGERY     bone spurs resected, Dr Aida Puffer  . KNEE ARTHROSCOPY  1980   Right  . LAMINECTOMY  2007   X 2, Dr Gladstone Lighter    Family History  Problem Relation Age of Onset  . Diabetes Father   . Hypertension Father   . Heart attack Father 76       precipitated by bleeding ulcer  . Diabetes Mother   . Hypertension Mother   . Coronary artery disease Mother   . Heart attack Paternal Grandfather        ? 67  . Heart attack Maternal Grandfather 68  . Diabetes Maternal Grandmother   . Stroke Maternal Grandmother 82  . Cancer Neg Hx     Social History   Tobacco Use  . Smoking status: Former Smoker    Last attempt to quit: 07/17/1977    Years since quitting: 41.0  . Smokeless tobacco: Never Used  . Tobacco comment: Smoked in [place] ; [phone], up to 1/2 ppd  Substance Use Topics  . Alcohol use: Yes    Comment:  rarely    Subjective:  Started with sore throat/ scratchy throat last night-  Feels like pain is  radiating into right ear; no fever, no congestion; has not taken anything for symptom relief; no concerns for any dental issues;  Is prone to strep throat but notes this does not feel like her normal strep symptoms.     Objective:  Vitals:   08/12/18 1037  BP: 114/70  Pulse: 81  Temp: 98 F (36.7 C)  TempSrc: Oral  SpO2: 97%  Weight: 217 lb 9.6 oz (98.7 kg)  Height: 5' 9"  (1.753 m)    General: Well developed, well nourished, in no acute distress  Skin : Warm and dry.  Head: Normocephalic and atraumatic  Eyes: Sclera and conjunctiva clear; pupils round and reactive to light; extraocular movements intact  Ears: External normal; canals clear; tympanic membranes normal  Oropharynx: Pink, supple. No suspicious lesions  Neck: Supple  without thyromegaly, adenopathy  Lungs: Respirations unlabored; clear to auscultation bilaterally without wheeze, rales, rhonchi  CVS exam: normal rate and regular rhythm.  Neurologic: Alert and oriented; speech intact; face symmetrical; moves all extremities well; CNII-XII intact without focal deficit   Assessment:  1. Sore throat (viral)     Plan:  Physical exam is reassuring; symptomatic treatment discussed; offered strep screen but patient defers; trial of Flonase NS; increase fluids, rest and follow-up worse, no better.   No follow-ups on file.  No orders of the defined types were placed in this encounter.   Requested Prescriptions   Signed Prescriptions Disp Refills  . fluticasone (FLONASE) 50 MCG/ACT nasal spray 16 g 6    Sig: Place 2 sprays into both nostrils daily.

## 2018-08-15 ENCOUNTER — Other Ambulatory Visit: Payer: Self-pay | Admitting: Internal Medicine

## 2018-08-20 ENCOUNTER — Other Ambulatory Visit: Payer: Self-pay | Admitting: Internal Medicine

## 2018-08-20 DIAGNOSIS — I10 Essential (primary) hypertension: Secondary | ICD-10-CM

## 2018-08-22 ENCOUNTER — Other Ambulatory Visit: Payer: Self-pay | Admitting: Internal Medicine

## 2018-08-22 LAB — HM DIABETES EYE EXAM

## 2018-08-31 ENCOUNTER — Other Ambulatory Visit: Payer: Self-pay | Admitting: Internal Medicine

## 2018-09-19 ENCOUNTER — Other Ambulatory Visit: Payer: Self-pay

## 2018-09-19 ENCOUNTER — Other Ambulatory Visit: Payer: Self-pay | Admitting: Internal Medicine

## 2018-09-19 MED ORDER — DULAGLUTIDE 1.5 MG/0.5ML ~~LOC~~ SOAJ: SUBCUTANEOUS | 0 refills | Status: DC

## 2018-09-20 ENCOUNTER — Ambulatory Visit: Payer: 59 | Admitting: Internal Medicine

## 2018-09-30 ENCOUNTER — Telehealth: Payer: Self-pay | Admitting: Internal Medicine

## 2018-09-30 MED ORDER — DULAGLUTIDE 1.5 MG/0.5ML ~~LOC~~ SOAJ: SUBCUTANEOUS | 2 refills | Status: DC

## 2018-09-30 NOTE — Telephone Encounter (Signed)
RX sent

## 2018-09-30 NOTE — Telephone Encounter (Signed)
Patient stated due to insurance change she is needing a new prescription sent in for a 90 day supply of the Trulicty.      [id number]- [place], [place] - [address]

## 2018-10-09 ENCOUNTER — Other Ambulatory Visit: Payer: Self-pay | Admitting: Internal Medicine

## 2018-10-12 ENCOUNTER — Other Ambulatory Visit: Payer: Self-pay | Admitting: Internal Medicine

## 2018-10-27 ENCOUNTER — Other Ambulatory Visit: Payer: Self-pay | Admitting: Internal Medicine

## 2018-11-04 ENCOUNTER — Other Ambulatory Visit: Payer: Self-pay | Admitting: Internal Medicine

## 2018-12-01 ENCOUNTER — Other Ambulatory Visit: Payer: Self-pay | Admitting: Internal Medicine

## 2018-12-19 ENCOUNTER — Other Ambulatory Visit: Payer: Self-pay | Admitting: Internal Medicine

## 2018-12-20 ENCOUNTER — Ambulatory Visit (INDEPENDENT_AMBULATORY_CARE_PROVIDER_SITE_OTHER): Payer: 59 | Admitting: Internal Medicine

## 2018-12-20 ENCOUNTER — Encounter: Payer: Self-pay | Admitting: Internal Medicine

## 2018-12-20 ENCOUNTER — Other Ambulatory Visit: Payer: Self-pay | Admitting: Internal Medicine

## 2018-12-20 DIAGNOSIS — E669 Obesity, unspecified: Secondary | ICD-10-CM | POA: Diagnosis not present

## 2018-12-20 DIAGNOSIS — E1151 Type 2 diabetes mellitus with diabetic peripheral angiopathy without gangrene: Secondary | ICD-10-CM | POA: Diagnosis not present

## 2018-12-20 DIAGNOSIS — E7849 Other hyperlipidemia: Secondary | ICD-10-CM | POA: Diagnosis not present

## 2018-12-20 DIAGNOSIS — I10 Essential (primary) hypertension: Secondary | ICD-10-CM

## 2018-12-20 MED ORDER — DULAGLUTIDE 1.5 MG/0.5ML ~~LOC~~ SOAJ: SUBCUTANEOUS | 3 refills | Status: DC

## 2018-12-20 MED ORDER — METFORMIN HCL 1000 MG PO TABS: 1000.0000 mg | ORAL_TABLET | Freq: Two times a day (BID) | ORAL | 3 refills | Status: DC

## 2018-12-20 MED ORDER — GLIPIZIDE 5 MG PO TABS: ORAL_TABLET | ORAL | 3 refills | Status: DC

## 2018-12-20 NOTE — Patient Instructions (Signed)
Please continue: - Metformin 2000 mg with dinner. - Trulicity 1.5 mg weekly - Glipizide 5 mg before dinner.  Please return in 4 months with your sugar log.

## 2018-12-20 NOTE — Progress Notes (Signed)
Patient ID: [name], female   DOB: 05/20/1956, 63 y.o.   MRN: [id number]  Patient location: Home My location: Office  Referring Provider: Binnie Rail, MD  I connected with the patient on 12/20/18 at  9:18 AM EDT by a video enabled telemedicine application and verified that I am speaking with the correct person.   I discussed the limitations of evaluation and management by telemedicine and the availability of in person appointments. The patient expressed understanding and agreed to proceed.   Details of the encounter are shown below. HPI: [name] is a 63 y.o.-year-old female, presenting for follow-up for DM2, dx in 2000, non-insulin-dependent, uncontrolled, with complications (PVD, CKD, DR). Last visit 63 year ago (!).  She is not usually compliant with the appointments.  Before last visit, in the previous year she lost approximately 20 pounds by improving her diet: Eliminated processed foods and sweets, stopped red and other meats.  However, she gained almost entire weight back and her sugars worsened in the last year.  In last month, sugars are better.   She changed insurances - was almost 1 mo w/o Trulicity - restarted 95/1884.  Last hemoglobin A1c was: Lab Results  Component Value Date   HGBA1C 7.7 (H) 07/05/2018   HGBA1C 5.8 (A) 12/14/2017   HGBA1C 5.9 04/11/2017   Pt is on a regimen of: - Metformin 2000 mg with dinner - Trulicity 1.5 mg weekly  - Glipizide 5 mg before dinner >> added 02/2016 >> stopped 11/2017 >> restarted 12/2017 She was on Farxiga 10 mg daily in am >> stopped 08/2017 after mixup at the pharmacy. She was on Victoza up to 1 year ago when insurance did not cover it. She was on Kombiglyze = Metformin + Onglyza (Saxagliptin) - 1000-2.5 mg daily >> rash  Pt checks her sugars 0-1x a day: - am:  73, 100-120 >> 110-115 >> 110-120, 140 - 2h after b'fast: 221 >> n/c >> 126 >> n/c - before lunch: n/c >> 138 >> n/c - 2h after lunch: 160-180s, 210 >>  160-177 >> 170-175 - before dinner: 208 >> n/c >> 120-128 >> n/c - 2h after dinner: n/c - bedtime: n/c - nighttime: n/c Lowest sugar was low 70s x2 >> 80s >> 110; she has hypoglycemia awareness in the 80s. Highest sugar was 240 x1 >> 130 >> 130 >> 200.  Glucometer: One Touch Ultra  -+ CKD, last BUN/creatinine:  Lab Results  Component Value Date   BUN 26 (H) 07/05/2018   CREATININE 1.12 07/05/2018   Lab Results  Component Value Date   GFRAA 85 04/22/2008   GFRAA 85 11/22/2006   Lab Results  Component Value Date   MICRALBCREAT 0.7 06/07/2015   MICRALBCREAT 0.8 02/03/2015   MICRALBCREAT 0.5 06/05/2014   MICRALBCREAT 0.4 04/05/2012   MICRALBCREAT 0.6 11/01/2010   MICRALBCREAT 0.4 02/14/2010   MICRALBCREAT 5.1 04/22/2008   MICRALBCREAT 3.0 11/22/2006  On benazepril.  -+ HL. Last set of lipids: Lab Results  Component Value Date   CHOL 176 07/05/2018   HDL 56.90 07/05/2018   LDLCALC 101 (H) 07/05/2018   LDLDIRECT 149.9 02/14/2010   TRIG 94.0 07/05/2018   CHOLHDL 3 07/05/2018  On Lipitor 40 and Zetia 10. - last eye exam was in 08/2018: + "bleed in L eye"  - + numbness and tingling in her feet after her back surgery  She also has HTN, Had back surgery 2007. Back pain increased 02/2015 >> saw Dr Tamala Julian >> started Gabapentin >>  disequilibrium. She was on steroids then >> sugars higher. In 03/2015: spine steroid inj.  ROS: Constitutional: no weight gain/no weight loss, no fatigue, no subjective hyperthermia, no subjective hypothermia Eyes: no blurry vision, no xerophthalmia ENT: no sore throat, no nodules palpated in neck, no dysphagia, no odynophagia, no hoarseness Cardiovascular: no CP/no SOB/no palpitations/no leg swelling Respiratory: no cough/no SOB/no wheezing Gastrointestinal: no N/no V/no D/no C/no acid reflux Musculoskeletal: no muscle aches/no joint aches Skin: no rashes, no hair loss Neurological: no tremors/+ numbness/+ tingling/no dizziness  I reviewed pt's  medications, allergies, PMH, social hx, family hx, and changes were documented in the history of present illness. Otherwise, unchanged from my initial visit note.   Past Medical History:  Diagnosis Date  . Colitis    in her 31s; quiescent since  . DM (diabetes mellitus) ([place])   . HTN (hypertension)   . Hyperlipidemia    Past Surgical History:  Procedure Laterality Date  . CERVIX SURGERY  2001   Cryosurgery, Dr Connye Burkitt  . COLONOSCOPY  2009   Negative, Dr. Delfin Edis  . FOOT SURGERY     bone spurs resected, Dr Aida Puffer  . KNEE ARTHROSCOPY  1980   Right  . LAMINECTOMY  2007   X 2, Dr Gladstone Lighter   Social History   Social History  . Marital Status: Divorced    Spouse Name: N/A  . Number of Children: 1   Occupational History  . 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 Smoking status: Former Smoker    Quit date: 1996  . Alcohol Use: Yes     Comment:  rarely  . Drug Use: No   Current Outpatient Medications on File Prior to Visit  Medication Sig Dispense Refill  . atorvastatin (LIPITOR) 40 MG tablet TAKE ONE TABLET BY MOUTH DAILY AT 6PM 90 tablet 3  . benazepril (LOTENSIN) 40 MG tablet Take 1 tablet (40 mg total) by mouth daily. 90 tablet 2  . Dulaglutide (TRULICITY) 1.5 IR/5.1OA SOPN INJECT 1.5 MG UNDER THE SKIN ONCE WEEKLY 16 pen 2  . ezetimibe (ZETIA) 10 MG tablet TAKE ONE TABLET BY MOUTH DAILY 30 tablet 4  . fluticasone (FLONASE) 50 MCG/ACT nasal spray Place 2 sprays into both nostrils daily. 16 g 6  . glipiZIDE (GLUCOTROL) 5 MG tablet TAKE ONE TABLET BY MOUTH DAILY 15-30 MINUTES BEFORE DINNER 30 tablet 2  . glucose blood (ONETOUCH VERIO) test strip Use to check blood sugar 1 time per day. Dx Code E11.9 100 each 12  . hydrochlorothiazide (HYDRODIURIL) 25 MG tablet TAKE ONE-HALF TABLET BY MOUTH DAILY 15 tablet 1  . hyoscyamine (LEVSIN SL) 0.125 MG SL tablet Place 1 tablet (0.125 mg total) under the tongue every 4 (four) hours as needed. 30 tablet 0  .  metFORMIN (GLUCOPHAGE) 1000 MG tablet TAKE ONE TABLET BY MOUTH TWO TIMES A DAY 60 tablet 0  . metoprolol tartrate (LOPRESSOR) 50 MG tablet TAKE 1/2 TABLET BY MOUTH TWO TIMES A DAY 30 tablet 3  . [DISCONTINUED] Saxagliptin-Metformin (KOMBIGLYZE XR) 2.11-998 MG TB24 2 daily 30 tablet 2   No current facility-administered medications on file prior to visit.    Allergies  Allergen Reactions  . Atorvastatin     REACTION: ELEVATES LFT'S. Pt states she is currently taking Atorvastatin 40 mg (1/2 tablet daily) and does not recall hab=ving any issues with it. (07/14/14)  . Codeine Nausea Only       . Neomycin-Bacitracin Zn-Polymyx Rash  . Onglyza [  Saxagliptin Hydrochloride] Rash    This occurred after starting Kombiglyze . This was present as a rash over the hands and feet dorsally. She has been able to take metformin in the past as well as Janumet without problems.  . Sulfamethoxazole-Trimethoprim Nausea And Vomiting     Nausea was extreme.     Family History  Problem Relation Age of Onset  . Diabetes Father   . Hypertension Father   . Heart attack Father 7       precipitated by bleeding ulcer  . Diabetes Mother   . Hypertension Mother   . Coronary artery disease Mother   . Heart attack Paternal Grandfather        ? 42  . Heart attack Maternal Grandfather 68  . Diabetes Maternal Grandmother   . Stroke Maternal Grandmother 82  . Cancer Neg Hx    PE: There were no vitals taken for this visit. There is no height or weight on file to calculate BMI.  Wt Readings from Last 3 Encounters:  08/12/18 217 lb 9.6 oz (98.7 kg)  07/05/18 213 lb (96.6 kg)  12/14/17 202 lb (91.6 kg)   Constitutional:  in NAD  The physical exam was not performed (virtual visit).  ASSESSMENT: 1. DM2, non-insulin-dependent, uncontrolled, with complications - PVD - CKD - DR  2. HL  3. Obesity  PLAN:  1. Patient with longstanding, uncontrolled diabetes, with greatly improved control after starting to  improve her diet and be compliant with her diabetic medications.  She now returns after 1 year absence. At last visit, HbA1c was excellent, at 5.8% and we were able to stop glipizide.  We continued metformin and a weekly GLP-1 receptor agonist.  She was previously on Farxiga but at last visit her sugars were not changed while off the medication so we did not restart it. -Since last visit, sugars started to increase and she added back glipizide.  She takes this before dinner. -At this visit, sugars are still at goal in the morning and they are slightly high, but still within target after lunch.  I advised her to check some sugars at different times of the day, also, but for now, I do not feel we need to change the regimen. - I suggested to:  Patient Instructions  Please continue: - Metformin 2000 mg with dinner. - Trulicity 1.5 mg weekly - Glipizide 5 mg before dinner.  Please return in 4 months with your sugar log.  - we would check her HbA1c when she returns to the clinic - continue checking sugars at different times of the day - check 1x a day, rotating checks - advised for yearly eye exams >> she is UTD - Return to clinic in 4 mo with sugar log   2. HL - Reviewed latest lipid panel from 06/2018: LDL above goal, the rest of the fractions at goal Lab Results  Component Value Date   CHOL 176 07/05/2018   HDL 56.90 07/05/2018   LDLCALC 101 (H) 07/05/2018   LDLDIRECT 149.9 02/14/2010   TRIG 94.0 07/05/2018   CHOLHDL 3 07/05/2018  - Continues the statin and ezetimibe without side effects.  3. Obesity - gained 7-8 lbs OV 2/2 sedentarism (working from home) since the coronavirus pandemic started -Continue Trulicity which should also help with weight loss   Philemon Kingdom, MD PhD [facility] Endocrinology

## 2018-12-23 ENCOUNTER — Other Ambulatory Visit: Payer: Self-pay | Admitting: Internal Medicine

## 2018-12-23 NOTE — Telephone Encounter (Signed)
Okay to refill? 

## 2018-12-23 NOTE — Telephone Encounter (Signed)
Please advise on refilling this.

## 2018-12-24 ENCOUNTER — Other Ambulatory Visit: Payer: Self-pay | Admitting: Internal Medicine

## 2019-01-02 ENCOUNTER — Other Ambulatory Visit: Payer: Self-pay | Admitting: Internal Medicine

## 2019-01-03 ENCOUNTER — Ambulatory Visit: Payer: 59 | Admitting: Internal Medicine

## 2019-01-15 ENCOUNTER — Other Ambulatory Visit: Payer: Self-pay | Admitting: Pediatric Intensive Care

## 2019-01-15 DIAGNOSIS — Z20822 Contact with and (suspected) exposure to covid-19: Secondary | ICD-10-CM

## 2019-01-19 ENCOUNTER — Other Ambulatory Visit: Payer: Self-pay | Admitting: Internal Medicine

## 2019-01-19 DIAGNOSIS — I10 Essential (primary) hypertension: Secondary | ICD-10-CM

## 2019-01-23 LAB — NOVEL CORONAVIRUS, NAA: SARS-CoV-2, NAA: NOT DETECTED

## 2019-01-27 ENCOUNTER — Other Ambulatory Visit: Payer: Self-pay | Admitting: Internal Medicine

## 2019-01-27 DIAGNOSIS — I10 Essential (primary) hypertension: Secondary | ICD-10-CM

## 2019-02-03 ENCOUNTER — Other Ambulatory Visit: Payer: Self-pay | Admitting: Internal Medicine

## 2019-02-07 ENCOUNTER — Other Ambulatory Visit: Payer: Self-pay | Admitting: Internal Medicine

## 2019-02-17 NOTE — Progress Notes (Deleted)
Subjective:    Patient ID: [name], female    DOB: 1956-07-01, 62 y.o.   MRN: [id number]  HPI The patient is here for follow up.  She is exercising regularly.     Diabetes: She is taking her medication daily as prescribed. She is compliant with a diabetic diet.  She monitors her sugars and they have been running XXX. She denies numbness/tingling in her feet and foot lesions. She is up-to-date with an ophthalmology examination.   Hypertension: She is taking her medication daily. She is compliant with a low sodium diet.  She denies chest pain, palpitations, edema, shortness of breath and regular headaches. She does not monitor her blood pressure at home.    Hyperlipidemia: She is taking her medication daily. She is compliant with a low fat/cholesterol diet. She denies myalgias.        Medications and allergies reviewed with patient and updated if appropriate.  Patient Active Problem List   Diagnosis Date Noted  . Fatty liver 07/16/2016  . Hair thinning 07/03/2016  . Lumbar back pain with radiculopathy affecting left lower extremity 02/22/2015  . PVC's (premature ventricular contractions) 06/09/2014  . Essential hypertension 02/14/2010  . Type 2 diabetes mellitus with peripheral vascular disease ([place]) 11/21/2006  . Hyperlipidemia 11/21/2006    Current Outpatient Medications on File Prior to Visit  Medication Sig Dispense Refill  . atorvastatin (LIPITOR) 40 MG tablet TAKE ONE TABLET BY MOUTH DAILY AT 6PM 90 tablet 3  . benazepril (LOTENSIN) 40 MG tablet TAKE ONE TABLET BY MOUTH DAILY 30 tablet 1  . Dulaglutide (TRULICITY) 1.5 QI/2.9NL SOPN INJECT 1.5 MG UNDER THE SKIN ONCE WEEKLY 12 pen 3  . ezetimibe (ZETIA) 10 MG tablet TAKE 1 TABLET BY MOUTH DAILY 30 tablet 0  . fluticasone (FLONASE) 50 MCG/ACT nasal spray Place 2 sprays into both nostrils daily. 16 g 6  . glipiZIDE (GLUCOTROL) 5 MG tablet TAKE ONE TABLET BY MOUTH DAILY 15-30 MINUTES BEFORE DINNER 30 tablet 1  .  glucose blood (ONETOUCH VERIO) test strip Use to check blood sugar 1 time per day. Dx Code E11.9 100 each 12  . hydrochlorothiazide (HYDRODIURIL) 25 MG tablet Take 0.5 tablets (12.5 mg total) by mouth daily. Need office visit for more refills 15 tablet 0  . hyoscyamine (LEVSIN SL) 0.125 MG SL tablet Place 1 tablet (0.125 mg total) under the tongue every 4 (four) hours as needed. 30 tablet 0  . metFORMIN (GLUCOPHAGE) 1000 MG tablet TAKE ONE TABLET BY MOUTH TWICE A DAY 60 tablet 0  . metoprolol tartrate (LOPRESSOR) 50 MG tablet Take 0.5 tablets (25 mg total) by mouth 2 (two) times daily. Need office visit for more refills. 30 tablet 0  . [DISCONTINUED] Saxagliptin-Metformin (KOMBIGLYZE XR) 2.11-998 MG TB24 2 daily 30 tablet 2   No current facility-administered medications on file prior to visit.     Past Medical History:  Diagnosis Date  . Colitis    in her 20s; quiescent since  . DM (diabetes mellitus) ([place])   . HTN (hypertension)   . Hyperlipidemia     Past Surgical History:  Procedure Laterality Date  . CERVIX SURGERY  2001   Cryosurgery, Dr Connye Burkitt  . COLONOSCOPY  2009   Negative, Dr. Delfin Edis  . FOOT SURGERY     bone spurs resected, Dr Aida Puffer  . KNEE ARTHROSCOPY  1980   Right  . LAMINECTOMY  2007   X 2, Dr Gladstone Lighter    Social History  Socioeconomic History  . Marital status: Divorced    Spouse name: Not on file  . Number of children: Not on file  . Years of education: Not on file  . Highest education level: Not on file  Occupational History  . Not on file  Social Needs  . Financial resource strain: Not on file  . Food insecurity    Worry: Not on file    Inability: Not on file  . Transportation needs    Medical: Not on file    Non-medical: Not on file  Tobacco Use  . Smoking status: Former Smoker    Quit date: 07/17/1977    Years since quitting: 41.6  . Smokeless tobacco: Never Used  . Tobacco comment: Smoked in [place] ; [phone], up to 1/2 ppd   Substance and Sexual Activity  . Alcohol use: Yes    Comment:  rarely  . Drug use: No  . Sexual activity: Not on file  Lifestyle  . Physical activity    Days per week: Not on file    Minutes per session: Not on file  . Stress: Not on file  Relationships  . Social Herbalist on phone: Not on file    Gets together: Not on file    Attends religious service: Not on file    Active member of club or organization: Not on file    Attends meetings of clubs or organizations: Not on file    Relationship status: Not on file  Other Topics Concern  . Not on file  Social History Narrative  . Not on file    Family History  Problem Relation Age of Onset  . Diabetes Father   . Hypertension Father   . Heart attack Father 6       precipitated by bleeding ulcer  . Diabetes Mother   . Hypertension Mother   . Coronary artery disease Mother   . Heart attack Paternal Grandfather        ? 54  . Heart attack Maternal Grandfather 68  . Diabetes Maternal Grandmother   . Stroke Maternal Grandmother 82  . Cancer Neg Hx     Review of Systems     Objective:  There were no vitals filed for this visit. BP Readings from Last 3 Encounters:  08/12/18 114/70  07/05/18 112/68  12/14/17 112/68   Wt Readings from Last 3 Encounters:  08/12/18 217 lb 9.6 oz (98.7 kg)  07/05/18 213 lb (96.6 kg)  12/14/17 202 lb (91.6 kg)   There is no height or weight on file to calculate BMI.   Physical Exam    Constitutional: Appears well-developed and well-nourished. No distress.  HENT:  Head: Normocephalic and atraumatic.  Neck: Neck supple. No tracheal deviation present. No thyromegaly present.  No cervical lymphadenopathy Cardiovascular: Normal rate, regular rhythm and normal heart sounds.   No murmur heard. No carotid bruit .  No edema Pulmonary/Chest: Effort normal and breath sounds normal. No respiratory distress. No has no wheezes. No rales.  Skin: Skin is warm and dry. Not diaphoretic.   Psychiatric: Normal mood and affect. Behavior is normal.      Assessment & Plan:    See Problem List for Assessment and Plan of chronic medical problems.

## 2019-02-18 ENCOUNTER — Encounter: Payer: Self-pay | Admitting: Internal Medicine

## 2019-02-18 ENCOUNTER — Ambulatory Visit (INDEPENDENT_AMBULATORY_CARE_PROVIDER_SITE_OTHER): Payer: 59 | Admitting: Internal Medicine

## 2019-02-18 DIAGNOSIS — E1151 Type 2 diabetes mellitus with diabetic peripheral angiopathy without gangrene: Secondary | ICD-10-CM

## 2019-02-18 DIAGNOSIS — E7849 Other hyperlipidemia: Secondary | ICD-10-CM | POA: Diagnosis not present

## 2019-02-18 DIAGNOSIS — I10 Essential (primary) hypertension: Secondary | ICD-10-CM | POA: Diagnosis not present

## 2019-02-18 NOTE — Assessment & Plan Note (Signed)
Management per Dr Cruzita Lederer Will get a1c in October Exercising, eating better Sugars better controlled

## 2019-02-18 NOTE — Progress Notes (Signed)
Virtual Visit via Video Note  I connected with [name] on 02/18/19 at  1:45 PM EDT by a video enabled telemedicine application and verified that I am speaking with the correct person using two identifiers.   I discussed the limitations of evaluation and management by telemedicine and the availability of in person appointments. The patient expressed understanding and agreed to proceed.  The patient is currently at home and I am in the office.    No referring provider.    History of Present Illness: The patient is here for follow up.  She is exercising regularly - walking dog, yoga.     Diabetes: she is following with Dr Cruzita Lederer.  She is taking her medication daily as prescribed. She is compliant with a diabetic diet.  She monitors her sugars and they have been running 120, 130, 140.   Hypertension: She is taking her medication daily. She is compliant with a low sodium diet.  She denies chest pain, palpitations, edema, shortness of breath and regular headaches.     Hyperlipidemia: She is taking her medication daily. She is compliant with a low fat/cholesterol diet. She denies myalgias.    Review of Systems  Constitutional: Negative for chills and fever.  Respiratory: Negative for cough, shortness of breath and wheezing.   Cardiovascular: Negative for chest pain, palpitations and leg swelling.  Neurological: Negative for dizziness and headaches.     Social History   Socioeconomic History  . Marital status: Divorced    Spouse name: Not on file  . Number of children: Not on file  . Years of education: Not on file  . Highest education level: Not on file  Occupational History  . Not on file  Social Needs  . Financial resource strain: Not on file  . Food insecurity    Worry: Not on file    Inability: Not on file  . Transportation needs    Medical: Not on file    Non-medical: Not on file  Tobacco Use  . Smoking status: Former Smoker    Quit date: 07/17/1977    Years  since quitting: 41.6  . Smokeless tobacco: Never Used  . Tobacco comment: Smoked in [place] ; [phone], up to 1/2 ppd  Substance and Sexual Activity  . Alcohol use: Yes    Comment:  rarely  . Drug use: No  . Sexual activity: Not on file  Lifestyle  . Physical activity    Days per week: Not on file    Minutes per session: Not on file  . Stress: Not on file  Relationships  . Social Herbalist on phone: Not on file    Gets together: Not on file    Attends religious service: Not on file    Active member of club or organization: Not on file    Attends meetings of clubs or organizations: Not on file    Relationship status: Not on file  Other Topics Concern  . Not on file  Social History Narrative  . Not on file     Observations/Objective: Appears well in NAD   Assessment and Plan:  See Problem List for Assessment and Plan of chronic medical problems.   Follow Up Instructions:    I discussed the assessment and treatment plan with the patient. The patient was provided an opportunity to ask questions and all were answered. The patient agreed with the plan and demonstrated an understanding of the instructions.   The patient was  advised to call back or seek an in-person evaluation if the symptoms worsen or if the condition fails to improve as anticipated.  Blood work deferred.  Follow up in 6 months  Binnie Rail, MD

## 2019-02-18 NOTE — Assessment & Plan Note (Signed)
Continue statin. 

## 2019-02-18 NOTE — Assessment & Plan Note (Signed)
BP Readings from Last 3 Encounters:  08/12/18 114/70  07/05/18 112/68  12/14/17 112/68   BP well controlled Current regimen effective and well tolerated Continue current medications at current doses

## 2019-02-24 ENCOUNTER — Other Ambulatory Visit: Payer: Self-pay | Admitting: Internal Medicine

## 2019-02-26 ENCOUNTER — Other Ambulatory Visit: Payer: Self-pay | Admitting: Internal Medicine

## 2019-02-26 DIAGNOSIS — I10 Essential (primary) hypertension: Secondary | ICD-10-CM

## 2019-02-28 ENCOUNTER — Telehealth: Payer: Self-pay | Admitting: Internal Medicine

## 2019-02-28 NOTE — Telephone Encounter (Signed)
Rec'd from [facility] forwarded 2 pages to Dr. Billey Gosling

## 2019-03-06 ENCOUNTER — Other Ambulatory Visit: Payer: Self-pay | Admitting: Internal Medicine

## 2019-03-25 ENCOUNTER — Other Ambulatory Visit: Payer: Self-pay | Admitting: Internal Medicine

## 2019-04-03 ENCOUNTER — Other Ambulatory Visit: Payer: Self-pay | Admitting: Internal Medicine

## 2019-04-09 ENCOUNTER — Telehealth: Payer: Self-pay

## 2019-04-09 NOTE — Telephone Encounter (Signed)
LVM to Schedule

## 2019-04-09 NOTE — Telephone Encounter (Signed)
Received pre-op clearance form for knee replacement.   Per requirements patient must have A1c lower than 7.5.  We do not have a current A1c under that level so Dr. Cruzita Lederer would like patient to have a nurse visit for A1c check.  Please contact patient for nurse visit. Thank you.

## 2019-04-11 NOTE — Telephone Encounter (Signed)
[name]  You 26 minutes ago (1:44 PM)   Patient stated she is not doing the surgery anytime soon. She will inform us when she does.

## 2019-04-14 NOTE — Telephone Encounter (Signed)
Noted  

## 2019-04-23 ENCOUNTER — Ambulatory Visit: Payer: 59 | Admitting: Internal Medicine

## 2019-04-24 ENCOUNTER — Other Ambulatory Visit: Payer: Self-pay | Admitting: Internal Medicine

## 2019-05-31 ENCOUNTER — Other Ambulatory Visit: Payer: Self-pay | Admitting: Internal Medicine

## 2019-06-06 ENCOUNTER — Other Ambulatory Visit: Payer: Self-pay

## 2019-06-10 ENCOUNTER — Ambulatory Visit (INDEPENDENT_AMBULATORY_CARE_PROVIDER_SITE_OTHER): Payer: 59 | Admitting: Internal Medicine

## 2019-06-10 ENCOUNTER — Encounter: Payer: Self-pay | Admitting: Internal Medicine

## 2019-06-10 VITALS — BP 120/62 | HR 80 | Ht 69.0 in | Wt 212.0 lb

## 2019-06-10 DIAGNOSIS — E7849 Other hyperlipidemia: Secondary | ICD-10-CM | POA: Diagnosis not present

## 2019-06-10 DIAGNOSIS — E1151 Type 2 diabetes mellitus with diabetic peripheral angiopathy without gangrene: Secondary | ICD-10-CM | POA: Diagnosis not present

## 2019-06-10 DIAGNOSIS — E669 Obesity, unspecified: Secondary | ICD-10-CM | POA: Diagnosis not present

## 2019-06-10 LAB — POCT GLYCOSYLATED HEMOGLOBIN (HGB A1C): Hemoglobin A1C: 7.3 % — AB (ref 4.0–5.6)

## 2019-06-10 MED ORDER — TRULICITY 3 MG/0.5ML ~~LOC~~ SOAJ: 3.0000 mg | SUBCUTANEOUS | 3 refills | Status: DC

## 2019-06-10 NOTE — Patient Instructions (Signed)
Please continue: - Metformin 2000 mg with dinner - Glipizide 5 mg before dinner  Please increase: - Trulicity to 3 mg weekly  Please return in 4 months with your sugar log.

## 2019-06-10 NOTE — Addendum Note (Signed)
Addended by: Cardell Peach I on: 06/10/2019 11:32 AM   Modules accepted: Orders

## 2019-06-10 NOTE — Progress Notes (Signed)
Patient ID: [name], female   DOB: [birth year], 63 y.o.   MRN: [id number]  This visit occurred during the SARS-CoV-2 public health emergency.  Safety protocols were in place, including screening questions prior to the visit, additional usage of staff PPE, and extensive cleaning of exam room while observing appropriate contact time as indicated for disinfecting solutions.   HPI: [name] is a 63 y.o.-year-old female, presenting for follow-up for DM2, dx in 2000, non-insulin-dependent, uncontrolled, with complications (PVD, CKD, DR).  Last visit 5 months ago - virtual.  She tore tendons in her right shoulder after a fall in her kitchen in 02/2019. She also fell on the other shoulder 1 mo later.  Pain just recently started to resolve.  In the past, she lost approximately 20 pounds by improving her diet: Eliminated processed foods and sweets, stopped red and other meats.  However, she gained almost entirely back in 2019-2020.  Reviewed HbA1c levels: Lab Results  Component Value Date   HGBA1C 7.7 (H) 07/05/2018   HGBA1C 5.8 (A) 12/14/2017   HGBA1C 5.9 04/11/2017   Pt is on a regimen of: - Metformin 2000 mg with dinner - Glipizide 5 mg before dinner - Trulicity 1.5 mg weekly She was on Farxiga 10 mg daily in am >> stopped 08/2017 after mixup at the pharmacy. She was on Victoza up to 1 year ago when insurance did not cover it. She was on Kombiglyze = Metformin + Onglyza (Saxagliptin) - 1000-2.5 mg daily >> rash.  Pt checks her sugars 0 to once a day: - am:  110-115 >> 110-120, 140 >> 92, 108-161 - 2h after b'fast: 221 >> n/c >> 126 >> n/c >> 130, 218 - before lunch: n/c >> 138 >> n/c - 2h after lunch: 160-180s, 210 >> 160-177 >> 170-175 >>n/c - before dinner: 208 >> n/c >> 120-128 >> n/c - 2h after dinner: n/c - bedtime: n/c - nighttime: n/c Lowest sugar was low 70s x2 >> 80s >> 110 >> 92; she has hypoglycemia awareness in the 80s. Highest sugar was 200 >> 218.  Glucometer:  One Touch Ultra  -+ Mild CKD, last BUN/creatinine:  Lab Results  Component Value Date   BUN 26 (H) 07/05/2018   CREATININE 1.12 07/05/2018   Lab Results  Component Value Date   GFRAA 85 04/22/2008   GFRAA 85 11/22/2006   Lab Results  Component Value Date   MICRALBCREAT 0.7 06/07/2015   MICRALBCREAT 0.8 02/03/2015   MICRALBCREAT 0.5 06/05/2014   MICRALBCREAT 0.4 04/05/2012   MICRALBCREAT 0.6 11/01/2010   MICRALBCREAT 0.4 02/14/2010   MICRALBCREAT 5.1 04/22/2008   MICRALBCREAT 3.0 11/22/2006  On benazepril.  -+ HL. Last set of lipids: Lab Results  Component Value Date   CHOL 176 07/05/2018   HDL 56.90 07/05/2018   LDLCALC 101 (H) 07/05/2018   LDLDIRECT 149.9 02/14/2010   TRIG 94.0 07/05/2018   CHOLHDL 3 07/05/2018  On Lipitor 40 and Zetia 10. - last eye exam was in 08/2018: + hemorrhage in the left eye - + numbness and tingling in her feet after her back surgery  She also has HTN, Had back surgery 2007. Back pain increased 02/2015 >> saw Dr Tamala Julian >> started Gabapentin >> disequilibrium. She was on steroids then >> sugars higher. In 03/2015: spine steroid inj.  This Thanksgiving, she will cook dinner for 30 families in need.  ROS: Constitutional: no weight gain/no weight loss, no fatigue, no subjective hyperthermia, no subjective hypothermia Eyes: no blurry vision, no  xerophthalmia ENT: no sore throat, no nodules palpated in neck, no dysphagia, no odynophagia, no hoarseness Cardiovascular: no CP/no SOB/no palpitations/no leg swelling Respiratory: no cough/no SOB/no wheezing Gastrointestinal: no N/no V/no D/no C/no acid reflux Musculoskeletal: no muscle aches/no joint aches Skin: no rashes, no hair loss Neurological: no tremors/+ numbness/+ tingling/no dizziness  I reviewed pt's medications, allergies, PMH, social hx, family hx, and changes were documented in the history of present illness. Otherwise, unchanged from my initial visit note.   Past Medical History:   Diagnosis Date  . Colitis    in her 23s; quiescent since  . DM (diabetes mellitus) ([place])   . HTN (hypertension)   . Hyperlipidemia    Past Surgical History:  Procedure Laterality Date  . CERVIX SURGERY  2001   Cryosurgery, Dr Connye Burkitt  . COLONOSCOPY  2009   Negative, Dr. Delfin Edis  . FOOT SURGERY     bone spurs resected, Dr Aida Puffer  . KNEE ARTHROSCOPY  1980   Right  . LAMINECTOMY  2007   X 2, Dr Gladstone Lighter   Social History   Social History  . Marital Status: Divorced    Spouse Name: N/A  . Number of Children: 1   Occupational History  . 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 Smoking status: Former Smoker    Quit date: 1996  . Alcohol Use: Yes     Comment:  rarely  . Drug Use: No   Current Outpatient Medications on File Prior to Visit  Medication Sig Dispense Refill  . atorvastatin (LIPITOR) 40 MG tablet TAKE ONE TABLET BY MOUTH DAILY AT 6PM 90 tablet 3  . benazepril (LOTENSIN) 40 MG tablet TAKE ONE TABLET BY MOUTH DAILY 30 tablet 1  . Dulaglutide (TRULICITY) 1.5 HU/3.1SH SOPN INJECT 1.5 MG UNDER THE SKIN ONCE WEEKLY 12 pen 3  . ezetimibe (ZETIA) 10 MG tablet TAKE ONE TABLET BY MOUTH DAILY 90 tablet 1  . fluticasone (FLONASE) 50 MCG/ACT nasal spray Place 2 sprays into both nostrils daily. 16 g 6  . glipiZIDE (GLUCOTROL) 5 MG tablet TAKE ONE TABLET BY MOUTH DAILY 15-30 MINUTES BEFORE DINNER 30 tablet 1  . glucose blood (ONETOUCH VERIO) test strip Use to check blood sugar 1 time per day. Dx Code E11.9 100 each 12  . hydrochlorothiazide (HYDRODIURIL) 25 MG tablet TAKE 1/2 TABLET BY MOUTH BY MOUTH DAILY 15 tablet 5  . hyoscyamine (LEVSIN SL) 0.125 MG SL tablet Place 1 tablet (0.125 mg total) under the tongue every 4 (four) hours as needed. 30 tablet 0  . metFORMIN (GLUCOPHAGE) 1000 MG tablet TAKE ONE TABLET BY MOUTH TWICE A DAY 60 tablet 0  . metoprolol tartrate (LOPRESSOR) 50 MG tablet TAKE 1/2 TABLET BY MOUTH TWICE A DAY 30 tablet 5  .  [DISCONTINUED] Saxagliptin-Metformin (KOMBIGLYZE XR) 2.11-998 MG TB24 2 daily 30 tablet 2   No current facility-administered medications on file prior to visit.    Allergies  Allergen Reactions  . Atorvastatin     REACTION: ELEVATES LFT'S. Pt states she is currently taking Atorvastatin 40 mg (1/2 tablet daily) and does not recall hab=ving any issues with it. (07/14/14)  . Codeine Nausea Only       . Neomycin-Bacitracin Zn-Polymyx Rash  . Onglyza [Saxagliptin Hydrochloride] Rash    This occurred after starting Kombiglyze . This was present as a rash over the hands and feet dorsally. She has been able to take metformin in the past as well as Janumet  without problems.  . Sulfamethoxazole-Trimethoprim Nausea And Vomiting     Nausea was extreme.     Family History  Problem Relation Age of Onset  . Diabetes Father   . Hypertension Father   . Heart attack Father 52       precipitated by bleeding ulcer  . Diabetes Mother   . Hypertension Mother   . Coronary artery disease Mother   . Heart attack Paternal Grandfather        ? 4  . Heart attack Maternal Grandfather 68  . Diabetes Maternal Grandmother   . Stroke Maternal Grandmother 82  . Cancer Neg Hx    PE: BP 120/62   Ht 5' 9"  (1.753 m)   BMI 32.13 kg/m  Body mass index is 32.13 kg/m.  Wt Readings from Last 3 Encounters:  08/12/18 217 lb 9.6 oz (98.7 kg)  07/05/18 213 lb (96.6 kg)  12/14/17 202 lb (91.6 kg)   Constitutional: overweight, in NAD Eyes: PERRLA, EOMI, no exophthalmos ENT: moist mucous membranes, no thyromegaly, no cervical lymphadenopathy Cardiovascular: RRR, No MRG Respiratory: CTA B Gastrointestinal: abdomen soft, NT, ND, BS+ Musculoskeletal: no deformities, strength intact in all 4 Skin: moist, warm, no rashes Neurological: no tremor with outstretched hands, DTR normal in all 4  ASSESSMENT: 1. DM2, non-insulin-dependent, uncontrolled, with complications - PVD - CKD - DR  2. HL  3.  Obesity  PLAN:  1. Patient with longstanding, uncontrolled, type 2 diabetes, with greatly improved control after starting to improve her diet and be compliant with her diabetic medications.  At last visit (which was virtual, she was telling me that she had to add back glipizide (before dinner) which we originally stopped due to good control, as her sugars started to increase.  At that time, sugars were still at goal in the morning but they are slightly higher after lunch.  We discussed about checking sugars at different times of the day but we did not change the regimen at that time. -At this visit, we reviewed together the meter downloads and it appears that her sugars are still above target in the morning and they are still high later in the day.  She tells me that she snacks frequently between meals and we discussed about the need to stop these and allow her insulin levels to drop between meals.  I also suggested to increase the Trulicity dose to the newly FDA approved dose of 3 mg weekly.  She tolerates Trulicity well and she agrees with this change.  Otherwise, we will continue Metformin and glipizide at the same doses. - I suggested to:  Patient Instructions  Please continue: - Metformin 2000 mg with dinner - Glipizide 5 mg before dinner  Please increase: - Trulicity to 3 mg weekly  Please return in 4 months with your sugar log.  - we checked her HbA1c: 7.3% (lower) - advised to check sugars at different times of the day - 1x a day, rotating check times - advised for yearly eye exams >> she is UTD - UTD with flu shot and pneumonia shot - return to clinic in 4 months   2. HL -Reviewed latest lipid panel from 06/2018: LDL only slightly above goal, the rest of the fractions at goal Lab Results  Component Value Date   CHOL 176 07/05/2018   HDL 56.90 07/05/2018   LDLCALC 101 (H) 07/05/2018   LDLDIRECT 149.9 02/14/2010   TRIG 94.0 07/05/2018   CHOLHDL 3 07/05/2018  -Continues the  statin and  ezetimibe without side effects.  3. Obesity -She gained 7 to 8 pounds after starting to work from home due to the coronavirus pandemic -Weight increased by 4 pounds since last in person -We will increase Trulicity which should also help with weight loss  Philemon Kingdom, MD PhD [facility] Endocrinology

## 2019-06-20 ENCOUNTER — Other Ambulatory Visit: Payer: Self-pay | Admitting: Internal Medicine

## 2019-06-20 NOTE — Telephone Encounter (Signed)
Per PCP

## 2019-06-20 NOTE — Telephone Encounter (Signed)
Please advise if you fill this.

## 2019-07-07 NOTE — Progress Notes (Signed)
Subjective:    Patient ID: [name], female    DOB: July 31, 1955, 63 y.o.   MRN: [id number]  HPI She is here for a physical exam.    She denies changes in her history since she was here last.  She did injure  her shoulder and is eligible for a reverse total shoulder replacement. She plans on holding off on surgery for now.  She feels she can tolerate her pain at this point.   Medications and allergies reviewed with patient and updated if appropriate.  Patient Active Problem List   Diagnosis Date Noted  . Fatty liver 07/16/2016  . Hair thinning 07/03/2016  . Lumbar back pain with radiculopathy affecting left lower extremity 02/22/2015  . PVC's (premature ventricular contractions) 06/09/2014  . Essential hypertension 02/14/2010  . Type 2 diabetes mellitus with peripheral vascular disease ([place]) 11/21/2006  . Hyperlipidemia 11/21/2006    Current Outpatient Medications on File Prior to Visit  Medication Sig Dispense Refill  . atorvastatin (LIPITOR) 40 MG tablet TAKE ONE TABLET BY MOUTH DAILY AT 6PM 90 tablet 3  . benazepril (LOTENSIN) 40 MG tablet TAKE ONE TABLET BY MOUTH DAILY 30 tablet 1  . Dulaglutide (TRULICITY) 3 HT/9.7FS SOPN Inject 3 mg into the skin once a week. 12 pen 3  . ezetimibe (ZETIA) 10 MG tablet TAKE ONE TABLET BY MOUTH DAILY 90 tablet 1  . fluticasone (FLONASE) 50 MCG/ACT nasal spray Place 2 sprays into both nostrils daily. 16 g 6  . glipiZIDE (GLUCOTROL) 5 MG tablet TAKE ONE TABLET BY MOUTH DAILY 15-30 MINUTES BEFORE DINNER 30 tablet 1  . glucose blood (ONETOUCH VERIO) test strip Use to check blood sugar 1 time per day. Dx Code E11.9 100 each 12  . hydrochlorothiazide (HYDRODIURIL) 25 MG tablet TAKE 1/2 TABLET BY MOUTH BY MOUTH DAILY 15 tablet 5  . hyoscyamine (LEVSIN SL) 0.125 MG SL tablet Place 1 tablet (0.125 mg total) under the tongue every 4 (four) hours as needed. 30 tablet 0  . metFORMIN (GLUCOPHAGE) 1000 MG tablet TAKE ONE TABLET BY MOUTH TWICE A DAY  60 tablet 0  . metoprolol tartrate (LOPRESSOR) 50 MG tablet TAKE 1/2 TABLET BY MOUTH TWICE A DAY 30 tablet 5  . [DISCONTINUED] Saxagliptin-Metformin (KOMBIGLYZE XR) 2.11-998 MG TB24 2 daily 30 tablet 2   No current facility-administered medications on file prior to visit.    Past Medical History:  Diagnosis Date  . Colitis    in her 24s; quiescent since  . DM (diabetes mellitus) ([place])   . HTN (hypertension)   . Hyperlipidemia     Past Surgical History:  Procedure Laterality Date  . CERVIX SURGERY  2001   Cryosurgery, Dr Connye Burkitt  . COLONOSCOPY  2009   Negative, Dr. Delfin Edis  . FOOT SURGERY     bone spurs resected, Dr Aida Puffer  . KNEE ARTHROSCOPY  1980   Right  . LAMINECTOMY  2007   X 2, Dr Gladstone Lighter    Social History   Socioeconomic History  . Marital status: Divorced    Spouse name: Not on file  . Number of children: Not on file  . Years of education: Not on file  . Highest education level: Not on file  Occupational History  . Not on file  Tobacco Use  . Smoking status: Former Smoker    Quit date: 07/17/1977    Years since quitting: 42.0  . Smokeless tobacco: Never Used  . Tobacco comment: Smoked in College Only ;  [id number], up to 1/2 ppd  Substance and Sexual Activity  . Alcohol use: Yes    Comment:  rarely  . Drug use: No  . Sexual activity: Not on file  Other Topics Concern  . Not on file  Social History Narrative  . Not on file   Social Determinants of Health   Financial Resource Strain:   . Difficulty of Paying Living Expenses: Not on file  Food Insecurity:   . Worried About Charity fundraiser in the Last Year: Not on file  . Ran Out of Food in the Last Year: Not on file  Transportation Needs:   . Lack of Transportation (Medical): Not on file  . Lack of Transportation (Non-Medical): Not on file  Physical Activity:   . Days of Exercise per Week: Not on file  . Minutes of Exercise per Session: Not on file  Stress:   . Feeling of Stress : Not on  file  Social Connections:   . Frequency of Communication with Friends and Family: Not on file  . Frequency of Social Gatherings with Friends and Family: Not on file  . Attends Religious Services: Not on file  . Active Member of Clubs or Organizations: Not on file  . Attends Archivist Meetings: Not on file  . Marital Status: Not on file    Family History  Problem Relation Age of Onset  . Diabetes Father   . Hypertension Father   . Heart attack Father 27       precipitated by bleeding ulcer  . Diabetes Mother   . Hypertension Mother   . Coronary artery disease Mother   . Heart attack Paternal Grandfather        ? 81  . Heart attack Maternal Grandfather 68  . Diabetes Maternal Grandmother   . Stroke Maternal Grandmother 82  . Cancer Neg Hx     Review of Systems  Constitutional: Negative for chills and fever.  Eyes: Negative for visual disturbance.  Respiratory: Negative for cough, shortness of breath and wheezing.   Cardiovascular: Negative for chest pain, palpitations and leg swelling.  Gastrointestinal: Negative for abdominal pain, blood in stool, constipation, diarrhea and nausea.       No gerd  Genitourinary: Negative for dysuria and hematuria.  Musculoskeletal: Positive for arthralgias and back pain.  Skin: Negative for color change and rash.  Neurological: Negative for light-headedness, numbness and headaches.  Psychiatric/Behavioral: Negative for dysphoric mood. The patient is not nervous/anxious.        Objective:   Vitals:   07/08/19 0925  BP: 118/74  Pulse: (!) 106  Resp: 16  Temp: 98.3 F (36.8 C)  SpO2: 98%   Filed Weights   07/08/19 0925  Weight: 215 lb (97.5 kg)   Body mass index is 31.75 kg/m.  BP Readings from Last 3 Encounters:  07/08/19 118/74  06/10/19 120/62  08/12/18 114/70    Wt Readings from Last 3 Encounters:  07/08/19 215 lb (97.5 kg)  06/10/19 212 lb (96.2 kg)  08/12/18 217 lb 9.6 oz (98.7 kg)     Physical  Exam Constitutional: She appears well-developed and well-nourished. No distress.  HENT:  Head: Normocephalic and atraumatic.  Right Ear: External ear normal. Normal ear canal and TM Left Ear: External ear normal.  Normal ear canal and TM Mouth/Throat: Oropharynx is clear and moist.  Eyes: Conjunctivae and EOM are normal.  Neck: Neck supple. No tracheal deviation present. No thyromegaly present.  No carotid bruit  Cardiovascular: Normal rate, regular rhythm and normal heart sounds.   No murmur heard.  No edema. Pulmonary/Chest: Effort normal and breath sounds normal. No respiratory distress. She has no wheezes. She has no rales.  Breast: deferred   Abdominal: Soft. She exhibits no distension. There is no tenderness.  Lymphadenopathy: She has no cervical adenopathy.  Skin: Skin is warm and dry. She is not diaphoretic.  Psychiatric: She has a normal mood and affect. Her behavior is normal.        Assessment & Plan:   Physical exam: Screening blood work    ordered Immunizations  Up to date Colonoscopy  Due - will do early next year Mammogram  Up to date Gyn  Up to date  Eye exams  Up to date  Exercise  Walks her dogs, but no regular exercise.  Stressed the importance of increasing exercise-longer walks with the dog or separate exercise from walking the dogs Weight  Advised weight loss Substance abuse  none  See Problem List for Assessment and Plan of chronic medical problems.      This visit occurred during the SARS-CoV-2 public health emergency.  Safety protocols were in place, including screening questions prior to the visit, additional usage of staff PPE, and extensive cleaning of exam room while observing appropriate contact time as indicated for disinfecting solutions.

## 2019-07-07 NOTE — Assessment & Plan Note (Addendum)
She is following with Dr Cruzita Lederer  Lab Results  Component Value Date   HGBA1C 7.3 (A) 06/10/2019   Encouraged regular exercise

## 2019-07-07 NOTE — Patient Instructions (Addendum)
Tests ordered today. Your results will be released to [name] (or called to you) after review.  If any changes need to be made, you will be notified at that same time.  All other Health Maintenance issues reviewed.   All recommended immunizations and age-appropriate screenings are up-to-date or discussed.  No immunization administered today.   Medications reviewed and updated.  Changes include :   none    Please followup in 6 months    Health Maintenance, Female Adopting a healthy lifestyle and getting preventive care are important in promoting health and wellness. Ask your health care provider about:  The right schedule for you to have regular tests and exams.  Things you can do on your own to prevent diseases and keep yourself healthy. What should I know about diet, weight, and exercise? Eat a healthy diet   Eat a diet that includes plenty of vegetables, fruits, low-fat dairy products, and lean protein.  Do not eat a lot of foods that are high in solid fats, added sugars, or sodium. Maintain a healthy weight Body mass index (BMI) is used to identify weight problems. It estimates body fat based on height and weight. Your health care provider can help determine your BMI and help you achieve or maintain a healthy weight. Get regular exercise Get regular exercise. This is one of the most important things you can do for your health. Most adults should:  Exercise for at least 150 minutes each week. The exercise should increase your heart rate and make you sweat (moderate-intensity exercise).  Do strengthening exercises at least twice a week. This is in addition to the moderate-intensity exercise.  Spend less time sitting. Even light physical activity can be beneficial. Watch cholesterol and blood lipids Have your blood tested for lipids and cholesterol at 63 years of age, then have this test every 5 years. Have your cholesterol levels checked more often if:  Your lipid or  cholesterol levels are high.  You are older than 63 years of age.  You are at high risk for heart disease. What should I know about cancer screening? Depending on your health history and family history, you may need to have cancer screening at various ages. This may include screening for:  Breast cancer.  Cervical cancer.  Colorectal cancer.  Skin cancer.  Lung cancer. What should I know about heart disease, diabetes, and high blood pressure? Blood pressure and heart disease  High blood pressure causes heart disease and increases the risk of stroke. This is more likely to develop in people who have high blood pressure readings, are of African descent, or are overweight.  Have your blood pressure checked: ? Every 3-5 years if you are 48-50 years of age. ? Every year if you are 21 years old or older. Diabetes Have regular diabetes screenings. This checks your fasting blood sugar level. Have the screening done:  Once every three years after age 53 if you are at a normal weight and have a low risk for diabetes.  More often and at a younger age if you are overweight or have a high risk for diabetes. What should I know about preventing infection? Hepatitis B If you have a higher risk for hepatitis B, you should be screened for this virus. Talk with your health care provider to find out if you are at risk for hepatitis B infection. Hepatitis C Testing is recommended for:  Everyone born from [birth year] through 1965.  Anyone with known risk factors for hepatitis C.  Sexually transmitted infections (STIs)  Get screened for STIs, including gonorrhea and chlamydia, if: ? You are sexually active and are younger than 63 years of age. ? You are older than 63 years of age and your health care provider tells you that you are at risk for this type of infection. ? Your sexual activity has changed since you were last screened, and you are at increased risk for chlamydia or gonorrhea. Ask your  health care provider if you are at risk.  Ask your health care provider about whether you are at high risk for HIV. Your health care provider may recommend a prescription medicine to help prevent HIV infection. If you choose to take medicine to prevent HIV, you should first get tested for HIV. You should then be tested every 3 months for as long as you are taking the medicine. Pregnancy  If you are about to stop having your period (premenopausal) and you may become pregnant, seek counseling before you get pregnant.  Take 400 to 800 micrograms (mcg) of folic acid every day if you become pregnant.  Ask for birth control (contraception) if you want to prevent pregnancy. Osteoporosis and menopause Osteoporosis is a disease in which the bones lose minerals and strength with aging. This can result in bone fractures. If you are 13 years old or older, or if you are at risk for osteoporosis and fractures, ask your health care provider if you should:  Be screened for bone loss.  Take a calcium or vitamin D supplement to lower your risk of fractures.  Be given hormone replacement therapy (HRT) to treat symptoms of menopause. Follow these instructions at home: Lifestyle  Do not use any products that contain nicotine or tobacco, such as cigarettes, e-cigarettes, and chewing tobacco. If you need help quitting, ask your health care provider.  Do not use street drugs.  Do not share needles.  Ask your health care provider for help if you need support or information about quitting drugs. Alcohol use  Do not drink alcohol if: ? Your health care provider tells you not to drink. ? You are pregnant, may be pregnant, or are planning to become pregnant.  If you drink alcohol: ? Limit how much you use to 0-1 drink a day. ? Limit intake if you are breastfeeding.  Be aware of how much alcohol is in your drink. In the [place] one drink equals one 12 oz bottle of beer (355 mL), one 5 oz glass of wine (148  mL), or one 1 oz glass of hard liquor (44 mL). General instructions  Schedule regular health, dental, and eye exams.  Stay current with your vaccines.  Tell your health care provider if: ? You often feel depressed. ? You have ever been abused or do not feel safe at home. Summary  Adopting a healthy lifestyle and getting preventive care are important in promoting health and wellness.  Follow your health care provider's instructions about healthy diet, exercising, and getting tested or screened for diseases.  Follow your health care provider's instructions on monitoring your cholesterol and blood pressure. This information is not intended to replace advice given to you by your health care provider. Make sure you discuss any questions you have with your health care provider. Document Released: 01/16/2011 Document Revised: 06/26/2018 Document Reviewed: 06/26/2018 Elsevier Patient Education  2020 [employer].

## 2019-07-08 ENCOUNTER — Encounter: Payer: Self-pay | Admitting: Internal Medicine

## 2019-07-08 ENCOUNTER — Ambulatory Visit (INDEPENDENT_AMBULATORY_CARE_PROVIDER_SITE_OTHER): Payer: 59 | Admitting: Internal Medicine

## 2019-07-08 ENCOUNTER — Other Ambulatory Visit (INDEPENDENT_AMBULATORY_CARE_PROVIDER_SITE_OTHER): Payer: 59

## 2019-07-08 ENCOUNTER — Other Ambulatory Visit: Payer: 59

## 2019-07-08 ENCOUNTER — Other Ambulatory Visit: Payer: Self-pay

## 2019-07-08 VITALS — BP 118/74 | HR 106 | Temp 98.3°F | Resp 16 | Ht 69.0 in | Wt 215.0 lb

## 2019-07-08 DIAGNOSIS — I10 Essential (primary) hypertension: Secondary | ICD-10-CM

## 2019-07-08 DIAGNOSIS — E1151 Type 2 diabetes mellitus with diabetic peripheral angiopathy without gangrene: Secondary | ICD-10-CM

## 2019-07-08 DIAGNOSIS — Z0000 Encounter for general adult medical examination without abnormal findings: Secondary | ICD-10-CM | POA: Diagnosis not present

## 2019-07-08 DIAGNOSIS — E7849 Other hyperlipidemia: Secondary | ICD-10-CM

## 2019-07-08 LAB — COMPREHENSIVE METABOLIC PANEL
ALT: 41 U/L — ABNORMAL HIGH (ref 0–35)
AST: 57 U/L — ABNORMAL HIGH (ref 0–37)
Albumin: 4.1 g/dL (ref 3.5–5.2)
Alkaline Phosphatase: 90 U/L (ref 39–117)
BUN: 22 mg/dL (ref 6–23)
CO2: 22 mEq/L (ref 19–32)
Calcium: 9.3 mg/dL (ref 8.4–10.5)
Chloride: 107 mEq/L (ref 96–112)
Creatinine, Ser: 1.25 mg/dL — ABNORMAL HIGH (ref 0.40–1.20)
GFR: 52.28 mL/min — ABNORMAL LOW (ref >60.00)
Glucose, Bld: 192 mg/dL — ABNORMAL HIGH (ref 70–99)
Potassium: 4.1 mEq/L (ref 3.5–5.1)
Sodium: 142 mEq/L (ref 135–145)
Total Bilirubin: 0.9 mg/dL (ref 0.2–1.2)
Total Protein: 7 g/dL (ref 6.0–8.3)

## 2019-07-08 LAB — CBC WITH DIFFERENTIAL/PLATELET
Basophils Absolute: 0 10*3/uL (ref 0.0–0.1)
Basophils Relative: 0.4 % (ref 0.0–3.0)
Eosinophils Absolute: 0.3 10*3/uL (ref 0.0–0.7)
Eosinophils Relative: 6.2 % — ABNORMAL HIGH (ref 0.0–5.0)
HCT: 40.7 % (ref 36.0–46.0)
Hemoglobin: 13.4 g/dL (ref 12.0–15.0)
Lymphocytes Relative: 25.3 % (ref 12.0–46.0)
Lymphs Abs: 1.4 10*3/uL (ref 0.7–4.0)
MCHC: 32.9 g/dL (ref 30.0–36.0)
MCV: 94.5 fl (ref 78.0–100.0)
Monocytes Absolute: 0.4 10*3/uL (ref 0.1–1.0)
Monocytes Relative: 7 % (ref 3.0–12.0)
Neutro Abs: 3.4 10*3/uL (ref 1.4–7.7)
Neutrophils Relative %: 61.1 % (ref 43.0–77.0)
Platelets: 106 10*3/uL — ABNORMAL LOW (ref 150.0–400.0)
RBC: 4.31 Mil/uL (ref 3.87–5.11)
RDW: 13 % (ref 11.5–15.5)
WBC: 5.5 10*3/uL (ref 4.0–10.5)

## 2019-07-08 LAB — LIPID PANEL
Cholesterol: 167 mg/dL (ref 0–200)
HDL: 49.4 mg/dL
LDL Cholesterol: 102 mg/dL — ABNORMAL HIGH (ref 0–99)
NonHDL: 117.62
Total CHOL/HDL Ratio: 3
Triglycerides: 78 mg/dL (ref 0.0–149.0)
VLDL: 15.6 mg/dL (ref 0.0–40.0)

## 2019-07-08 LAB — TSH: TSH: 0.57 u[IU]/mL (ref 0.35–4.50)

## 2019-07-08 NOTE — Assessment & Plan Note (Signed)
Check lipid panel, TSH, CMP Continue daily statin Regular exercise and healthy diet encouraged

## 2019-07-08 NOTE — Assessment & Plan Note (Signed)
BP well controlled Current regimen effective and well tolerated Continue current medications at current doses CMP 

## 2019-07-13 ENCOUNTER — Encounter: Payer: Self-pay | Admitting: Internal Medicine

## 2019-07-13 DIAGNOSIS — D696 Thrombocytopenia, unspecified: Secondary | ICD-10-CM | POA: Insufficient documentation

## 2019-07-22 ENCOUNTER — Other Ambulatory Visit: Payer: Self-pay | Admitting: Internal Medicine

## 2019-07-22 NOTE — Telephone Encounter (Signed)
Please advise if you still fill this.

## 2019-07-22 NOTE — Telephone Encounter (Signed)
Per PCP.

## 2019-07-28 ENCOUNTER — Other Ambulatory Visit: Payer: Self-pay | Admitting: Internal Medicine

## 2019-07-29 NOTE — Telephone Encounter (Signed)
This is usually filled by PCP

## 2019-07-29 NOTE — Telephone Encounter (Signed)
Ok to fill for 90 day?

## 2019-08-04 ENCOUNTER — Encounter: Payer: Self-pay | Admitting: Internal Medicine

## 2019-08-06 MED ORDER — BENAZEPRIL HCL 40 MG PO TABS: 40.0000 mg | ORAL_TABLET | Freq: Every day | ORAL | 4 refills | Status: DC

## 2019-09-01 ENCOUNTER — Other Ambulatory Visit: Payer: Self-pay | Admitting: Internal Medicine

## 2019-09-09 ENCOUNTER — Ambulatory Visit: Payer: 59 | Admitting: Nurse Practitioner

## 2019-09-09 ENCOUNTER — Other Ambulatory Visit: Payer: Self-pay

## 2019-09-09 ENCOUNTER — Encounter: Payer: Self-pay | Admitting: Nurse Practitioner

## 2019-09-09 VITALS — BP 120/64 | HR 101 | Temp 97.9°F | Ht 69.0 in | Wt 210.0 lb

## 2019-09-09 DIAGNOSIS — R140 Abdominal distension (gaseous): Secondary | ICD-10-CM | POA: Diagnosis not present

## 2019-09-09 DIAGNOSIS — Z1211 Encounter for screening for malignant neoplasm of colon: Secondary | ICD-10-CM | POA: Diagnosis not present

## 2019-09-09 DIAGNOSIS — R1013 Epigastric pain: Secondary | ICD-10-CM

## 2019-09-09 DIAGNOSIS — Z01818 Encounter for other preprocedural examination: Secondary | ICD-10-CM

## 2019-09-09 MED ORDER — NA SULFATE-K SULFATE-MG SULF 17.5-3.13-1.6 GM/177ML PO SOLN: ORAL | 0 refills | Status: DC

## 2019-09-09 NOTE — Patient Instructions (Addendum)
If you are age 64 or older, your body mass index should be between 23-30. Your Body mass index is 31.01 kg/m. If this is out of the aforementioned range listed, please consider follow up with your Primary Care Provider.  If you are age 30 or younger, your body mass index should be between 19-25. Your Body mass index is 31.01 kg/m. If this is out of the aformentioned range listed, please consider follow up with your Primary Care Provider.   You have been scheduled for a colonoscopy. Please follow written instructions given to you at your visit today.  Please pick up your prep supplies at the pharmacy within the next 1-3 days. If you use inhalers (even only as needed), please bring them with you on the day of your procedure. Your physician has requested that you go to www.startemmi.com and enter the access code given to you at your visit today. This web site gives a general overview about your procedure. However, you should still follow specific instructions given to you by our office regarding your preparation for the procedure.  We have sent the following medications to your pharmacy for you to pick up at your convenience: [place] have been given a Cytogeneticist.  Thank you for choosing me and [facility].   Tye Savoy, NP

## 2019-09-09 NOTE — Progress Notes (Signed)
I agree with the above note, plan 

## 2019-09-09 NOTE — Progress Notes (Signed)
ASSESSMENT / PLAN:   [name] is a 64 y.o. female with a pmh significant for, but not necessarily limited to, DM and hypertension  # Colon cancer screening.  Slightly overdue for 10-year screening colonoscopy. -The risks and benefits of colonoscopy with possible polypectomy / biopsies were discussed and the patient agrees to proceed.   # Gas, bloating, occasional constipation, occasional diarrhea.  -Gas brochure given. If colonoscopy negative, elimination of gas producing foods not helpful then consider breath test for SIBO as she is at risk given diabetes.   # chronic episodic upper abdominal pain.  -Describes very specific episodes occurring every few months. No pain in between episodes. Last episode was ~ a month ago.  Hard to evaluate right now since no recent episodes.  -If/when another episode occurs I asked the patient to call our office for labs.  Would recommend liver test and lipase.  Consider RUQ at some point.    # thrombocytopenia, chronic. Platelets 107  HPI:     Chief Complaint:  Gas, bloating, intermittent constipation, intermittent diarrhea , intermittent upper abdominal pain, talk about colon cancer screening   History comes from chart and patient  This patient is a 64 year old female previously known to Dr. Olevia Perches.  She had a normal colonoscopy in 2019.  Patient received a colonoscopy recall letter but was not having any problems at that time and forgot to schedule the procedure  Pleasant comes in today to get her screening colonoscopy scheduled but also to address some gastrointestinal problems.  She complains of bloating and intestinal gas but her main concern is that of intermittent upper abdominal pain.  She gets episodes of hunger type pains in her upper abdomen.  The episodes start before lunch. Pain slowly escalates then eventually starts to slowly subside over a few hours.  She does not eat lunch when the pain occurs.  The pain does not radiate  through to the back, Tylenol helps to some degree.  There is no associated nausea / vomiting nor bowel changes associated with the pain.  Patient does not take  NSAIDs. Initially episodes were much more frequent occurring once every 3 to 4 weeks but now she has an episode only every few months.  Her last episode was a month ago.    Her weight has been stable though she has started a weight loss program.  No blood in stool.  Stools can range from being normal in consistency to occasionally loose and she is occasionally constipated.    Data Reviewed:  07/08/19 Cr 1.25, Glu 192 WBC 5.5, hgb 13.4, platelets 106   Past Medical History:  Diagnosis Date  . Colitis    in her 110s; quiescent since  . DM (diabetes mellitus) ([place])   . HTN (hypertension)   . Hyperlipidemia      Past Surgical History:  Procedure Laterality Date  . CERVIX SURGERY  2001   Cryosurgery, Dr Connye Burkitt  . COLONOSCOPY  2009   Negative, Dr. Delfin Edis  . FOOT SURGERY     bone spurs resected, Dr Aida Puffer  . KNEE ARTHROSCOPY  1980   Right  . LAMINECTOMY  2007   X 2, Dr Gladstone Lighter   Family History  Problem Relation Age of Onset  . Diabetes Father   . Hypertension Father   . Heart attack Father 59       precipitated by bleeding ulcer  . Diabetes Mother   .  Hypertension Mother   . Coronary artery disease Mother   . Heart attack Paternal Grandfather        ? 4  . Heart attack Maternal Grandfather 68  . Diabetes Maternal Grandmother   . Stroke Maternal Grandmother 82  . Cancer Neg Hx    Social History   Tobacco Use  . Smoking status: Former Smoker    Quit date: 07/17/1977    Years since quitting: 42.1  . Smokeless tobacco: Never Used  . Tobacco comment: Smoked in [place] ; [phone], up to 1/2 ppd  Substance Use Topics  . Alcohol use: Yes    Comment:  rarely  . Drug use: No   Current Outpatient Medications  Medication Sig Dispense Refill  . atorvastatin (LIPITOR) 40 MG tablet TAKE ONE TABLET BY MOUTH  DAILY AT 6 PM 90 tablet 1  . benazepril (LOTENSIN) 40 MG tablet Take 1 tablet (40 mg total) by mouth daily. 90 tablet 4  . Dulaglutide (TRULICITY) 3 SE/8.3TD SOPN Inject 3 mg into the skin once a week. 12 pen 3  . ezetimibe (ZETIA) 10 MG tablet TAKE ONE TABLET BY MOUTH DAILY 90 tablet 1  . fluticasone (FLONASE) 50 MCG/ACT nasal spray Place 2 sprays into both nostrils daily. 16 g 6  . glipiZIDE (GLUCOTROL) 5 MG tablet TAKE ONE TABLET BY MOUTH DAILY 15-30 MINUTES BEFORE DINNER 30 tablet 1  . glucose blood (ONETOUCH VERIO) test strip Use to check blood sugar 1 time per day. Dx Code E11.9 100 each 12  . hydrochlorothiazide (HYDRODIURIL) 25 MG tablet TAKE 1/2 TABLET BY MOUTH BY MOUTH DAILY 15 tablet 5  . hyoscyamine (LEVSIN SL) 0.125 MG SL tablet Place 1 tablet (0.125 mg total) under the tongue every 4 (four) hours as needed. 30 tablet 0  . metFORMIN (GLUCOPHAGE) 1000 MG tablet TAKE ONE TABLET BY MOUTH TWICE A DAY 60 tablet 0  . metoprolol tartrate (LOPRESSOR) 50 MG tablet TAKE 1/2 TABLET BY MOUTH TWICE A DAY 30 tablet 5   No current facility-administered medications for this visit.   Allergies  Allergen Reactions  . Atorvastatin     REACTION: ELEVATES LFT'S. Pt states she is currently taking Atorvastatin 40 mg (1/2 tablet daily) and does not recall hab=ving any issues with it. (07/14/14)  . Codeine Nausea Only       . Neomycin-Bacitracin Zn-Polymyx Rash  . Onglyza [Saxagliptin Hydrochloride] Rash    This occurred after starting Kombiglyze . This was present as a rash over the hands and feet dorsally. She has been able to take metformin in the past as well as Janumet without problems.  . Sulfamethoxazole-Trimethoprim Nausea And Vomiting     Nausea was extreme.       Review of Systems: All systems systems reviewed and negative except where noted in HPI.   Creatinine clearance cannot be calculated (Patient's most recent lab result is older than the maximum 21 days allowed.)   Physical  Exam:    Wt Readings from Last 3 Encounters:  09/09/19 210 lb (95.3 kg)  07/08/19 215 lb (97.5 kg)  06/10/19 212 lb (96.2 kg)    BP 120/64   Pulse (!) 101   Temp 97.9 F (36.6 C)   Ht 5' 9"  (1.753 m)   Wt 210 lb (95.3 kg)   BMI 31.01 kg/m  Constitutional:  Pleasant female in no acute distress. Psychiatric: Normal mood and affect. Behavior is normal. EENT: Pupils normal.  Conjunctivae are normal. No scleral icterus. Neck supple.  Cardiovascular: Normal rate, regular rhythm. No edema Pulmonary/chest: Effort normal and breath sounds normal. No wheezing, rales or rhonchi. Abdominal: Soft, nondistended, nontender. Bowel sounds active throughout. There are no masses palpable. No hepatomegaly. Neurological: Alert and oriented to person place and time. Skin: Skin is warm and dry. No rashes noted.  Tye Savoy, NP  09/09/2019, 11:50 AM

## 2019-09-21 ENCOUNTER — Other Ambulatory Visit: Payer: Self-pay | Admitting: Internal Medicine

## 2019-09-21 DIAGNOSIS — I10 Essential (primary) hypertension: Secondary | ICD-10-CM

## 2019-10-05 ENCOUNTER — Encounter: Payer: Self-pay | Admitting: Internal Medicine

## 2019-10-08 ENCOUNTER — Ambulatory Visit: Payer: 59 | Admitting: Internal Medicine

## 2019-10-09 ENCOUNTER — Telehealth: Payer: Self-pay | Admitting: Gastroenterology

## 2019-10-09 NOTE — Telephone Encounter (Signed)
Hi Dr. 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 Silverio Decamp.   She is currently scheduled for 10/22/19 please advise.   Thank you

## 2019-10-10 NOTE — Telephone Encounter (Signed)
That is OK with me if it is OK with Dr. Silverio Decamp

## 2019-10-13 NOTE — Telephone Encounter (Signed)
Hi Dr. Silverio Decamp,  Please advise on message below.   Thank you

## 2019-10-20 NOTE — Telephone Encounter (Signed)
Its fine, thanks

## 2019-10-22 ENCOUNTER — Encounter: Payer: 59 | Admitting: Gastroenterology

## 2019-10-23 ENCOUNTER — Encounter: Payer: 59 | Admitting: Gastroenterology

## 2019-11-07 ENCOUNTER — Ambulatory Visit (INDEPENDENT_AMBULATORY_CARE_PROVIDER_SITE_OTHER): Payer: 59

## 2019-11-07 ENCOUNTER — Other Ambulatory Visit: Payer: Self-pay | Admitting: Internal Medicine

## 2019-11-07 DIAGNOSIS — Z1159 Encounter for screening for other viral diseases: Secondary | ICD-10-CM

## 2019-11-08 LAB — SARS CORONAVIRUS 2 (TAT 6-24 HRS): SARS Coronavirus 2: NEGATIVE

## 2019-11-11 ENCOUNTER — Encounter: Payer: Self-pay | Admitting: Gastroenterology

## 2019-11-11 ENCOUNTER — Other Ambulatory Visit: Payer: Self-pay

## 2019-11-11 ENCOUNTER — Ambulatory Visit (AMBULATORY_SURGERY_CENTER): Payer: 59 | Admitting: Gastroenterology

## 2019-11-11 VITALS — BP 99/59 | HR 103 | Temp 96.9°F | Resp 23 | Ht 69.0 in | Wt 210.0 lb

## 2019-11-11 DIAGNOSIS — Z1211 Encounter for screening for malignant neoplasm of colon: Secondary | ICD-10-CM

## 2019-11-11 DIAGNOSIS — K5100 Ulcerative (chronic) pancolitis without complications: Secondary | ICD-10-CM | POA: Diagnosis not present

## 2019-11-11 DIAGNOSIS — D120 Benign neoplasm of cecum: Secondary | ICD-10-CM | POA: Diagnosis not present

## 2019-11-11 DIAGNOSIS — D122 Benign neoplasm of ascending colon: Secondary | ICD-10-CM

## 2019-11-11 DIAGNOSIS — D123 Benign neoplasm of transverse colon: Secondary | ICD-10-CM | POA: Diagnosis not present

## 2019-11-11 MED ORDER — SODIUM CHLORIDE 0.9 % IV SOLN: 500.0000 mL | Freq: Once | INTRAVENOUS | Status: DC

## 2019-11-11 NOTE — Op Note (Signed)
[facility] Patient Name: [name] Procedure Date: 11/11/2019 2:42 PM MRN: [id number] Endoscopist: Mauri Pole , MD Age: 64 Referring MD:  Date of Birth: July 17, 1956 Gender: Female Account #: [id number] Procedure:                Colonoscopy Indications:              High risk colon cancer surveillance: Inflammatory                            bowel disease (unclassified) of 8 (or more) years                            duration with one-third (or more) of the colon                            involved. Last colonoscopy 2009. Remote history of                            colitis, in remission. Medicines:                Monitored Anesthesia Care Procedure:                Pre-Anesthesia Assessment:                           - Prior to the procedure, a History and Physical                            was performed, and patient medications and                            allergies were reviewed. The patient's tolerance of                            previous anesthesia was also reviewed. The risks                            and benefits of the procedure and the sedation                            options and risks were discussed with the patient.                            All questions were answered, and informed consent                            was obtained. Prior Anticoagulants: The patient has                            taken no previous anticoagulant or antiplatelet                            agents. ASA Grade Assessment: II - A patient with  mild systemic disease. After reviewing the risks                            and benefits, the patient was deemed in                            satisfactory condition to undergo the procedure.                           After obtaining informed consent, the colonoscope                            was passed under direct vision. Throughout the                            procedure, the patient's blood  pressure, pulse, and                            oxygen saturations were monitored continuously. The                            Colonoscope was introduced through the anus and                            advanced to the the cecum, identified by                            appendiceal orifice and ileocecal valve. The                            colonoscopy was performed without difficulty. The                            patient tolerated the procedure well. The quality                            of the bowel preparation was fair. The ileocecal                            valve, appendiceal orifice, and rectum were                            photographed. Scope In: 3:18:05 PM Scope Out: 3:42:36 PM Scope Withdrawal Time: 0 hours 19 minutes 46 seconds  Total Procedure Duration: 0 hours 24 minutes 31 seconds  Findings:                 The perianal and digital rectal examinations were                            normal.                           A 4 mm polyp was found in the cecum. The polyp was  sessile. The polyp was removed with a cold snare.                            Resection and retrieval were complete.                           Three sessile polyps were found in the transverse                            colon and ascending colon. The polyps were 1 to 2                            mm in size. These polyps were removed with a cold                            biopsy forceps. Resection and retrieval were                            complete.                           The mucosa vascular pattern in the rectum, in the                            sigmoid colon, in the transverse colon, in the                            ascending colon and in the cecum was segmentally                            decreased. Biopsies were taken with a cold forceps                            for histology from right and left colon.                           Non-bleeding internal hemorrhoids were  found during                            retroflexion. The hemorrhoids were small. Complications:            No immediate complications. Estimated Blood Loss:     Estimated blood loss was minimal. Impression:               - Preparation of the colon was fair.                           - One 4 mm polyp in the cecum, removed with a cold                            snare. Resected and retrieved.                           - Three 1 to 2 mm polyps  in the transverse colon                            and in the ascending colon, removed with a cold                            biopsy forceps. Resected and retrieved.                           - Decreased mucosa vascular pattern in the rectum,                            in the sigmoid colon, in the transverse colon, in                            the ascending colon and in the cecum. Biopsied.                           - Non-bleeding internal hemorrhoids. Recommendation:           - Patient has a contact number available for                            emergencies. The signs and symptoms of potential                            delayed complications were discussed with the                            patient. Return to normal activities tomorrow.                            Written discharge instructions were provided to the                            patient.                           - Resume previous diet.                           - Continue present medications.                           - Await pathology results.                           - Repeat colonoscopy in 1 year for sub optimal                            bowel prep and surveillance based on pathology                            results.                           -  For future colonoscopy the patient will require                            an extended preparation. If there are any                            questions, please contact the gastroenterologist. Mauri Pole, MD 11/11/2019 4:01:31  PM This report has been signed electronically.

## 2019-11-11 NOTE — Progress Notes (Signed)
pt tolerated well. VSS. awake and to recovery. Report given to RN.  

## 2019-11-11 NOTE — Progress Notes (Signed)
Vital signs- Manele and phone given to daughter per patient request

## 2019-11-11 NOTE — Patient Instructions (Signed)
Handouts Provided:  Polyps  YOU HAD AN ENDOSCOPIC PROCEDURE TODAY AT THE [facility]:   Refer to the procedure report that was given to you for any specific questions about what was found during the examination.  If the procedure report does not answer your questions, please call your gastroenterologist to clarify.  If you requested that your care partner not be given the details of your procedure findings, then the procedure report has been included in a sealed envelope for you to review at your convenience later.  YOU SHOULD EXPECT: Some feelings of bloating in the abdomen. Passage of more gas than usual.  Walking can help get rid of the air that was put into your GI tract during the procedure and reduce the bloating. If you had a lower endoscopy (such as a colonoscopy or flexible sigmoidoscopy) you may notice spotting of blood in your stool or on the toilet paper. If you underwent a bowel prep for your procedure, you may not have a normal bowel movement for a few days.  Please Note:  You might notice some irritation and congestion in your nose or some drainage.  This is from the oxygen used during your procedure.  There is no need for concern and it should clear up in a day or so.  SYMPTOMS TO REPORT IMMEDIATELY:   Following lower endoscopy (colonoscopy or flexible sigmoidoscopy):  Excessive amounts of blood in the stool  Significant tenderness or worsening of abdominal pains  Swelling of the abdomen that is new, acute  Fever of 100F or higher  For urgent or emergent issues, a gastroenterologist can be reached at any hour by calling [phone]. Do not use MyChart messaging for urgent concerns.    DIET:  We do recommend a small meal at first, but then you may proceed to your regular diet.  Drink plenty of fluids but you should avoid alcoholic beverages for 24 hours.  ACTIVITY:  You should plan to take it easy for the rest of today and you should NOT DRIVE or use heavy  machinery until tomorrow (because of the sedation medicines used during the test).    FOLLOW UP: Our staff will call the number listed on your records 48-72 hours following your procedure to check on you and address any questions or concerns that you may have regarding the information given to you following your procedure. If we do not reach you, we will leave a message.  We will attempt to reach you two times.  During this call, we will ask if you have developed any symptoms of COVID 19. If you develop any symptoms (ie: fever, flu-like symptoms, shortness of breath, cough etc.) before then, please call [phone].  If you test positive for Covid 19 in the 2 weeks post procedure, please call and report this information to us.    If any biopsies were taken you will be contacted by phone or by letter within the next 1-3 weeks.  Please call us at [phone] if you have not heard about the biopsies in 3 weeks.    SIGNATURES/CONFIDENTIALITY: You and/or your care partner have signed paperwork which will be entered into your electronic medical record.  These signatures attest to the fact that that the information above on your After Visit Summary has been reviewed and is understood.  Full responsibility of the confidentiality of this discharge information lies with you and/or your care-partner.  

## 2019-11-11 NOTE — Progress Notes (Signed)
Called to room to assist during endoscopic procedure.  Patient ID and intended procedure confirmed with present staff. Received instructions for my participation in the procedure from the performing physician.  

## 2019-11-13 ENCOUNTER — Ambulatory Visit: Payer: 59 | Admitting: Internal Medicine

## 2019-11-13 ENCOUNTER — Other Ambulatory Visit: Payer: Self-pay

## 2019-11-13 ENCOUNTER — Encounter: Payer: Self-pay | Admitting: Internal Medicine

## 2019-11-13 ENCOUNTER — Telehealth: Payer: Self-pay

## 2019-11-13 VITALS — BP 130/70 | HR 103 | Ht 69.0 in | Wt 207.0 lb

## 2019-11-13 DIAGNOSIS — E1151 Type 2 diabetes mellitus with diabetic peripheral angiopathy without gangrene: Secondary | ICD-10-CM

## 2019-11-13 DIAGNOSIS — E669 Obesity, unspecified: Secondary | ICD-10-CM

## 2019-11-13 DIAGNOSIS — E7849 Other hyperlipidemia: Secondary | ICD-10-CM

## 2019-11-13 LAB — POCT GLYCOSYLATED HEMOGLOBIN (HGB A1C): Hemoglobin A1C: 6.1 % — AB (ref 4.0–5.6)

## 2019-11-13 MED ORDER — METFORMIN HCL 1000 MG PO TABS: 1000.0000 mg | ORAL_TABLET | Freq: Two times a day (BID) | ORAL | 3 refills | Status: DC

## 2019-11-13 NOTE — Addendum Note (Signed)
Addended by: Cardell Peach I on: 11/13/2019 10:08 AM   Modules accepted: Orders

## 2019-11-13 NOTE — Telephone Encounter (Signed)
Second follow up phone call attempt, no answer, LM

## 2019-11-13 NOTE — Telephone Encounter (Signed)
First post procedure follow up call, no answer 

## 2019-11-13 NOTE — Patient Instructions (Addendum)
Please continue: - Metformin 2000 mg with dinner - Trulicity 3 mg weekly  Use Glipizide 2.5 mg before a larger meal.  Please return in 4 months with your sugar log.

## 2019-11-13 NOTE — Progress Notes (Signed)
Patient ID: [name], female   DOB: July 12, 1956, 64 y.o.   MRN: [id number]  This visit occurred during the SARS-CoV-2 public health emergency.  Safety protocols were in place, including screening questions prior to the visit, additional usage of staff PPE, and extensive cleaning of exam room while observing appropriate contact time as indicated for disinfecting solutions.   HPI: [name] is a pleasant 64 y.o.-year-old female, presenting for follow-up for DM2, dx in 2000, non-insulin-dependent, uncontrolled, with complications (PVD, CKD, DR).  Last visit 5 months ago.  In the past, she lost approximately 20 pounds by improving her diet: Eliminated processed foods and sweets, stopped red and other meats.  However, she gained almost entirely back in 2019-2020.  Since last visit, she lost 10 pounds after we increase Trulicity dose.  She continues to work from home.  Reviewed HbA1c levels: Lab Results  Component Value Date   HGBA1C 7.3 (A) 06/10/2019   HGBA1C 7.7 (H) 07/05/2018   HGBA1C 5.8 (A) 12/14/2017   Pt is on a regimen of: - Metformin 2000 mg with dinner - Glipizide 5 mg before dinner - Trulicity 1.5 >> 3 mg weekly She was on Farxiga 10 mg daily in am >> stopped 08/2017 after mixup at the pharmacy. She was on [employer] stopped covering it. She was on Kombiglyze = Metformin + Onglyza (Saxagliptin) - 1000-2.5 mg daily >> rash.  Pt checks her sugars 0 to once a day,: - am:  110-115 >> 110-120, 140 >> 92, 108-161 >> 65, 81-111, 127 - 2h after b'fast: 221 >> n/c >> 126 >> n/c >> 130, 218 >> n/c - before lunch: n/c >> 138 >> n/c - 2h after lunch: 160-180s, 210 >> 160-177 >> 170-175 >> n/c - before dinner: 208 >> n/c >> 120-128 >> n/c - 2h after dinner: n/c - bedtime: n/c - nighttime: n/c Lowest sugar was low 70s x2 >> 80s >> 110 >> 92 >> 65 x2 ; she has hypoglycemia awareness in the 60s. Highest sugar was 200 >> 218 >> 127.  Glucometer: One Touch Ultra  -+  Mild CKD, last BUN/creatinine:  Lab Results  Component Value Date   BUN 22 07/08/2019   CREATININE 1.25 (H) 07/08/2019   Lab Results  Component Value Date   GFRAA 85 04/22/2008   GFRAA 85 11/22/2006   Lab Results  Component Value Date   MICRALBCREAT 0.7 06/07/2015   MICRALBCREAT 0.8 02/03/2015   MICRALBCREAT 0.5 06/05/2014   MICRALBCREAT 0.4 04/05/2012   MICRALBCREAT 0.6 11/01/2010   MICRALBCREAT 0.4 02/14/2010   MICRALBCREAT 5.1 04/22/2008   MICRALBCREAT 3.0 11/22/2006  On benazepril.  -+ HL. Last set of lipids: Lab Results  Component Value Date   CHOL 167 07/08/2019   HDL 49.40 07/08/2019   LDLCALC 102 (H) 07/08/2019   LDLDIRECT 149.9 02/14/2010   TRIG 78.0 07/08/2019   CHOLHDL 3 07/08/2019  On Lipitor 40 and Zetia 10. - last eye exam was in 08/2018: Hemorrhage in the left eye -+ Numbness and tingling in her feet developed after back surgery  She also has HTN, Had back surgery 2007. Back pain increased 02/2015 >> saw Dr Tamala Julian >> started Gabapentin >> disequilibrium. She was on steroids then >> sugars higher. In 03/2015: spine steroid inj.  For last Thanksgiving, she cooked dinner for 30 families in need.  ROS: Constitutional: no weight gain/+ weight loss, no fatigue, no subjective hyperthermia, no subjective hypothermia Eyes: no blurry vision, no xerophthalmia ENT: no sore throat,  no nodules palpated in neck, no dysphagia, no odynophagia, no hoarseness Cardiovascular: no CP/no SOB/no palpitations/no leg swelling Respiratory: no cough/no SOB/no wheezing Gastrointestinal: no N/no V/no D/no C/no acid reflux Musculoskeletal: no muscle aches/no joint aches Skin: no rashes, no hair loss Neurological: no tremors/+ numbness/+ tingling/no dizziness  I reviewed pt's medications, allergies, PMH, social hx, family hx, and changes were documented in the history of present illness. Otherwise, unchanged from my initial visit note.   Past Medical History:  Diagnosis Date  .  Allergy   . Anxiety   . Arthritis   . Colitis    in her 64s; quiescent since  . DM (diabetes mellitus) ([place])   . HTN (hypertension)   . Hyperlipidemia    Past Surgical History:  Procedure Laterality Date  . CERVIX SURGERY  2001   Cryosurgery, Dr Connye Burkitt  . COLONOSCOPY  2009   Negative, Dr. Delfin Edis  . FOOT SURGERY     bone spurs resected, Dr Aida Puffer- 2019  . KNEE ARTHROSCOPY  1980   Right  . LAMINECTOMY  2007   X 2, Dr Gladstone Lighter   Social History   Social History  . Marital Status: Divorced    Spouse Name: N/A  . Number of Children: 1   Occupational History  . 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 Smoking status: Former Smoker    Quit date: 1996  . Alcohol Use: Yes     Comment:  rarely  . Drug Use: No   Current Outpatient Medications on File Prior to Visit  Medication Sig Dispense Refill  . atorvastatin (LIPITOR) 40 MG tablet TAKE ONE TABLET BY MOUTH DAILY AT 6 PM 90 tablet 1  . benazepril (LOTENSIN) 40 MG tablet Take 1 tablet (40 mg total) by mouth daily. 90 tablet 4  . Dulaglutide (TRULICITY) 3 NL/8.9QJ SOPN Inject 3 mg into the skin once a week. 12 pen 3  . ezetimibe (ZETIA) 10 MG tablet TAKE ONE TABLET BY MOUTH DAILY 90 tablet 1  . fluticasone (FLONASE) 50 MCG/ACT nasal spray Place 2 sprays into both nostrils daily. 16 g 6  . glipiZIDE (GLUCOTROL) 5 MG tablet TAKE ONE TABLET BY MOUTH DAILY 15-30 MINUTES BEFORE DINNER 30 tablet 1  . glucose blood (ONETOUCH VERIO) test strip Use to check blood sugar 1 time per day. Dx Code E11.9 100 each 12  . hydrochlorothiazide (HYDRODIURIL) 25 MG tablet TAKE 1/2 TABLET BY MOUTH BY MOUTH DAILY 15 tablet 4  . hyoscyamine (LEVSIN SL) 0.125 MG SL tablet Place 1 tablet (0.125 mg total) under the tongue every 4 (four) hours as needed. 30 tablet 0  . metFORMIN (GLUCOPHAGE) 1000 MG tablet TAKE ONE TABLET BY MOUTH TWICE A DAY 60 tablet 0  . metoprolol tartrate (LOPRESSOR) 50 MG tablet TAKE 1/2 TABLET BY MOUTH  TWICE A DAY 30 tablet 4   No current facility-administered medications on file prior to visit.   Allergies  Allergen Reactions  . Atorvastatin     REACTION: ELEVATES LFT'S. Pt states she is currently taking Atorvastatin 40 mg (1/2 tablet daily) and does not recall hab=ving any issues with it. (07/14/14)  . Codeine Nausea Only       . Neomycin-Bacitracin Zn-Polymyx Rash  . Onglyza [Saxagliptin Hydrochloride] Rash    This occurred after starting Kombiglyze . This was present as a rash over the hands and feet dorsally. She has been able to take metformin in the past as well as Janumet without problems.  Marland Kitchen  Sulfamethoxazole-Trimethoprim Nausea And Vomiting     Nausea was extreme.     Family History  Problem Relation Age of Onset  . Diabetes Father   . Hypertension Father   . Heart attack Father 31       precipitated by bleeding ulcer  . Diabetes Mother   . Hypertension Mother   . Coronary artery disease Mother   . Heart attack Paternal Grandfather        ? 79  . Heart attack Maternal Grandfather 68  . Diabetes Maternal Grandmother   . Stroke Maternal Grandmother 82  . Cancer Neg Hx   . Colon cancer Neg Hx   . Esophageal cancer Neg Hx   . Rectal cancer Neg Hx   . Stomach cancer Neg Hx    PE: BP 130/70   Pulse (!) 103   Ht 5' 9"  (1.753 m)   Wt 207 lb (93.9 kg)   SpO2 98%   BMI 30.57 kg/m  Body mass index is 30.57 kg/m.  Wt Readings from Last 3 Encounters:  11/13/19 207 lb (93.9 kg)  11/11/19 210 lb (95.3 kg)  09/09/19 210 lb (95.3 kg)   Constitutional: overweight, in NAD Eyes: PERRLA, EOMI, no exophthalmos ENT: moist mucous membranes, no thyromegaly, no cervical lymphadenopathy Cardiovascular: tachycardia, RR, No MRG Respiratory: CTA B Gastrointestinal: abdomen soft, NT, ND, BS+ Musculoskeletal: no deformities, strength intact in all 4 Skin: moist, warm, no rashes Neurological: no tremor with outstretched hands, DTR normal in all 4  ASSESSMENT: 1. DM2,  non-insulin-dependent, uncontrolled, with complications - PVD - CKD - DR  2. HL  3. Obesity class 1  PLAN:  1. Patient with longstanding, previously uncontrolled, type 2 diabetes, with significantly improved control after she started to improve her diet and be compliant with her diabetic medications.  At last visit, her sugars were still above goal in the morning and they were higher later in the day.  She was snacking frequently between meals and we discussed about the need to stop snacking.  We also increased Trulicity to 3 mg weekly.  We continued Metformin and glipizide at the same doses. -Since last visit, sugars continue to improve and she also lost weight.  Sugars are excellent at today's visit, but she had 2 low blood sugars >> we will start daily glipizide but I did advise her to take half of a tablet if she has a large meal. - I suggested to:  Patient Instructions  Please continue: - Metformin 2000 mg with dinner - Trulicity 3 mg weekly  Use Glipizide 2.5 mg before a larger meal.  Please return in 4 months with your sugar log.  - we checked her HbA1c: 6.1% (excellent improvement) - advised to check sugars at different times of the day - 1x a day, rotating check times - advised for yearly eye exams >> she is not UTD - return to clinic in 4 months -at that time, we may be able to decrease Metformin or Trulicity  2. HL -Reviewed latest lipid panel from 06/2019: LDL above target, the rest of the fractions at goal: Lab Results  Component Value Date   CHOL 167 07/08/2019   HDL 49.40 07/08/2019   LDLCALC 102 (H) 07/08/2019   LDLDIRECT 149.9 02/14/2010   TRIG 78.0 07/08/2019   CHOLHDL 3 07/08/2019  -Continues statin and ezetimibe without side effects.  3. Obesity class 1 -At last visit we increase Trulicity which should also help with weight loss -She lost 10 pounds since then!  Philemon Kingdom, MD PhD [facility] Endocrinology

## 2019-11-18 ENCOUNTER — Encounter: Payer: Self-pay | Admitting: Gastroenterology

## 2019-12-02 ENCOUNTER — Other Ambulatory Visit: Payer: Self-pay | Admitting: Internal Medicine

## 2019-12-13 ENCOUNTER — Other Ambulatory Visit: Payer: Self-pay | Admitting: Internal Medicine

## 2019-12-28 ENCOUNTER — Encounter: Payer: Self-pay | Admitting: Internal Medicine

## 2020-01-14 ENCOUNTER — Other Ambulatory Visit: Payer: Self-pay | Admitting: Internal Medicine

## 2020-02-05 ENCOUNTER — Other Ambulatory Visit: Payer: Self-pay | Admitting: Internal Medicine

## 2020-03-03 ENCOUNTER — Other Ambulatory Visit: Payer: Self-pay | Admitting: Internal Medicine

## 2020-03-03 DIAGNOSIS — I10 Essential (primary) hypertension: Secondary | ICD-10-CM

## 2020-03-13 ENCOUNTER — Other Ambulatory Visit: Payer: Self-pay | Admitting: Internal Medicine

## 2020-03-15 ENCOUNTER — Ambulatory Visit: Payer: 59 | Admitting: Internal Medicine

## 2020-03-23 ENCOUNTER — Other Ambulatory Visit: Payer: Self-pay | Admitting: Internal Medicine

## 2020-03-25 ENCOUNTER — Other Ambulatory Visit: Payer: Self-pay | Admitting: Internal Medicine

## 2020-03-31 ENCOUNTER — Encounter: Payer: Self-pay | Admitting: Internal Medicine

## 2020-04-07 ENCOUNTER — Other Ambulatory Visit: Payer: Self-pay | Admitting: Internal Medicine

## 2020-05-20 ENCOUNTER — Encounter: Payer: Self-pay | Admitting: Internal Medicine

## 2020-05-20 ENCOUNTER — Other Ambulatory Visit: Payer: Self-pay

## 2020-05-20 ENCOUNTER — Ambulatory Visit: Payer: 59 | Admitting: Internal Medicine

## 2020-05-20 VITALS — BP 132/78 | HR 102 | Ht 69.0 in | Wt 206.4 lb

## 2020-05-20 DIAGNOSIS — E1151 Type 2 diabetes mellitus with diabetic peripheral angiopathy without gangrene: Secondary | ICD-10-CM | POA: Diagnosis not present

## 2020-05-20 DIAGNOSIS — E7849 Other hyperlipidemia: Secondary | ICD-10-CM | POA: Diagnosis not present

## 2020-05-20 DIAGNOSIS — E669 Obesity, unspecified: Secondary | ICD-10-CM

## 2020-05-20 LAB — POCT GLYCOSYLATED HEMOGLOBIN (HGB A1C): Hemoglobin A1C: 6.3 % — AB (ref 4.0–5.6)

## 2020-05-20 NOTE — Addendum Note (Signed)
Addended by: Caprice Beaver T on: 05/20/2020 01:38 PM   Modules accepted: Orders

## 2020-05-20 NOTE — Patient Instructions (Addendum)
Please continue: - Metformin 2000 mg with dinner - Glipizide 2.5 mg before a larger meal - Trulicity 3 mg weekly  Please increase: - Atorvastatin to 40 mg daily  Please return in 4-6 months.

## 2020-05-20 NOTE — Progress Notes (Signed)
Patient ID: [name], female   DOB: [birth year], 64 y.o.   MRN: [id number]  This visit occurred during the SARS-CoV-2 public health emergency.  Safety protocols were in place, including screening questions prior to the visit, additional usage of staff PPE, and extensive cleaning of exam room while observing appropriate contact time as indicated for disinfecting solutions.   HPI: [name] is a pleasant 64 y.o.-year-old female, presenting for follow-up for DM2, dx in 2000, non-insulin-dependent, uncontrolled, with complications (PVD, CKD, DR).  Last visit 6 months ago.  Reviewed HbA1c levels: Lab Results  Component Value Date   HGBA1C 6.1 (A) 11/13/2019   HGBA1C 7.3 (A) 06/10/2019   HGBA1C 7.7 (H) 07/05/2018   Pt is on a regimen of: - Metformin 2000 mg with dinner - Glipizide 5 mg before dinner >> 2.5 mg before a larger dinner >> 5 mg before dinner - Trulicity 1.5 >> 3 mg weekly She was on Farxiga 10 mg daily in am >> stopped 08/2017 after mixup at the pharmacy. She was on [employer] stopped covering it. She was on Kombiglyze = Metformin + Onglyza (Saxagliptin) - 1000-2.5 mg daily >> rash.  Pt checks her sugars 0-1x a day: - am:  92, 108-161 >> 65, 81-111, 127 >> 81-100 - 2h after b'fast: 126 >> n/c >> 130, 218 >> n/c >> 130 - before lunch: n/c >> 138 >> n/c - 2h after lunch:  160-177 >> 170-175 >> n/c >>180 - before dinner: 208 >> n/c >> 120-128 >> n/c - 2h after dinner: n/c - bedtime: n/c - nighttime: n/c Lowest sugar was: 92 >> 65 x2 >> 80; she has hypoglycemia awareness in the 60s. Highest sugar was: 218 >> 127 >> 180.  Glucometer: One Touch Ultra  -+ Mild CKD, last BUN/creatinine:  Lab Results  Component Value Date   BUN 22 07/08/2019   CREATININE 1.25 (H) 07/08/2019   Lab Results  Component Value Date   GFRAA 85 04/22/2008   GFRAA 85 11/22/2006   Lab Results  Component Value Date   MICRALBCREAT 0.7 06/07/2015   MICRALBCREAT 0.8 02/03/2015    MICRALBCREAT 0.5 06/05/2014   MICRALBCREAT 0.4 04/05/2012   MICRALBCREAT 0.6 11/01/2010   MICRALBCREAT 0.4 02/14/2010   MICRALBCREAT 5.1 04/22/2008   MICRALBCREAT 3.0 11/22/2006  On benazepril.  -+ HL. Last set of lipids: Lab Results  Component Value Date   CHOL 167 07/08/2019   HDL 49.40 07/08/2019   LDLCALC 102 (H) 07/08/2019   LDLDIRECT 149.9 02/14/2010   TRIG 78.0 07/08/2019   CHOLHDL 3 07/08/2019  On Lipitor 40 and Zetia 10. - last eye exam was in 08/2018: At that time she had hemorrhage in the left eye -+ Numbness and tingling in her feet developed after spine surgery  She also has HTN, Had back surgery 2007. Back pain increased 02/2015 >> saw Dr Tamala Julian >> started Gabapentin >> disequilibrium. She was on steroids then >> sugars higher. In 03/2015: spine steroid inj.  For Thanksgiving 2020, she cooked dinner for 30 families in need.  ROS: Constitutional: no weight gain/no weight loss, no fatigue, no subjective hyperthermia, no subjective hypothermia Eyes: no blurry vision, no xerophthalmia ENT: no sore throat, no nodules palpated in neck, no dysphagia, no odynophagia, no hoarseness Cardiovascular: no CP/no SOB/no palpitations/no leg swelling Respiratory: no cough/no SOB/no wheezing Gastrointestinal: no N/no V/no D/no C/no acid reflux Musculoskeletal: no muscle aches/no joint aches Skin: no rashes, no hair loss Neurological: no tremors/+ numbness/+ tingling/no dizziness  I reviewed pt's medications, allergies, PMH, social hx, family hx, and changes were documented in the history of present illness. Otherwise, unchanged from my initial visit note.  Past Medical History:  Diagnosis Date   Allergy    Anxiety    Arthritis    Colitis    in her 48s; quiescent since   DM (diabetes mellitus) ([place])    HTN (hypertension)    Hyperlipidemia    Past Surgical History:  Procedure Laterality Date   CERVIX SURGERY  2001   Cryosurgery, Dr Connye Burkitt   COLONOSCOPY  2009    Negative, Dr. Delfin Edis   FOOT SURGERY     bone spurs resected, Dr Aida Puffer- 2019   KNEE ARTHROSCOPY  1980   Right   LAMINECTOMY  2007   X 2, Dr Gladstone Lighter   Social History   Social History   Marital Status: Divorced    Spouse Name: N/A   Number of Children: 1   Occupational History   [employer]   Social History Main Topics   Smoking status: Former Smoker    Quit date: 1996   Alcohol Use: Yes     Comment:  rarely   Drug Use: No   Current Outpatient Medications on File Prior to Visit  Medication Sig Dispense Refill   atorvastatin (LIPITOR) 40 MG tablet TAKE ONE TABLET BY MOUTH DAILY AT 6 PM 90 tablet 1   benazepril (LOTENSIN) 40 MG tablet Take 1 tablet (40 mg total) by mouth daily. 90 tablet 4   ezetimibe (ZETIA) 10 MG tablet Take 1 tablet (10 mg total) by mouth daily. Annual appt due in Dec must see provider for future refills 30 tablet 4   fluticasone (FLONASE) 50 MCG/ACT nasal spray Place 2 sprays into both nostrils daily. 16 g 6   glipiZIDE (GLUCOTROL) 5 MG tablet TAKE 1 TABLET BY MOUTH BEFORE DINNER 30 tablet 1   glucose blood (ONETOUCH VERIO) test strip Use to check blood sugar 1 time per day. Dx Code E11.9 100 each 12   hydrochlorothiazide (HYDRODIURIL) 25 MG tablet TAKE 1/2 TABLET BY MOUTH DAILY 15 tablet 4   hyoscyamine (LEVSIN SL) 0.125 MG SL tablet Place 1 tablet (0.125 mg total) under the tongue every 4 (four) hours as needed. 30 tablet 0   metFORMIN (GLUCOPHAGE) 1000 MG tablet TAKE ONE TABLET BY MOUTH TWICE A DAY 60 tablet 2   metoprolol tartrate (LOPRESSOR) 50 MG tablet TAKE 1/2 TABLET BY MOUTH TWICE A DAY 30 tablet 4   TRULICITY 3 RX/4.5OP SOPN INJECT 3 MG INTO THE SKIN ONCE A WEEK. 6 mL 3   No current facility-administered medications on file prior to visit.   Allergies  Allergen Reactions   Atorvastatin     REACTION: ELEVATES LFT'S. Pt states she is currently taking Atorvastatin 40 mg (1/2 tablet daily) and does  not recall hab=ving any issues with it. (07/14/14)   Codeine Nausea Only        Neomycin-Bacitracin Zn-Polymyx Rash   Onglyza [Saxagliptin Hydrochloride] Rash    This occurred after starting Kombiglyze . This was present as a rash over the hands and feet dorsally. She has been able to take metformin in the past as well as Janumet without problems.   Sulfamethoxazole-Trimethoprim Nausea And Vomiting     Nausea was extreme.     Family History  Problem Relation Age of Onset   Diabetes Father    Hypertension Father    Heart attack Father 51  precipitated by bleeding ulcer   Diabetes Mother    Hypertension Mother    Coronary artery disease Mother    Heart attack Paternal Grandfather        ? 13   Heart attack Maternal Grandfather 68   Diabetes Maternal Grandmother    Stroke Maternal Grandmother 82   Cancer Neg Hx    Colon cancer Neg Hx    Esophageal cancer Neg Hx    Rectal cancer Neg Hx    Stomach cancer Neg Hx    PE: BP 140/70    Pulse (!) 102    Ht 5' 9"  (1.753 m)    Wt 206 lb 6.4 oz (93.6 kg)    SpO2 99%    BMI 30.48 kg/m  Body mass index is 30.48 kg/m.  Wt Readings from Last 3 Encounters:  05/20/20 206 lb 6.4 oz (93.6 kg)  11/13/19 207 lb (93.9 kg)  11/11/19 210 lb (95.3 kg)   Constitutional: overweight, in NAD Eyes: PERRLA, EOMI, no exophthalmos ENT: moist mucous membranes, no thyromegaly, no cervical lymphadenopathy Cardiovascular: tachycardia, RR, No MRG Respiratory: CTA B Gastrointestinal: abdomen soft, NT, ND, BS+ Musculoskeletal: no deformities, strength intact in all 4 Skin: moist, warm, no rashes Neurological: no tremor with outstretched hands, DTR normal in all 4  ASSESSMENT: 1. DM2, non-insulin-dependent, uncontrolled, with complications - PVD - CKD - DR  2. HL  3. Obesity class 1  PLAN:  1. Patient with longstanding, previously uncontrolled type 2 diabetes, with significant improvement of control after she started to  improve her diet and be compliant with her diabetic medications.  At last visit, sugars continued to improve and she was also losing weight.  She will have 2 low blood sugars so we stopped the daily glipizide and I advised her to only take half a tablet before a larger meal.  HbA1c at that time was excellent, at 6.1%. -At today's visit, sugars remain very well controlled in the morning.  However, she tells me that since last visit she increased the dose of glipizide back to 5 mg before dinner as her sugars are higher in the morning when she decrease the dose.  As of now, she is almost exclusively checking blood sugars in the morning and I advised her to also check some sugars later in the day.  However, I do not feel we need to change the regimen for now.  Of note, she has no more low blood sugars. - I suggested to:  Patient Instructions  Please continue: - Metformin 2000 mg with dinner - Glipizide 5 mg before dinner - Trulicity 3 mg weekly  Please return in 4-6 months with your sugar log.  - we checked her HbA1c: 6.3% (slightly higher) - advised to check sugars at different times of the day - 1x a day, rotating check times - advised for yearly eye exams >> she is UTD - return to clinic in 4-6 months  2. HL -Reviewed latest lipid panel from 06/2019: LDL above target of <70, the rest of the fractions at goal: Lab Results  Component Value Date   CHOL 167 07/08/2019   HDL 49.40 07/08/2019   LDLCALC 102 (H) 07/08/2019   LDLDIRECT 149.9 02/14/2010   TRIG 78.0 07/08/2019   CHOLHDL 3 07/08/2019  -on Lipitor 20 (actually taking 1/2 of a 40 mg tablet...) and Zetia 10.  We reviewed together the above lipid, and I advised that the goal LDL is less than 70 for her.  We will increase  the dose of Lipitor to 40 mg daily and she does have an appointment in 07/2020 with PCP  3. Obesity class 1 -We will continue Trulicity which should also help with weight loss -She lost 10 pounds before last visit,  weight stable since last OV  Philemon Kingdom, MD PhD [facility]

## 2020-05-25 ENCOUNTER — Other Ambulatory Visit: Payer: Self-pay | Admitting: Internal Medicine

## 2020-07-16 ENCOUNTER — Other Ambulatory Visit: Payer: Self-pay | Admitting: Internal Medicine

## 2020-07-19 ENCOUNTER — Encounter: Payer: 59 | Admitting: Internal Medicine

## 2020-07-28 ENCOUNTER — Other Ambulatory Visit: Payer: Self-pay | Admitting: Internal Medicine

## 2020-08-10 ENCOUNTER — Other Ambulatory Visit: Payer: Self-pay | Admitting: Internal Medicine

## 2020-08-10 DIAGNOSIS — I10 Essential (primary) hypertension: Secondary | ICD-10-CM

## 2020-08-17 ENCOUNTER — Other Ambulatory Visit: Payer: Self-pay | Admitting: Internal Medicine

## 2020-08-25 ENCOUNTER — Encounter: Payer: Self-pay | Admitting: Internal Medicine

## 2020-08-25 ENCOUNTER — Other Ambulatory Visit: Payer: Self-pay

## 2020-08-25 DIAGNOSIS — E669 Obesity, unspecified: Secondary | ICD-10-CM | POA: Insufficient documentation

## 2020-08-25 NOTE — Patient Instructions (Addendum)
Blood work was ordered.     Medications changes include :   Trazodone 25-50 mg at night  Your prescription(s) have been submitted to your pharmacy.    A referral was ordered for [facility].   Someone will call you to schedule an appointment.    Please followup in 6 months    Health Maintenance, Female Adopting a healthy lifestyle and getting preventive care are important in promoting health and wellness. Ask your health care provider about:  The right schedule for you to have regular tests and exams.  Things you can do on your own to prevent diseases and keep yourself healthy. What should I know about diet, weight, and exercise? Eat a healthy diet  Eat a diet that includes plenty of vegetables, fruits, low-fat dairy products, and lean protein.  Do not eat a lot of foods that are high in solid fats, added sugars, or sodium.   Maintain a healthy weight Body mass index (BMI) is used to identify weight problems. It estimates body fat based on height and weight. Your health care provider can help determine your BMI and help you achieve or maintain a healthy weight. Get regular exercise Get regular exercise. This is one of the most important things you can do for your health. Most adults should:  Exercise for at least 150 minutes each week. The exercise should increase your heart rate and make you sweat (moderate-intensity exercise).  Do strengthening exercises at least twice a week. This is in addition to the moderate-intensity exercise.  Spend less time sitting. Even light physical activity can be beneficial. Watch cholesterol and blood lipids Have your blood tested for lipids and cholesterol at 65 years of age, then have this test every 5 years. Have your cholesterol levels checked more often if:  Your lipid or cholesterol levels are high.  You are older than 65 years of age.  You are at high risk for heart disease. What should I know about cancer  screening? Depending on your health history and family history, you may need to have cancer screening at various ages. This may include screening for:  Breast cancer.  Cervical cancer.  Colorectal cancer.  Skin cancer.  Lung cancer. What should I know about heart disease, diabetes, and high blood pressure? Blood pressure and heart disease  High blood pressure causes heart disease and increases the risk of stroke. This is more likely to develop in people who have high blood pressure readings, are of African descent, or are overweight.  Have your blood pressure checked: ? Every 3-5 years if you are 58-39 years of age. ? Every year if you are 54 years old or older. Diabetes Have regular diabetes screenings. This checks your fasting blood sugar level. Have the screening done:  Once every three years after age 79 if you are at a normal weight and have a low risk for diabetes.  More often and at a younger age if you are overweight or have a high risk for diabetes. What should I know about preventing infection? Hepatitis B If you have a higher risk for hepatitis B, you should be screened for this virus. Talk with your health care provider to find out if you are at risk for hepatitis B infection. Hepatitis C Testing is recommended for:  Everyone born from [birth year] through 1965.  Anyone with known risk factors for hepatitis C. Sexually transmitted infections (STIs)  Get screened for STIs, including gonorrhea and chlamydia, if: ? You are sexually active and  are younger than 65 years of age. ? You are older than 65 years of age and your health care provider tells you that you are at risk for this type of infection. ? Your sexual activity has changed since you were last screened, and you are at increased risk for chlamydia or gonorrhea. Ask your health care provider if you are at risk.  Ask your health care provider about whether you are at high risk for HIV. Your health care provider may  recommend a prescription medicine to help prevent HIV infection. If you choose to take medicine to prevent HIV, you should first get tested for HIV. You should then be tested every 3 months for as long as you are taking the medicine. Pregnancy  If you are about to stop having your period (premenopausal) and you may become pregnant, seek counseling before you get pregnant.  Take 400 to 800 micrograms (mcg) of folic acid every day if you become pregnant.  Ask for birth control (contraception) if you want to prevent pregnancy. Osteoporosis and menopause Osteoporosis is a disease in which the bones lose minerals and strength with aging. This can result in bone fractures. If you are 83 years old or older, or if you are at risk for osteoporosis and fractures, ask your health care provider if you should:  Be screened for bone loss.  Take a calcium or vitamin D supplement to lower your risk of fractures.  Be given hormone replacement therapy (HRT) to treat symptoms of menopause. Follow these instructions at home: Lifestyle  Do not use any products that contain nicotine or tobacco, such as cigarettes, e-cigarettes, and chewing tobacco. If you need help quitting, ask your health care provider.  Do not use street drugs.  Do not share needles.  Ask your health care provider for help if you need support or information about quitting drugs. Alcohol use  Do not drink alcohol if: ? Your health care provider tells you not to drink. ? You are pregnant, may be pregnant, or are planning to become pregnant.  If you drink alcohol: ? Limit how much you use to 0-1 drink a day. ? Limit intake if you are breastfeeding.  Be aware of how much alcohol is in your drink. In the [place] one drink equals one 12 oz bottle of beer (355 mL), one 5 oz glass of wine (148 mL), or one 1 oz glass of hard liquor (44 mL). General instructions  Schedule regular health, dental, and eye exams.  Stay current with your  vaccines.  Tell your health care provider if: ? You often feel depressed. ? You have ever been abused or do not feel safe at home. Summary  Adopting a healthy lifestyle and getting preventive care are important in promoting health and wellness.  Follow your health care provider's instructions about healthy diet, exercising, and getting tested or screened for diseases.  Follow your health care provider's instructions on monitoring your cholesterol and blood pressure. This information is not intended to replace advice given to you by your health care provider. Make sure you discuss any questions you have with your health care provider. Document Revised: 06/26/2018 Document Reviewed: 06/26/2018 Elsevier Patient Education  2021 [employer].

## 2020-08-25 NOTE — Progress Notes (Signed)
Subjective:    Patient ID: [name], female    DOB: April 23, 1956, 65 y.o.   MRN: [id number]   This visit occurred during the SARS-CoV-2 public health emergency.  Safety protocols were in place, including screening questions prior to the visit, additional usage of staff PPE, and extensive cleaning of exam room while observing appropriate contact time as indicated for disinfecting solutions.    HPI She is here for a physical exam.   Hair loss over the past 5-6 years - now much worse - area is a large circular area on top of her head.she also has some diffuse hair loss that is chronic.  Medications and allergies reviewed with patient and updated if appropriate.  Patient Active Problem List   Diagnosis Date Noted  . Sleeping difficulty 08/26/2020  . Alopecia areata 08/26/2020  . Esophageal spasm 08/26/2020  . Thrombocytopenia ([place]) 07/13/2019  . Fatty liver 07/16/2016  . Lumbar back pain with radiculopathy affecting left lower extremity 02/22/2015  . PVC's (premature ventricular contractions) 06/09/2014  . Essential hypertension 02/14/2010  . Type 2 diabetes mellitus with peripheral vascular disease ([place]) 11/21/2006  . Hyperlipidemia 11/21/2006    Current Outpatient Medications on File Prior to Visit  Medication Sig Dispense Refill  . atorvastatin (LIPITOR) 40 MG tablet TAKE ONE TABLET BY MOUTH DAILY AT 6PM 30 tablet 1  . benazepril (LOTENSIN) 40 MG tablet TAKE ONE TABLET BY MOUTH DAILY 90 tablet 3  . ezetimibe (ZETIA) 10 MG tablet TAKE 1 TABLET BY MOUTH DAILY 30 tablet 4  . fluticasone (FLONASE) 50 MCG/ACT nasal spray Place 2 sprays into both nostrils daily. 16 g 6  . glipiZIDE (GLUCOTROL) 5 MG tablet TAKE ONE TABLET BY MOUTH DAILY BEFORE DINNER 30 tablet 1  . glucose blood (ONETOUCH VERIO) test strip Use to check blood sugar 1 time per day. Dx Code E11.9 100 each 12  . hydrochlorothiazide (HYDRODIURIL) 25 MG tablet TAKE 1/2 TABLET BY MOUTH DAILY 15 tablet 4  . ibuprofen  (ADVIL) 600 MG tablet Take 600 mg by mouth 3 (three) times daily.    . metFORMIN (GLUCOPHAGE) 1000 MG tablet TAKE ONE TABLET BY MOUTH TWICE A DAY 60 tablet 2  . metoprolol tartrate (LOPRESSOR) 50 MG tablet TAKE 1/2 TABLET BY MOUTH TWICE A DAY 30 tablet 4  . TRULICITY 3 YY/5.0PT SOPN INJECT 3 MG INTO THE SKIN ONCE A WEEK. 6 mL 3   No current facility-administered medications on file prior to visit.    Past Medical History:  Diagnosis Date  . Allergy   . Anxiety   . Arthritis   . Colitis    in her 46s; quiescent since  . DM (diabetes mellitus) ([place])   . HTN (hypertension)   . Hyperlipidemia     Past Surgical History:  Procedure Laterality Date  . CERVIX SURGERY  2001   Cryosurgery, Dr Connye Burkitt  . COLONOSCOPY  2009   Negative, Dr. Delfin Edis  . FOOT SURGERY     bone spurs resected, Dr Aida Puffer- 2019  . KNEE ARTHROSCOPY  1980   Right  . LAMINECTOMY  2007   X 2, Dr Gladstone Lighter    Social History   Socioeconomic History  . Marital status: Divorced    Spouse name: Not on file  . Number of children: Not on file  . Years of education: Not on file  . Highest education level: Not on file  Occupational History  . Not on file  Tobacco Use  . Smoking status:  Former Smoker    Quit date: 07/17/1977    Years since quitting: 43.1  . Smokeless tobacco: Never Used  . Tobacco comment: Smoked in [place] ; [phone], up to 1/2 ppd  Vaping Use  . Vaping Use: Never used  Substance and Sexual Activity  . Alcohol use: Yes    Comment:  rarely  . Drug use: No  . Sexual activity: Not on file  Other Topics Concern  . Not on file  Social History Narrative  . Not on file   Social Determinants of Health   Financial Resource Strain: Not on file  Food Insecurity: Not on file  Transportation Needs: Not on file  Physical Activity: Not on file  Stress: Not on file  Social Connections: Not on file    Family History  Problem Relation Age of Onset  . Diabetes Father   . Hypertension  Father   . Heart attack Father 65       precipitated by bleeding ulcer  . Diabetes Mother   . Hypertension Mother   . Coronary artery disease Mother   . Heart attack Paternal Grandfather        ? 42  . Heart attack Maternal Grandfather 68  . Diabetes Maternal Grandmother   . Stroke Maternal Grandmother 82  . Cancer Neg Hx   . Colon cancer Neg Hx   . Esophageal cancer Neg Hx   . Rectal cancer Neg Hx   . Stomach cancer Neg Hx     Review of Systems  Constitutional: Negative for chills and fever.  Eyes: Negative for visual disturbance.  Respiratory: Negative for cough, shortness of breath and wheezing.   Cardiovascular: Negative for chest pain, palpitations and leg swelling.  Gastrointestinal: Positive for abdominal pain (occ - for years - epigastric spasms - very painful). Negative for blood in stool, constipation, diarrhea and nausea.       No gerd  Genitourinary: Negative for dysuria.  Musculoskeletal: Positive for back pain (chronic - s/p 2 surgeries).  Skin:       Hair loss  Neurological: Negative for light-headedness and headaches.  Psychiatric/Behavioral: Positive for dysphoric mood and sleep disturbance. The patient is nervous/anxious.        Objective:   Vitals:   08/26/20 0954  BP: 124/78  Pulse: 98  Temp: 98.1 F (36.7 C)  SpO2: 99%   Filed Weights   08/26/20 0954  Weight: 200 lb (90.7 kg)   Body mass index is 29.53 kg/m.  BP Readings from Last 3 Encounters:  08/26/20 124/78  05/20/20 132/78  11/13/19 130/70    Wt Readings from Last 3 Encounters:  08/26/20 200 lb (90.7 kg)  05/20/20 206 lb 6.4 oz (93.6 kg)  11/13/19 207 lb (93.9 kg)   Depression screen [facility] 2/9 08/26/2020 07/08/2019 07/04/2017 06/02/2013  Decreased Interest 1 0 0 0  Down, Depressed, Hopeless 2 0 0 0  PHQ - 2 Score 3 0 0 0  Altered sleeping 3 - - -  Tired, decreased energy 1 - - -  Change in appetite 3 - - -  Feeling bad or failure about yourself  0 - - -  Trouble  concentrating 0 - - -  Moving slowly or fidgety/restless 0 - - -  Suicidal thoughts 0 - - -  PHQ-9 Score 10 - - -  Difficult doing work/chores Somewhat difficult - - -  Some recent data might be hidden      Physical Exam Constitutional: She appears well-developed and  well-nourished. No distress.  HENT:  Head: Normocephalic and atraumatic.  Right Ear: External ear normal. Normal ear canal and TM Left Ear: External ear normal.  Normal ear canal and TM Mouth/Throat: Oropharynx is clear and moist.  Eyes: Conjunctivae and EOM are normal.  Neck: Neck supple. No tracheal deviation present. No thyromegaly present.  No carotid bruit  Cardiovascular: Normal rate, regular rhythm and normal heart sounds.   No murmur heard.  No edema. Pulmonary/Chest: Effort normal and breath sounds normal. No respiratory distress. She has no wheezes. She has no rales.  Breast: deferred   Abdominal: Soft. She exhibits no distension. There is no tenderness.  Lymphadenopathy: She has no cervical adenopathy.  Skin: Skin is warm and dry. She is not diaphoretic.  Psychiatric: She has a normal mood and affect. Her behavior is normal.   Diabetic Foot Exam - Simple   Simple Foot Form Diabetic Foot exam was performed with the following findings: Yes 08/26/2020 10:55 AM  Visual Inspection No deformities, no ulcerations, no other skin breakdown bilaterally: Yes Sensation Testing See comments: Yes Pulse Check Posterior Tibialis and Dorsalis pulse intact bilaterally: Yes Comments Decreased sensation L plantar surface ( back surgery ), normal sensation R foot         Assessment & Plan:   Physical exam: Screening blood work    ordered Immunizations  had covid x 3, others Up to date  Colonoscopy  Up to date - due 10/2020 Mammogram  scheduled Gyn  scheduled Eye exams  Due - scheduled Exercise  Walks some, will start riding bike in warmer weather Weight  Has lost some weight - has cut down on sweets and calories,  encouraged continued weight loss Substance abuse  none  There is some element of depression and anxiety.  She has sleep issues and we will treat that - that is likely make her anxiety /depression worse.  We will hold off on treatment of her anxiety/depression for now.     See Problem List for Assessment and Plan of chronic medical problems.

## 2020-08-26 ENCOUNTER — Ambulatory Visit (INDEPENDENT_AMBULATORY_CARE_PROVIDER_SITE_OTHER): Payer: 59 | Admitting: Internal Medicine

## 2020-08-26 VITALS — BP 124/78 | HR 98 | Temp 98.1°F | Ht 69.0 in | Wt 200.0 lb

## 2020-08-26 DIAGNOSIS — F32A Depression, unspecified: Secondary | ICD-10-CM | POA: Insufficient documentation

## 2020-08-26 DIAGNOSIS — D696 Thrombocytopenia, unspecified: Secondary | ICD-10-CM

## 2020-08-26 DIAGNOSIS — E1151 Type 2 diabetes mellitus with diabetic peripheral angiopathy without gangrene: Secondary | ICD-10-CM

## 2020-08-26 DIAGNOSIS — G479 Sleep disorder, unspecified: Secondary | ICD-10-CM

## 2020-08-26 DIAGNOSIS — E7849 Other hyperlipidemia: Secondary | ICD-10-CM | POA: Diagnosis not present

## 2020-08-26 DIAGNOSIS — K760 Fatty (change of) liver, not elsewhere classified: Secondary | ICD-10-CM

## 2020-08-26 DIAGNOSIS — E669 Obesity, unspecified: Secondary | ICD-10-CM

## 2020-08-26 DIAGNOSIS — F419 Anxiety disorder, unspecified: Secondary | ICD-10-CM

## 2020-08-26 DIAGNOSIS — I10 Essential (primary) hypertension: Secondary | ICD-10-CM | POA: Diagnosis not present

## 2020-08-26 DIAGNOSIS — L639 Alopecia areata, unspecified: Secondary | ICD-10-CM | POA: Insufficient documentation

## 2020-08-26 DIAGNOSIS — Z0000 Encounter for general adult medical examination without abnormal findings: Secondary | ICD-10-CM

## 2020-08-26 DIAGNOSIS — K224 Dyskinesia of esophagus: Secondary | ICD-10-CM

## 2020-08-26 LAB — CBC WITH DIFFERENTIAL/PLATELET
Basophils Absolute: 0 10*3/uL (ref 0.0–0.1)
Basophils Relative: 0.6 % (ref 0.0–3.0)
Eosinophils Absolute: 0.4 10*3/uL (ref 0.0–0.7)
Eosinophils Relative: 5.3 % — ABNORMAL HIGH (ref 0.0–5.0)
HCT: 40.4 % (ref 36.0–46.0)
Hemoglobin: 13.7 g/dL (ref 12.0–15.0)
Lymphocytes Relative: 26.7 % (ref 12.0–46.0)
Lymphs Abs: 1.8 10*3/uL (ref 0.7–4.0)
MCHC: 33.9 g/dL (ref 30.0–36.0)
MCV: 91.6 fl (ref 78.0–100.0)
Monocytes Absolute: 0.5 10*3/uL (ref 0.1–1.0)
Monocytes Relative: 7.1 % (ref 3.0–12.0)
Neutro Abs: 4.1 10*3/uL (ref 1.4–7.7)
Neutrophils Relative %: 60.3 % (ref 43.0–77.0)
Platelets: 108 10*3/uL — ABNORMAL LOW (ref 150.0–400.0)
RBC: 4.41 Mil/uL (ref 3.87–5.11)
RDW: 13 % (ref 11.5–15.5)
WBC: 6.8 10*3/uL (ref 4.0–10.5)

## 2020-08-26 LAB — LIPID PANEL
Cholesterol: 148 mg/dL (ref 0–200)
HDL: 48.4 mg/dL (ref >39.00)
LDL Cholesterol: 82 mg/dL (ref 0–99)
NonHDL: 99.97
Total CHOL/HDL Ratio: 3
Triglycerides: 88 mg/dL (ref 0.0–149.0)
VLDL: 17.6 mg/dL (ref 0.0–40.0)

## 2020-08-26 LAB — COMPREHENSIVE METABOLIC PANEL
ALT: 30 U/L (ref 0–35)
AST: 43 U/L — ABNORMAL HIGH (ref 0–37)
Albumin: 4.4 g/dL (ref 3.5–5.2)
Alkaline Phosphatase: 91 U/L (ref 39–117)
BUN: 33 mg/dL — ABNORMAL HIGH (ref 6–23)
CO2: 25 mEq/L (ref 19–32)
Calcium: 9.9 mg/dL (ref 8.4–10.5)
Chloride: 106 mEq/L (ref 96–112)
Creatinine, Ser: 1.28 mg/dL — ABNORMAL HIGH (ref 0.40–1.20)
GFR: 44.22 mL/min — ABNORMAL LOW (ref >60.00)
Glucose, Bld: 97 mg/dL (ref 70–99)
Potassium: 4.2 mEq/L (ref 3.5–5.1)
Sodium: 142 mEq/L (ref 135–145)
Total Bilirubin: 1.5 mg/dL — ABNORMAL HIGH (ref 0.2–1.2)
Total Protein: 7.9 g/dL (ref 6.0–8.3)

## 2020-08-26 LAB — TSH: TSH: 0.94 u[IU]/mL (ref 0.35–4.50)

## 2020-08-26 LAB — MICROALBUMIN / CREATININE URINE RATIO
Creatinine,U: 168.8 mg/dL
Microalb Creat Ratio: 0.8 mg/g (ref 0.0–30.0)
Microalb, Ur: 1.3 mg/dL (ref 0.0–1.9)

## 2020-08-26 MED ORDER — TRAZODONE HCL 50 MG PO TABS: 25.0000 mg | ORAL_TABLET | Freq: Every evening | ORAL | 3 refills | Status: DC | PRN

## 2020-08-26 NOTE — Assessment & Plan Note (Signed)
Chronic Has had sleep issues for a while Does affect many things - anxiety, depression, quality of life Trial of trazodone 25-50 mg nightly - can titrate if needed

## 2020-08-26 NOTE — Assessment & Plan Note (Signed)
Chronic Check lipid panel, cmp, tsh  Continue atorvastatin 40 mg daily and zetia 10 mg daily Regular exercise, weight loss and healthy diet encouraged

## 2020-08-26 NOTE — Assessment & Plan Note (Signed)
Top of head Also has chronic diffuse hair loss Refer to derm

## 2020-08-26 NOTE — Assessment & Plan Note (Addendum)
Chronic cmp today Working on weight loss

## 2020-08-26 NOTE — Assessment & Plan Note (Addendum)
Chronic Discussed importance of weight loss She has lost weight and will continue her efforts Stressed regular exercise - goal 30 minutes 5 days a week Discussed decreasing portions, decreasing sugars/carbs Increase veges, lean protin tsh

## 2020-08-26 NOTE — Assessment & Plan Note (Signed)
Chronic BP well controlled Continue benazepril 40 mg daily, hctz 12.5 mg daily, metoprolol 25 mg BID cmp

## 2020-08-26 NOTE — Assessment & Plan Note (Signed)
Acute Has some anxiety and depression - some is situational - this past year has been tough Discussed possible treatment Her sleep is not great and that is likely compounding her anxiety and depression We will work on improving her sleep If anxiety/depression do not improve she will consider treatment for that.

## 2020-08-26 NOTE — Assessment & Plan Note (Signed)
Chronic Intermittent Epigastric pain - every 2-6 months, can last for hours

## 2020-08-26 NOTE — Assessment & Plan Note (Addendum)
Chronic Following with and managed by Dr Cruzita Lederer  Lab Results  Component Value Date   HGBA1C 6.3 (A) 05/20/2020    Well controlled Will check urine microalbumin

## 2020-08-26 NOTE — Assessment & Plan Note (Signed)
Chronic  cbc

## 2020-09-27 ENCOUNTER — Encounter: Payer: Self-pay | Admitting: Internal Medicine

## 2020-09-27 ENCOUNTER — Other Ambulatory Visit: Payer: Self-pay | Admitting: Internal Medicine

## 2020-10-15 ENCOUNTER — Other Ambulatory Visit: Payer: Self-pay | Admitting: Internal Medicine

## 2020-11-13 ENCOUNTER — Other Ambulatory Visit: Payer: Self-pay | Admitting: Internal Medicine

## 2020-11-18 ENCOUNTER — Ambulatory Visit: Payer: 59 | Admitting: Internal Medicine

## 2020-12-02 NOTE — Progress Notes (Signed)
[facility] [place] Phone: [phone] Subjective:   Fontaine No, am serving as a scribe for Dr. Hulan Saas. This visit occurred during the SARS-CoV-2 public health emergency.  Safety protocols were in place, including screening questions prior to the visit, additional usage of staff PPE, and extensive cleaning of exam room while observing appropriate contact time as indicated for disinfecting solutions.   I'm seeing this patient by the request  of:  Binnie Rail, MD  CC: Back pain with radiation  EGB:[id number]  [name] is a 65 y.o. female coming in with complaint of back pain that is radiating into her L hip for past 2 weeks. Pain in lumbar spine that wraps around to front of L hip. Lying down increases her pain and thus is having difficultly sleeping. Pain stabbing in character. History of back surgery in 2007. Takes Tylenol prn.    Patient did have an MRI of the lumbar spine in 2016.  This was independently visualized by me showing the patient did have advanced degenerative changes of the lumbar spine.  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 Patient did have a selective nerve root block and transforaminal epidural injection on the left L4 side in September 2016  Past medical history significant for laminectomy in 2007  Patient did get x-rays today.  Overread is pending but these were independently visualized by me showing the patient does have some progression of degenerative disc disease from L3-L5 with foraminal narrowing at all levels as well.  Patient does have sclerotic changes noted at L4-L5 and minorly at L3-4.  Past Medical History:  Diagnosis  Date  . Allergy   . Anxiety   . Arthritis   . Colitis    in her 44s; quiescent since  . DM (diabetes mellitus) ([place])   . HTN (hypertension)   . Hyperlipidemia    Past Surgical History:  Procedure Laterality Date  . CERVIX SURGERY  2001   Cryosurgery, Dr Connye Burkitt  . COLONOSCOPY  2009   Negative, Dr. Delfin Edis  . FOOT SURGERY     bone spurs resected, Dr Aida Puffer- 2019  . KNEE ARTHROSCOPY  1980   Right  . LAMINECTOMY  2007   X 2, Dr Gladstone Lighter   Social History   Socioeconomic History  . Marital status: Divorced    Spouse name: Not on file  . Number of children: Not on file  . Years of education: Not on file  . Highest education level: Not on file  Occupational History  . Not on file  Tobacco Use  . Smoking status: Former Smoker    Quit date: 07/17/1977    Years since quitting: 43.4  . Smokeless tobacco: Never Used  . Tobacco comment: Smoked in [place] ; [phone], up to 1/2 ppd  Vaping Use  . Vaping Use: Never used  Substance and Sexual Activity  . Alcohol use: Yes    Comment:  rarely  . Drug use: No  . Sexual activity: Not on file  Other Topics Concern  . Not on file  Social History Narrative  . Not on file   Social Determinants of Health   Financial Resource Strain: Not on file  Food Insecurity: Not on file  Transportation Needs: Not on file  Physical Activity: Not on file  Stress: Not on file  Social Connections: Not on file   Allergies  Allergen Reactions  . Atorvastatin     REACTION: ELEVATES LFT'S. Pt states she is currently taking Atorvastatin 40 mg (1/2 tablet daily) and does not recall hab=ving any issues with it. (07/14/14)  . Tramadol Nausea And Vomiting  . Codeine Nausea Only       . Neomycin-Bacitracin Zn-Polymyx Rash  . Onglyza [Saxagliptin Hydrochloride] Rash    This occurred after starting Kombiglyze . This was present as a rash over the hands and feet dorsally. She has been able to take metformin in the past as well as Janumet without  problems.  . Sulfamethoxazole-Trimethoprim Nausea And Vomiting     Nausea was extreme.     Family History  Problem Relation Age of Onset  . Diabetes Father   . Hypertension Father   . Heart attack Father 71       precipitated by bleeding ulcer  . Diabetes Mother   . Hypertension Mother   . Coronary artery disease Mother   . Heart attack Paternal Grandfather        ? 64  . Heart attack Maternal Grandfather 68  . Diabetes Maternal Grandmother   . Stroke Maternal Grandmother 82  . Cancer Neg Hx   . Colon cancer Neg Hx   . Esophageal cancer Neg Hx   . Rectal cancer Neg Hx   . Stomach cancer Neg Hx     Current Outpatient Medications (Endocrine & Metabolic):  .  glipiZIDE (GLUCOTROL) 5 MG tablet, TAKE ONE TABLET BY MOUTH DAILY BEFORE DINNER .  metFORMIN (GLUCOPHAGE) 1000 MG tablet, TAKE ONE TABLET BY MOUTH TWICE A DAY .  TRULICITY 3 FO/2.7XA SOPN, INJECT 3 MG INTO THE SKIN ONCE A WEEK.  Current Outpatient Medications (Cardiovascular):  .  atorvastatin (LIPITOR) 40 MG tablet, TAKE ONE TABLET BY MOUTH DAILY AT 6PM .  benazepril (LOTENSIN) 40 MG tablet, TAKE ONE TABLET BY MOUTH DAILY .  ezetimibe (ZETIA) 10 MG tablet, TAKE 1 TABLET BY MOUTH DAILY .  hydrochlorothiazide (HYDRODIURIL) 25 MG tablet, TAKE 1/2 TABLET BY MOUTH DAILY .  metoprolol tartrate (LOPRESSOR) 50 MG tablet, TAKE 1/2 TABLET BY MOUTH TWICE A DAY  Current Outpatient Medications (Respiratory):  .  fluticasone (FLONASE) 50 MCG/ACT nasal spray, Place 2 sprays into both nostrils daily.  Current Outpatient Medications (Analgesics):  .  ibuprofen (ADVIL) 600 MG tablet, Take 600 mg by mouth 3 (three) times daily.   Current Outpatient Medications (Other):  .  glucose blood (ONETOUCH VERIO) test strip, Use to check blood sugar 1 time per day. Dx Code E11.9 .  traZODone (DESYREL) 50 MG tablet, Take 0.5-1 tablets (25-50 mg total) by mouth at bedtime as needed for sleep.   Reviewed prior external information including notes  and imaging from  primary care provider As well as notes that were available from care everywhere and other healthcare systems.  Past medical history, social, surgical and family history all reviewed in electronic medical record.  No pertanent information unless stated regarding to the chief complaint.   Review of Systems:  No headache, visual changes, nausea, vomiting, diarrhea, constipation, dizziness, abdominal pain, skin rash, fevers, chills, night sweats, weight loss, swollen lymph nodes, body aches, joint swelling, chest pain, shortness of breath, mood changes. POSITIVE muscle aches  Objective  Blood pressure 138/70, pulse (!) 103, height 5' 9"  (1.753  m), SpO2 97 %.   General: No apparent distress alert and oriented x3 mood and affect normal, dressed appropriately.  HEENT: Pupils equal, extraocular movements intact  Respiratory: Patient's speak in full sentences and does not appear short of breath  Cardiovascular: No lower extremity edema, non tender, no erythema  Gait relatively normal. MSK: Patient's left hip does have some mild decrease in internal range of motion of 10 degrees.  Patient is does not have any pain with internal range of motion.  More pain with FABER test.  Patient does have some tightness with straight leg test on the left greater than the right.  Patient is severely tender to palpation over L3-L4 and L4-L5 in the paraspinal musculature on the left side.  No spinous process tenderness.  Mild worsening pain with flexion and extension of the pain noted.  Mild weakness noted with plantarflexion and dorsiflexion of the left foot comparatively to the right foot.  Mild weakness with hip flexion on the left side than the right side as well.     Impression and Recommendations:    The above documentation has been reviewed and is accurate and complete Lyndal Pulley, DO

## 2020-12-03 ENCOUNTER — Encounter: Payer: Self-pay | Admitting: Family Medicine

## 2020-12-03 ENCOUNTER — Ambulatory Visit (INDEPENDENT_AMBULATORY_CARE_PROVIDER_SITE_OTHER): Payer: 59

## 2020-12-03 ENCOUNTER — Other Ambulatory Visit: Payer: Self-pay

## 2020-12-03 ENCOUNTER — Ambulatory Visit: Payer: 59 | Admitting: Family Medicine

## 2020-12-03 VITALS — BP 138/70 | HR 103 | Ht 69.0 in

## 2020-12-03 DIAGNOSIS — M545 Low back pain, unspecified: Secondary | ICD-10-CM

## 2020-12-03 DIAGNOSIS — M25552 Pain in left hip: Secondary | ICD-10-CM

## 2020-12-03 DIAGNOSIS — M5416 Radiculopathy, lumbar region: Secondary | ICD-10-CM | POA: Diagnosis not present

## 2020-12-03 NOTE — Patient Instructions (Signed)
MRI lumbar [phone] Would likely possible nerve root injection Will write you in MyChart

## 2020-12-03 NOTE — Assessment & Plan Note (Signed)
Patient is having what appears to be more of a exacerbation of a chronic condition.  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  Patient does have a mild arthritic changes of the left hip with a bone spur that does decrease some internal range of motion but patient is having very minimal groin pain with it.  Good strength actually of the hip joint but does have some maybe mild weakness of extension of the knee compared to the contralateral side.  At this point we will have patient get MRI of the back secondary to the weakness, positive straight leg and patient did respond extremely well to nerve root injections of previously.  Depending on findings we will discuss further management.

## 2020-12-10 ENCOUNTER — Telehealth: Payer: Self-pay

## 2020-12-10 NOTE — Telephone Encounter (Signed)
Yes we can do peer to peer  She has weakness and noted.  Can do today if possible or maybe on Tuesday at end of the day

## 2020-12-10 NOTE — Telephone Encounter (Signed)
MRI Lumbar spine denied for reasons stated below. Do you want to try a peer to peer?  Your doctor must have noted that you had muscle weakness graded three out of five or less on an exam done by your doctor. This scale goes from grade five being no weakness, to grade zero beingsevere weakness.   Imaging requires six weeks of provider directed treatment to be completed. Supported treatments include (but are not limited to) drugs for swelling or pain, an in office workout (physical therapy), and/or oral or injected steroids. This must have been completed in the past three months without improved symptoms. Contact (via office visit, phone, email, or messaging) must occur after the treatment is completed. This has not been met because:  . You have not completed six weeks of provider directed treatment. . The provider directed treatment did not occur within the last three months. . Symptoms must be the same or worse after treatment to support imaging. . There was no contact with your provider after completing treatment.

## 2020-12-14 ENCOUNTER — Other Ambulatory Visit: Payer: Self-pay | Admitting: Internal Medicine

## 2020-12-14 NOTE — Telephone Encounter (Signed)
Peer to peer scheduled for today at 5:15 Will be calling Tamala Julian on personal phone

## 2020-12-15 ENCOUNTER — Other Ambulatory Visit: Payer: Self-pay

## 2020-12-15 ENCOUNTER — Ambulatory Visit
Admission: RE | Admit: 2020-12-15 | Discharge: 2020-12-15 | Disposition: A | Discharge status: Home / Self Care | Payer: 59 | Source: Ambulatory Visit | Attending: Family Medicine | Admitting: Family Medicine

## 2020-12-15 ENCOUNTER — Other Ambulatory Visit: Payer: 59

## 2020-12-15 DIAGNOSIS — M545 Low back pain, unspecified: Secondary | ICD-10-CM

## 2020-12-16 ENCOUNTER — Other Ambulatory Visit: Payer: Self-pay

## 2020-12-16 ENCOUNTER — Encounter: Payer: Self-pay | Admitting: Family Medicine

## 2020-12-16 DIAGNOSIS — M5416 Radiculopathy, lumbar region: Secondary | ICD-10-CM

## 2020-12-19 ENCOUNTER — Encounter: Payer: Self-pay | Admitting: Family Medicine

## 2020-12-22 ENCOUNTER — Other Ambulatory Visit: Payer: 59

## 2020-12-23 ENCOUNTER — Other Ambulatory Visit: Payer: Self-pay

## 2020-12-23 ENCOUNTER — Ambulatory Visit
Admission: RE | Admit: 2020-12-23 | Discharge: 2020-12-23 | Disposition: A | Discharge status: Home / Self Care | Payer: 59 | Source: Ambulatory Visit | Attending: Family Medicine | Admitting: Family Medicine

## 2020-12-23 DIAGNOSIS — M5416 Radiculopathy, lumbar region: Secondary | ICD-10-CM

## 2020-12-23 MED ORDER — IOPAMIDOL (ISOVUE-M 200) INJECTION 41%
1.0000 mL | Freq: Once | INTRAMUSCULAR | Status: AC
  Administered 2020-12-23: 1 mL via EPIDURAL

## 2020-12-23 MED ORDER — METHYLPREDNISOLONE ACETATE 40 MG/ML INJ SUSP (RADIOLOG
80.0000 mg | Freq: Once | INTRAMUSCULAR | Status: AC
  Administered 2020-12-23: 80 mg via EPIDURAL

## 2020-12-23 NOTE — Discharge Instructions (Signed)
Post Procedure Spinal Discharge Instruction Sheet  You may resume a regular diet and any medications that you routinely take (including pain medications).  No driving day of procedure.  Light activity throughout the rest of the day.  Do not do any strenuous work, exercise, bending or lifting.  The day following the procedure, you can resume normal physical activity but you should refrain from exercising or physical therapy for at least three days thereafter.   Common Side Effects:  Headaches- take your usual medications as directed by your physician.  Increase your fluid intake.  Caffeinated beverages may be helpful.  Lie flat in bed until your headache resolves.  Restlessness or inability to sleep- you may have trouble sleeping for the next few days.  Ask your referring physician if you need any medication for sleep.  Facial flushing or redness- should subside within a few days.  Increased pain- a temporary increase in pain a day or two following your procedure is not unusual.  Take your pain medication as prescribed by your referring physician.  Leg cramps  Please contact our office at [phone] for the following symptoms: Fever greater than 100 degrees. Headaches unresolved with medication after 2-3 days. Increased swelling, pain, or redness at injection site.   Thank you for visiting [facility] today.

## 2020-12-26 ENCOUNTER — Encounter: Payer: Self-pay | Admitting: Internal Medicine

## 2020-12-28 MED ORDER — ROSUVASTATIN CALCIUM 40 MG PO TABS: 40.0000 mg | ORAL_TABLET | Freq: Every day | ORAL | 3 refills | Status: DC

## 2020-12-30 ENCOUNTER — Ambulatory Visit: Payer: 59 | Admitting: Family Medicine

## 2021-01-12 ENCOUNTER — Other Ambulatory Visit: Payer: Self-pay | Admitting: Internal Medicine

## 2021-01-13 ENCOUNTER — Other Ambulatory Visit: Payer: Self-pay

## 2021-01-13 ENCOUNTER — Encounter: Payer: Self-pay | Admitting: Internal Medicine

## 2021-01-13 ENCOUNTER — Ambulatory Visit: Payer: 59 | Admitting: Internal Medicine

## 2021-01-13 VITALS — BP 140/72 | HR 105 | Ht 69.0 in | Wt 201.0 lb

## 2021-01-13 DIAGNOSIS — E1151 Type 2 diabetes mellitus with diabetic peripheral angiopathy without gangrene: Secondary | ICD-10-CM | POA: Diagnosis not present

## 2021-01-13 DIAGNOSIS — E669 Obesity, unspecified: Secondary | ICD-10-CM

## 2021-01-13 DIAGNOSIS — E7849 Other hyperlipidemia: Secondary | ICD-10-CM | POA: Diagnosis not present

## 2021-01-13 MED ORDER — TRULICITY 3 MG/0.5ML ~~LOC~~ SOAJ: 3.0000 mg | SUBCUTANEOUS | 3 refills | Status: DC

## 2021-01-13 MED ORDER — METFORMIN HCL 1000 MG PO TABS: 1000.0000 mg | ORAL_TABLET | Freq: Two times a day (BID) | ORAL | 3 refills | Status: DC

## 2021-01-13 MED ORDER — GLIPIZIDE 5 MG PO TABS: ORAL_TABLET | ORAL | 5 refills | Status: DC

## 2021-01-13 NOTE — Progress Notes (Signed)
Patient ID: [name], female   DOB: Aug 16, 1955, 65 y.o.   MRN: [id number]  This visit occurred during the SARS-CoV-2 public health emergency.  Safety protocols were in place, including screening questions prior to the visit, additional usage of staff PPE, and extensive cleaning of exam room while observing appropriate contact time as indicated for disinfecting solutions.   HPI: [name] is a pleasant 65 y.o.-year-old female, presenting for follow-up for DM2, dx in 2000, non-insulin-dependent, uncontrolled, with complications (PVD, CKD, DR).  Last visit 8 months ago.  Interim history: No increased urination, blurry vision, nausea, chest pain.  Reviewed HbA1c levels: Lab Results  Component Value Date   HGBA1C 6.3 (A) 05/20/2020   HGBA1C 6.1 (A) 11/13/2019   HGBA1C 7.3 (A) 06/10/2019   Pt is on a regimen of: - Metformin 2000 mg with dinner - Glipizide 5 mg before dinner >> 2.5 mg before a larger dinner >> 5 mg before dinner - Trulicity 1.5 >> 3 mg weekly She was on Farxiga 10 mg daily in am >> stopped 08/2017 after mixup at the pharmacy. She was on [employer] stopped covering it. She was on Kombiglyze = Metformin + Onglyza (Saxagliptin) - 1000-2.5 mg daily >> rash.  Pt checks her sugars 0-1x a day: - am:  92, 108-161 >> 65, 81-111, 127 >> 81-100 >> 74-121, 132 - 2h after b'fast: 126 >> n/c >> 130, 218 >> n/c >> 130 >> n/c - before lunch: n/c >> 138 >> n/c - 2h after lunch:  160-177 >> 170-175 >> n/c >> 180 >> 150 - before dinner: 208 >> n/c >> 120-128 >> n/c >> 118 - 2h after dinner: n/c >> 214 - bedtime: n/c - nighttime: n/c Lowest sugar was: 92 >> 65 x2 >> 80 >> 74; she has hypoglycemia awareness in the 80. Highest sugar was: 218 >> 127 >> 180 >> 214.  Glucometer: One Touch Ultra  -+ Mild CKD, last BUN/creatinine:  Lab Results  Component Value Date   BUN 33 (H) 08/26/2020   CREATININE 1.28 (H) 08/26/2020   Lab Results  Component Value Date   GFRAA 85  04/22/2008   GFRAA 85 11/22/2006   Lab Results  Component Value Date   MICRALBCREAT 0.8 08/26/2020   MICRALBCREAT 0.7 06/07/2015   MICRALBCREAT 0.8 02/03/2015   MICRALBCREAT 0.5 06/05/2014   MICRALBCREAT 0.4 04/05/2012   MICRALBCREAT 0.6 11/01/2010   MICRALBCREAT 0.4 02/14/2010   MICRALBCREAT 5.1 04/22/2008   MICRALBCREAT 3.0 11/22/2006  On benazepril.  -+ HL. Last set of lipids: Lab Results  Component Value Date   CHOL 148 08/26/2020   HDL 48.40 08/26/2020   LDLCALC 82 08/26/2020   LDLDIRECT 149.9 02/14/2010   TRIG 88.0 08/26/2020   CHOLHDL 3 08/26/2020  On Lipitor 40 and Zetia 10.  - last eye exam was in 07/2020: No DR reportedly; she had hemorrhage in the left eye in the past.  -+ Numbness and tingling in her feet developed after spine surgery  She also has HTN, Had back surgery 2007. Back pain increased 02/2015 >> saw Dr Tamala Julian >> started Gabapentin >> disequilibrium. She was on steroids then >> sugars higher. In 03/2015: spine steroid inj.  For Thanksgiving 2020, she cooked dinner for 30 families in need.  ROS: Constitutional: no weight gain/no weight loss, no fatigue, no subjective hyperthermia, no subjective hypothermia Eyes: no blurry vision, no xerophthalmia ENT: no sore throat, no nodules palpated in neck, no dysphagia, no odynophagia, no hoarseness Cardiovascular: no  CP/no SOB/no palpitations/no leg swelling Respiratory: no cough/no SOB/no wheezing Gastrointestinal: no N/no V/no D/no C/no acid reflux Musculoskeletal: no muscle aches/no joint aches Skin: no rashes, no hair loss Neurological: no tremors/+ numbness/+ tingling/no dizziness  I reviewed pt's medications, allergies, PMH, social hx, family hx, and changes were documented in the history of present illness. Otherwise, unchanged from my initial visit note.  Past Medical History:  Diagnosis Date   Allergy    Anxiety    Arthritis    Colitis    in her 54s; quiescent since   DM (diabetes mellitus)  ([place])    HTN (hypertension)    Hyperlipidemia    Past Surgical History:  Procedure Laterality Date   CERVIX SURGERY  2001   Cryosurgery, Dr Connye Burkitt   COLONOSCOPY  2009   Negative, Dr. Delfin Edis   FOOT SURGERY     bone spurs resected, Dr Aida Puffer- 2019   KNEE ARTHROSCOPY  1980   Right   LAMINECTOMY  2007   X 2, Dr Gladstone Lighter   Social History   Social History   Marital Status: Divorced    Spouse Name: N/A   Number of Children: 1   Occupational History   [employer]   Social History Main Topics   Smoking status: Former Smoker    Quit date: 1996   Alcohol Use: Yes     Comment:  rarely   Drug Use: No   Current Outpatient Medications on File Prior to Visit  Medication Sig Dispense Refill   benazepril (LOTENSIN) 40 MG tablet TAKE ONE TABLET BY MOUTH DAILY 90 tablet 3   ezetimibe (ZETIA) 10 MG tablet TAKE 1 TABLET BY MOUTH DAILY 30 tablet 4   fluticasone (FLONASE) 50 MCG/ACT nasal spray Place 2 sprays into both nostrils daily. 16 g 6   glipiZIDE (GLUCOTROL) 5 MG tablet TAKE ONE TABLET BY MOUTH DAILY BEFORE DINNER 30 tablet 5   glucose blood (ONETOUCH VERIO) test strip Use to check blood sugar 1 time per day. Dx Code E11.9 100 each 12   hydrochlorothiazide (HYDRODIURIL) 25 MG tablet TAKE 1/2 TABLET BY MOUTH DAILY 15 tablet 4   ibuprofen (ADVIL) 600 MG tablet Take 600 mg by mouth 3 (three) times daily.     metFORMIN (GLUCOPHAGE) 1000 MG tablet TAKE ONE TABLET BY MOUTH TWICE A DAY 120 tablet 0   metoprolol tartrate (LOPRESSOR) 50 MG tablet TAKE 1/2 TABLET BY MOUTH TWICE A DAY 30 tablet 4   rosuvastatin (CRESTOR) 40 MG tablet Take 1 tablet (40 mg total) by mouth daily. 90 tablet 3   traZODone (DESYREL) 50 MG tablet Take 0.5-1 tablets (25-50 mg total) by mouth at bedtime as needed for sleep. 30 tablet 3   TRULICITY 3 GE/3.6OQ SOPN INJECT 3 MG INTO THE SKIN ONCE A WEEK. 6 mL 3   No current facility-administered medications on file prior to visit.   Allergies   Allergen Reactions   Atorvastatin     REACTION: ELEVATES LFT'S. Pt states she is currently taking Atorvastatin 40 mg (1/2 tablet daily) and does not recall hab=ving any issues with it. (07/14/14)   Tramadol Nausea And Vomiting   Codeine Nausea Only        Neomycin-Bacitracin Zn-Polymyx Rash   Onglyza [Saxagliptin Hydrochloride] Rash    This occurred after starting Kombiglyze . This was present as a rash over the hands and feet dorsally. She has been able to take metformin in the past as well as Janumet without problems.  Sulfamethoxazole-Trimethoprim Nausea And Vomiting     Nausea was extreme.     Family History  Problem Relation Age of Onset   Diabetes Father    Hypertension Father    Heart attack Father 8       precipitated by bleeding ulcer   Diabetes Mother    Hypertension Mother    Coronary artery disease Mother    Heart attack Paternal Grandfather        ? 49   Heart attack Maternal Grandfather 68   Diabetes Maternal Grandmother    Stroke Maternal Grandmother 82   Cancer Neg Hx    Colon cancer Neg Hx    Esophageal cancer Neg Hx    Rectal cancer Neg Hx    Stomach cancer Neg Hx    PE: BP 140/72   Pulse (!) 105   Ht 5' 9"  (1.753 m)   Wt 201 lb (91.2 kg)   SpO2 98%   BMI 29.68 kg/m  Body mass index is 29.68 kg/m.  Wt Readings from Last 3 Encounters:  01/13/21 201 lb (91.2 kg)  08/26/20 200 lb (90.7 kg)  05/20/20 206 lb 6.4 oz (93.6 kg)   Constitutional: overweight, in NAD Eyes: PERRLA, EOMI, no exophthalmos ENT: moist mucous membranes, no thyromegaly, no cervical lymphadenopathy Cardiovascular: tachycardia, RR, No MRG Respiratory: CTA B Gastrointestinal: abdomen soft, NT, ND, BS+ Musculoskeletal: no deformities, strength intact in all 4 Skin: moist, warm, no rashes Neurological: no tremor with outstretched hands, DTR normal in all 4  ASSESSMENT: 1. DM2, non-insulin-dependent, uncontrolled, with complications - PVD - CKD - DR  2. HL  3. Obesity  class 1  PLAN:  1. Patient with longstanding, previously uncontrolled, type 2 diabetes, with significant improvement in control after she started to improve her diet and be compliant with a diabetic medications.  At last visit, sugars remained well controlled in the morning, but she was not checking consistently later in the day and I advised her to rotate the checks throughout the day.  Before last visit, she increased the dose of glipizide back to 5 mg before dinner and as sugars were higher when she tried to back off.  We continued the same regimen at last visit.  HbA1c at last visit was excellent, 6.3%, but slightly higher than before. -She tolerates Trulicity well, without GI side effects. -At today's visit, sugars are better, the vast majority at goal, with only 1 high blood sugar at 214 after lunch and dinner.  She usually takes 5 mg of glipizide before meals.  She does not have any blood sugar checks after dinner and we discussed about checking some more values around this time.  For now, I did advise her to try to use a lower dose of glipizide before dinner if possible, since she has some sugars in the 70s in the morning.  Otherwise, we can continue the same regimen.  Since last visit, she switched from 2000 mg of metformin with dinner to 1000 mg with lunch and dinner, which we will continue. - I suggested to:  Patient Instructions  Please continue: - Metformin 1000 mg with lunch and dinner - Trulicity 3 mg weekly  Please use: - Glipizide 2.5-5 mg before dinner  Please return in 4-6 months with your sugar log.  - we checked her HbA1c: 6.1% (better) - advised to check sugars at different times of the day - 1x a day, rotating check times - advised for yearly eye exams >> she is UTD - return  to clinic in 4-6 months  2. HL -Reviewed latest lipid panel from 08/2020: LDL higher than 70, which is our goal for her, the rest of the fractions at goal Lab Results  Component Value Date   CHOL  148 08/26/2020   HDL 48.40 08/26/2020   LDLCALC 82 08/26/2020   LDLDIRECT 149.9 02/14/2010   TRIG 88.0 08/26/2020   CHOLHDL 3 08/26/2020  -On Lipitor 40 mg daily and Zetia 10.  Lipitor was increased at last visit.  3. Obesity class 1 -We will continue Trulicity which should also help with weight loss -Weight was stable at last visit, but previously lost 10 pounds -At today's visit, weight is only up by 1 pound.  Philemon Kingdom, MD PhD [facility] Endocrinology

## 2021-01-13 NOTE — Patient Instructions (Addendum)
Please continue: - Metformin 1000 mg with lunch and dinner - Trulicity 3 mg weekly  Please use: - Glipizide 2.5-5 mg before dinner  Please return in 4-6 months with your sugar log.

## 2021-01-18 ENCOUNTER — Other Ambulatory Visit: Payer: Self-pay | Admitting: Internal Medicine

## 2021-01-27 ENCOUNTER — Ambulatory Visit: Payer: 59 | Admitting: *Deleted

## 2021-01-27 ENCOUNTER — Other Ambulatory Visit: Payer: Self-pay

## 2021-01-27 VITALS — Ht 69.0 in | Wt 201.0 lb

## 2021-01-27 DIAGNOSIS — Z86010 Personal history of colonic polyps: Secondary | ICD-10-CM

## 2021-01-27 MED ORDER — ONDANSETRON HCL 4 MG PO TABS: ORAL_TABLET | ORAL | 0 refills | Status: DC

## 2021-01-27 MED ORDER — PLENVU 140 G PO SOLR: 1.0000 | Freq: Once | ORAL | 0 refills | Status: AC

## 2021-01-27 NOTE — Progress Notes (Signed)
  No trouble with anesthesia, denies being told they were difficult to intubate, or hx/fam hx of malignant hyperthermia per pt   No egg or soy allergy  No home oxygen use   No medications for weight loss taken  Pt informed that we do not do prior authorizations for prep  2 day Plenvu/Miralax prep given, Zofran RX also sent to pharmacy  Plenvu coupon given and code put into RX

## 2021-02-10 ENCOUNTER — Ambulatory Visit (AMBULATORY_SURGERY_CENTER): Payer: 59 | Admitting: Gastroenterology

## 2021-02-10 ENCOUNTER — Encounter: Payer: Self-pay | Admitting: Gastroenterology

## 2021-02-10 ENCOUNTER — Other Ambulatory Visit: Payer: Self-pay

## 2021-02-10 VITALS — BP 99/60 | HR 105 | Temp 98.9°F | Resp 17 | Ht 69.0 in | Wt 201.0 lb

## 2021-02-10 DIAGNOSIS — D128 Benign neoplasm of rectum: Secondary | ICD-10-CM

## 2021-02-10 DIAGNOSIS — Z86010 Personal history of colonic polyps: Secondary | ICD-10-CM

## 2021-02-10 DIAGNOSIS — K621 Rectal polyp: Secondary | ICD-10-CM

## 2021-02-10 MED ORDER — SODIUM CHLORIDE 0.9 % IV SOLN: 500.0000 mL | Freq: Once | INTRAVENOUS | Status: DC

## 2021-02-10 NOTE — Patient Instructions (Signed)
YOU HAD AN ENDOSCOPIC PROCEDURE TODAY AT THE [facility]:   Refer to the procedure report that was given to you for any specific questions about what was found during the examination.  If the procedure report does not answer your questions, please call your gastroenterologist to clarify.  If you requested that your care partner not be given the details of your procedure findings, then the procedure report has been included in a sealed envelope for you to review at your convenience later.  YOU SHOULD EXPECT: Some feelings of bloating in the abdomen. Passage of more gas than usual.  Walking can help get rid of the air that was put into your GI tract during the procedure and reduce the bloating. If you had a lower endoscopy (such as a colonoscopy or flexible sigmoidoscopy) you may notice spotting of blood in your stool or on the toilet paper. If you underwent a bowel prep for your procedure, you may not have a normal bowel movement for a few days.  Please Note:  You might notice some irritation and congestion in your nose or some drainage.  This is from the oxygen used during your procedure.  There is no need for concern and it should clear up in a day or so.  SYMPTOMS TO REPORT IMMEDIATELY:   Following lower endoscopy (colonoscopy or flexible sigmoidoscopy):  Excessive amounts of blood in the stool  Significant tenderness or worsening of abdominal pains  Swelling of the abdomen that is new, acute  Fever of 100F or higher  For urgent or emergent issues, a gastroenterologist can be reached at any hour by calling [phone]. Do not use MyChart messaging for urgent concerns.    DIET:  We do recommend a small meal at first, but then you may proceed to your regular diet.  Drink plenty of fluids but you should avoid alcoholic beverages for 24 hours.  ACTIVITY:  You should plan to take it easy for the rest of today and you should NOT DRIVE or use heavy machinery until tomorrow (because  of the sedation medicines used during the test).    FOLLOW UP: Our staff will call the number listed on your records 48-72 hours following your procedure to check on you and address any questions or concerns that you may have regarding the information given to you following your procedure. If we do not reach you, we will leave a message.  We will attempt to reach you two times.  During this call, we will ask if you have developed any symptoms of COVID 19. If you develop any symptoms (ie: fever, flu-like symptoms, shortness of breath, cough etc.) before then, please call [phone].  If you test positive for Covid 19 in the 2 weeks post procedure, please call and report this information to us.    If any biopsies were taken you will be contacted by phone or by letter within the next 1-3 weeks.  Please call us at [phone] if you have not heard about the biopsies in 3 weeks.    SIGNATURES/CONFIDENTIALITY: You and/or your care partner have signed paperwork which will be entered into your electronic medical record.  These signatures attest to the fact that that the information above on your After Visit Summary has been reviewed and is understood.  Full responsibility of the confidentiality of this discharge information lies with you and/or your care-partner. 

## 2021-02-10 NOTE — Op Note (Signed)
[place] Patient Name: [name] Procedure Date: 02/10/2021 11:02 AM MRN: [id number] Endoscopist: Mauri Pole , MD Age: 65 Referring MD:  Date of Birth: Aug 04, 1955 Gender: Female Account #: [id number] Procedure:                Colonoscopy Indications:              High risk colon cancer surveillance: Personal                            history of colonic polyps, High risk colon cancer                            surveillance: Personal history of multiple (3 or                            more) adenomas Medicines:                Monitored Anesthesia Care Procedure:                Pre-Anesthesia Assessment:                           - Prior to the procedure, a History and Physical                            was performed, and patient medications and                            allergies were reviewed. The patient's tolerance of                            previous anesthesia was also reviewed. The risks                            and benefits of the procedure and the sedation                            options and risks were discussed with the patient.                            All questions were answered, and informed consent                            was obtained. Prior Anticoagulants: The patient has                            taken no previous anticoagulant or antiplatelet                            agents. ASA Grade Assessment: II - A patient with                            mild systemic disease. After reviewing the risks  and benefits, the patient was deemed in                            satisfactory condition to undergo the procedure.                           After obtaining informed consent, the colonoscope                            was passed under direct vision. Throughout the                            procedure, the patient's blood pressure, pulse, and                            oxygen saturations were monitored continuously.  The                            [id number] PCF-H190TL Slim SB Colonoscope was introduced                            through the anus and advanced to the the cecum,                            identified by appendiceal orifice and ileocecal                            valve. The colonoscopy was performed without                            difficulty. The patient tolerated the procedure                            well. The quality of the bowel preparation was                            excellent. The ileocecal valve, appendiceal                            orifice, and rectum were photographed. Scope In: 11:18:50 AM Scope Out: 11:36:20 AM Scope Withdrawal Time: 0 hours 9 minutes 32 seconds  Total Procedure Duration: 0 hours 17 minutes 30 seconds  Findings:                 The perianal and digital rectal examinations were                            normal.                           A 4 mm polyp was found in the rectum. The polyp was                            sessile. The polyp was removed with a cold snare.  Resection and retrieval were complete.                           Non-bleeding external and internal hemorrhoids were                            found during retroflexion. The hemorrhoids were                            medium-sized.                           The exam was otherwise without abnormality. Complications:            No immediate complications. Estimated Blood Loss:     Estimated blood loss was minimal. Impression:               - One 4 mm polyp in the rectum, removed with a cold                            snare. Resected and retrieved.                           - Non-bleeding external and internal hemorrhoids.                           - The examination was otherwise normal. Recommendation:           - Patient has a contact number available for                            emergencies. The signs and symptoms of potential                            delayed  complications were discussed with the                            patient. Return to normal activities tomorrow.                            Written discharge instructions were provided to the                            patient.                           - Resume previous diet.                           - Continue present medications.                           - Await pathology results.                           - Repeat colonoscopy in 5 years for surveillance  based on pathology results. Mauri Pole, MD 02/10/2021 11:41:36 AM This report has been signed electronically.

## 2021-02-10 NOTE — Progress Notes (Signed)
1130 BP lower than I would like with L carotid stemosis.  HR is ST  we only have ephedrine.  Therefore sedation halted and HOB dropped to [place].  Pt awake and alert x4 shortly after

## 2021-02-10 NOTE — Progress Notes (Signed)
Called to room to assist during endoscopic procedure.  Patient ID and intended procedure confirmed with present staff. Received instructions for my participation in the procedure from the performing physician.  

## 2021-02-10 NOTE — Progress Notes (Signed)
Medical history reviewed with no changes noted. VS assessed by C.W 

## 2021-02-10 NOTE — Progress Notes (Signed)
Report to PACU, RN, vss, BBS= Clear.  

## 2021-02-14 ENCOUNTER — Telehealth: Payer: Self-pay | Admitting: *Deleted

## 2021-02-14 NOTE — Telephone Encounter (Signed)
  Follow up Call-  Call back number 02/10/2021 11/11/2019  Post procedure Call Back phone  # [phone]  Permission to leave phone message Yes Yes  Some recent data might be hidden   [facility]

## 2021-02-14 NOTE — Telephone Encounter (Signed)
  Follow up Call-  Call back number 02/10/2021 11/11/2019  Post procedure Call Back phone  # [phone]  Permission to leave phone message Yes Yes  Some recent data might be hidden    No answer at 2nd attempt follow up phone call.  Left message on voicemail.

## 2021-02-17 ENCOUNTER — Encounter: Payer: Self-pay | Admitting: Internal Medicine

## 2021-02-22 ENCOUNTER — Other Ambulatory Visit: Payer: Self-pay | Admitting: Internal Medicine

## 2021-02-23 ENCOUNTER — Ambulatory Visit: Payer: 59 | Admitting: Internal Medicine

## 2021-03-03 ENCOUNTER — Encounter: Payer: Self-pay | Admitting: Gastroenterology

## 2021-03-11 ENCOUNTER — Encounter: Payer: Self-pay | Admitting: Internal Medicine

## 2021-04-07 ENCOUNTER — Encounter: Payer: Self-pay | Admitting: Internal Medicine

## 2021-04-12 NOTE — Telephone Encounter (Signed)
Pt would like another coupon for Trulicity. Out of her medication for 6weeks. [facility] [address] Please call pt [phone]

## 2021-04-19 ENCOUNTER — Encounter: Payer: Self-pay | Admitting: Internal Medicine

## 2021-04-24 ENCOUNTER — Encounter: Payer: Self-pay | Admitting: Internal Medicine

## 2021-04-24 NOTE — Patient Instructions (Addendum)
  Blood work was ordered.     Medications changes include :   benazpril 20 mg and metoprolol 50 mg twice daily  Your prescription(s) have been submitted to your pharmacy. Please take as directed and contact our office if you believe you are having problem(s) with the medication(s).    Please followup in 6 months

## 2021-04-24 NOTE — Progress Notes (Signed)
Subjective:    Patient ID: [name], female    DOB: 1956/04/04, 65 y.o.   MRN: [id number]  This visit occurred during the SARS-CoV-2 public health emergency.  Safety protocols were in place, including screening questions prior to the visit, additional usage of staff PPE, and extensive cleaning of exam room while observing appropriate contact time as indicated for disinfecting solutions.     HPI The patient is here for follow up of their chronic medical problems, including htn, hld, DM, sleep difficulties  She is taking her medication as prescribed.  She is walking a little bit, but her lower back pain inhibits her from walking too much.  Medications and allergies reviewed with patient and updated if appropriate.  Patient Active Problem List   Diagnosis Date Noted   Sleeping difficulty 08/26/2020   Alopecia areata 08/26/2020   Esophageal spasm 08/26/2020   Anxiety and depression 08/26/2020   Thrombocytopenia ([place]) 07/13/2019   Fatty liver 07/16/2016   Lumbar back pain with radiculopathy affecting left lower extremity 02/22/2015   PVC's (premature ventricular contractions) 06/09/2014   Essential hypertension 02/14/2010   Type 2 diabetes mellitus with peripheral vascular disease ([place]) 11/21/2006   Other hyperlipidemia 11/21/2006    Current Outpatient Medications on File Prior to Visit  Medication Sig Dispense Refill   benazepril (LOTENSIN) 40 MG tablet TAKE ONE TABLET BY MOUTH DAILY 90 tablet 3   ezetimibe (ZETIA) 10 MG tablet TAKE ONE TABLET BY MOUTH DAILY 30 tablet 4   glipiZIDE (GLUCOTROL) 5 MG tablet Take by mouth 5 mg before dinner (Patient taking differently: Take by mouth 5 mg before dinner- only with big meals, takes PRN) 90 tablet 5   glucose blood (ONETOUCH VERIO) test strip Use to check blood sugar 1 time per day. Dx Code E11.9 100 each 12   hydrochlorothiazide (HYDRODIURIL) 25 MG tablet TAKE 1/2 TABLET BY MOUTH DAILY 15 tablet 4   metFORMIN (GLUCOPHAGE) 1000  MG tablet Take 1 tablet (1,000 mg total) by mouth 2 (two) times daily. 180 tablet 3   metoprolol tartrate (LOPRESSOR) 50 MG tablet TAKE 1/2 TABLET BY MOUTH TWICE A DAY 30 tablet 4   rosuvastatin (CRESTOR) 40 MG tablet Take 1 tablet (40 mg total) by mouth daily. 90 tablet 3   TRULICITY 3 PP/2.9JJ SOPN INJECT 3 MG INTO THE SKIN ONCE A WEEK. 6 mL 3   No current facility-administered medications on file prior to visit.    Past Medical History:  Diagnosis Date   Allergy    Anxiety    Arthritis    Colitis    in her 66s; quiescent since   DM (diabetes mellitus) ([place])    HTN (hypertension)    Hyperlipidemia     Past Surgical History:  Procedure Laterality Date   CERVIX SURGERY  2001   Cryosurgery, Dr Connye Burkitt   COLONOSCOPY  2009   Negative, Dr. Delfin Edis   FOOT SURGERY     bone spurs resected, Dr Aida Puffer- 2019   [place]   Right   LAMINECTOMY  2007   X 2, Dr Gladstone Lighter    Social History   Socioeconomic History   Marital status: Divorced    Spouse name: Not on file   Number of children: Not on file   Years of education: Not on file   Highest education level: Not on file  Occupational History   Not on file  Tobacco Use   Smoking status: Former  Types: Cigarettes    Quit date: 07/17/1977    Years since quitting: 43.8   Smokeless tobacco: Never   Tobacco comments:    Smoked in [place] ; [phone], up to 1/2 ppd  Vaping Use   Vaping Use: Never used  Substance and Sexual Activity   Alcohol use: Yes    Comment:  rarely   Drug use: No   Sexual activity: Not on file  Other Topics Concern   Not on file  Social History Narrative   Not on file   Social Determinants of Health   Financial Resource Strain: Not on file  Food Insecurity: Not on file  Transportation Needs: Not on file  Physical Activity: Not on file  Stress: Not on file  Social Connections: Not on file    Family History  Problem Relation Age of Onset   Diabetes Father    Hypertension  Father    Heart attack Father 87       precipitated by bleeding ulcer   Diabetes Mother    Hypertension Mother    Coronary artery disease Mother    Heart attack Paternal Grandfather        ? 27   Heart attack Maternal Grandfather 68   Diabetes Maternal Grandmother    Stroke Maternal Grandmother 82   Cancer Neg Hx    Colon cancer Neg Hx    Esophageal cancer Neg Hx    Rectal cancer Neg Hx    Stomach cancer Neg Hx     Review of Systems  Constitutional:  Negative for chills and fever.  Respiratory:  Negative for cough, shortness of breath and wheezing.   Cardiovascular:  Positive for palpitations (PVCs). Negative for chest pain and leg swelling.  Musculoskeletal:  Positive for back pain.  Neurological:  Negative for light-headedness and headaches.      Objective:   Vitals:   04/25/21 0935  BP: 138/70  Pulse: 95  Temp: 98.2 F (36.8 C)  SpO2: 97%   BP Readings from Last 3 Encounters:  04/25/21 138/70  02/10/21 99/60  01/13/21 140/72   Wt Readings from Last 3 Encounters:  04/25/21 201 lb (91.2 kg)  02/10/21 201 lb (91.2 kg)  01/27/21 201 lb (91.2 kg)   Body mass index is 29.68 kg/m.   Physical Exam    Constitutional: Appears well-developed and well-nourished. No distress.  HENT:  Head: Normocephalic and atraumatic.  Neck: Neck supple. No tracheal deviation present. No thyromegaly present.  No cervical lymphadenopathy Cardiovascular: Normal rate, regular rhythm and normal heart sounds.   No murmur heard. No carotid bruit .  No edema Pulmonary/Chest: Effort normal and breath sounds normal. No respiratory distress. No has no wheezes. No rales.  Skin: Skin is warm and dry. Not diaphoretic.  Psychiatric: Normal mood and affect. Behavior is normal.      Assessment & Plan:    See Problem List for Assessment and Plan of chronic medical problems.

## 2021-04-25 ENCOUNTER — Ambulatory Visit: Payer: 59 | Admitting: Internal Medicine

## 2021-04-25 ENCOUNTER — Encounter: Payer: Self-pay | Admitting: Internal Medicine

## 2021-04-25 ENCOUNTER — Other Ambulatory Visit: Payer: Self-pay

## 2021-04-25 VITALS — BP 138/70 | HR 95 | Temp 98.2°F | Ht 69.0 in | Wt 201.0 lb

## 2021-04-25 DIAGNOSIS — E1151 Type 2 diabetes mellitus with diabetic peripheral angiopathy without gangrene: Secondary | ICD-10-CM

## 2021-04-25 DIAGNOSIS — G479 Sleep disorder, unspecified: Secondary | ICD-10-CM | POA: Diagnosis not present

## 2021-04-25 DIAGNOSIS — I493 Ventricular premature depolarization: Secondary | ICD-10-CM | POA: Diagnosis not present

## 2021-04-25 DIAGNOSIS — I10 Essential (primary) hypertension: Secondary | ICD-10-CM

## 2021-04-25 DIAGNOSIS — D696 Thrombocytopenia, unspecified: Secondary | ICD-10-CM

## 2021-04-25 DIAGNOSIS — E7849 Other hyperlipidemia: Secondary | ICD-10-CM

## 2021-04-25 DIAGNOSIS — F32A Depression, unspecified: Secondary | ICD-10-CM

## 2021-04-25 LAB — COMPREHENSIVE METABOLIC PANEL
ALT: 37 U/L — ABNORMAL HIGH (ref 0–35)
AST: 55 U/L — ABNORMAL HIGH (ref 0–37)
Albumin: 4.1 g/dL (ref 3.5–5.2)
Alkaline Phosphatase: 86 U/L (ref 39–117)
BUN: 25 mg/dL — ABNORMAL HIGH (ref 6–23)
CO2: 26 mEq/L (ref 19–32)
Calcium: 9.9 mg/dL (ref 8.4–10.5)
Chloride: 105 mEq/L (ref 96–112)
Creatinine, Ser: 1.44 mg/dL — ABNORMAL HIGH (ref 0.40–1.20)
GFR: 38.21 mL/min — ABNORMAL LOW (ref >60.00)
Glucose, Bld: 109 mg/dL — ABNORMAL HIGH (ref 70–99)
Potassium: 4.1 mEq/L (ref 3.5–5.1)
Sodium: 141 mEq/L (ref 135–145)
Total Bilirubin: 0.9 mg/dL (ref 0.2–1.2)
Total Protein: 7.4 g/dL (ref 6.0–8.3)

## 2021-04-25 LAB — CBC WITH DIFFERENTIAL/PLATELET
Basophils Absolute: 0 10*3/uL (ref 0.0–0.1)
Basophils Relative: 0.6 % (ref 0.0–3.0)
Eosinophils Absolute: 0.2 10*3/uL (ref 0.0–0.7)
Eosinophils Relative: 4.2 % (ref 0.0–5.0)
HCT: 36.7 % (ref 36.0–46.0)
Hemoglobin: 12.3 g/dL (ref 12.0–15.0)
Lymphocytes Relative: 29.3 % (ref 12.0–46.0)
Lymphs Abs: 1.6 10*3/uL (ref 0.7–4.0)
MCHC: 33.5 g/dL (ref 30.0–36.0)
MCV: 93 fl (ref 78.0–100.0)
Monocytes Absolute: 0.4 10*3/uL (ref 0.1–1.0)
Monocytes Relative: 8 % (ref 3.0–12.0)
Neutro Abs: 3.2 10*3/uL (ref 1.4–7.7)
Neutrophils Relative %: 57.9 % (ref 43.0–77.0)
Platelets: 81 10*3/uL — ABNORMAL LOW (ref 150.0–400.0)
RBC: 3.95 Mil/uL (ref 3.87–5.11)
RDW: 12.5 % (ref 11.5–15.5)
WBC: 5.5 10*3/uL (ref 4.0–10.5)

## 2021-04-25 LAB — LIPID PANEL
Cholesterol: 134 mg/dL (ref 0–200)
HDL: 49.4 mg/dL (ref >39.00)
LDL Cholesterol: 65 mg/dL (ref 0–99)
NonHDL: 85.05
Total CHOL/HDL Ratio: 3
Triglycerides: 100 mg/dL (ref 0.0–149.0)
VLDL: 20 mg/dL (ref 0.0–40.0)

## 2021-04-25 MED ORDER — BENAZEPRIL HCL 20 MG PO TABS
20.0000 mg | ORAL_TABLET | Freq: Every day | ORAL | 3 refills | Status: DC
Start: 2021-04-25 — End: 2021-10-31

## 2021-04-25 MED ORDER — METOPROLOL TARTRATE 50 MG PO TABS: 50.0000 mg | ORAL_TABLET | Freq: Two times a day (BID) | ORAL | 2 refills | Status: DC

## 2021-04-25 MED ORDER — HYDROCHLOROTHIAZIDE 12.5 MG PO TABS: 12.5000 mg | ORAL_TABLET | Freq: Every day | ORAL | 3 refills | Status: DC

## 2021-04-25 NOTE — Assessment & Plan Note (Addendum)
Chronic Regular exercise and healthy diet encouraged Check lipid panel  Continue rosuvastatin 40 mg daily, Zetia 10 mg daily

## 2021-04-25 NOTE — Assessment & Plan Note (Signed)
Chronic Experiencing increased PVCs Will increase metoprolol from 25 mg twice daily to 50 mg twice daily to see if that helps

## 2021-04-25 NOTE — Assessment & Plan Note (Signed)
Chronic CBC

## 2021-04-25 NOTE — Assessment & Plan Note (Signed)
Chronic Blood pressure well controlled, but having increased PVCs so we will adjust medication, which will hopefully help CMP Continue HCTZ 12.5 mg daily Decrease benazepril to 20 mg daily and increase metoprolol to 50 mg twice daily to help with PVCs Advised her to monitor her BP at home to make sure it is well controlled

## 2021-04-25 NOTE — Addendum Note (Signed)
Addended by: Binnie Rail on: 04/25/2021 09:41 PM   Modules accepted: Orders

## 2021-04-25 NOTE — Assessment & Plan Note (Signed)
Chronic Sugars have been well controlled She sees Dr. Cruzita Lederer in December

## 2021-04-25 NOTE — Assessment & Plan Note (Addendum)
Sleep ok now Currently not taking the trazodone at night Monitor off medication-can restart trazodone if needed

## 2021-04-26 ENCOUNTER — Encounter: Payer: Self-pay | Admitting: *Deleted

## 2021-04-26 NOTE — Progress Notes (Signed)
Referral received and chart under review for appt scheduling 

## 2021-04-29 ENCOUNTER — Telehealth: Payer: Self-pay | Admitting: Nurse Practitioner

## 2021-04-29 NOTE — Telephone Encounter (Signed)
Called and scheduled appointment for labs and New patient appt with Lattie Haw on 11/14 and 06/01/21. Patient is aware of appt and location.

## 2021-05-03 ENCOUNTER — Other Ambulatory Visit: Payer: 59

## 2021-05-11 ENCOUNTER — Ambulatory Visit
Admission: RE | Admit: 2021-05-11 | Discharge: 2021-05-11 | Disposition: A | Discharge status: Home / Self Care | Payer: 59 | Source: Ambulatory Visit | Attending: Internal Medicine | Admitting: Internal Medicine

## 2021-05-11 DIAGNOSIS — D696 Thrombocytopenia, unspecified: Secondary | ICD-10-CM

## 2021-05-23 ENCOUNTER — Other Ambulatory Visit: Payer: Self-pay | Admitting: *Deleted

## 2021-05-23 DIAGNOSIS — D696 Thrombocytopenia, unspecified: Secondary | ICD-10-CM

## 2021-05-23 NOTE — Progress Notes (Signed)
Labs orders entered for appt next week, CBCD, Smear and hold clot SST

## 2021-05-30 ENCOUNTER — Other Ambulatory Visit: Payer: Self-pay

## 2021-05-30 ENCOUNTER — Inpatient Hospital Stay: Payer: 59 | Attending: Oncology

## 2021-05-30 DIAGNOSIS — D696 Thrombocytopenia, unspecified: Secondary | ICD-10-CM | POA: Insufficient documentation

## 2021-05-30 DIAGNOSIS — E119 Type 2 diabetes mellitus without complications: Secondary | ICD-10-CM | POA: Insufficient documentation

## 2021-05-30 DIAGNOSIS — Z7985 Long-term (current) use of injectable non-insulin antidiabetic drugs: Secondary | ICD-10-CM | POA: Insufficient documentation

## 2021-05-30 DIAGNOSIS — E785 Hyperlipidemia, unspecified: Secondary | ICD-10-CM | POA: Insufficient documentation

## 2021-05-30 DIAGNOSIS — I10 Essential (primary) hypertension: Secondary | ICD-10-CM | POA: Insufficient documentation

## 2021-05-30 DIAGNOSIS — Z79899 Other long term (current) drug therapy: Secondary | ICD-10-CM | POA: Insufficient documentation

## 2021-05-30 DIAGNOSIS — Z7984 Long term (current) use of oral hypoglycemic drugs: Secondary | ICD-10-CM | POA: Diagnosis not present

## 2021-05-30 DIAGNOSIS — N289 Disorder of kidney and ureter, unspecified: Secondary | ICD-10-CM | POA: Diagnosis not present

## 2021-05-30 LAB — CBC WITH DIFFERENTIAL (CANCER CENTER ONLY)
Abs Immature Granulocytes: 0.01 10*3/uL (ref 0.00–0.07)
Basophils Absolute: 0.1 10*3/uL (ref 0.0–0.1)
Basophils Relative: 1 %
Eosinophils Absolute: 0.4 10*3/uL (ref 0.0–0.5)
Eosinophils Relative: 6 %
HCT: 39.4 % (ref 36.0–46.0)
Hemoglobin: 13 g/dL (ref 12.0–15.0)
Immature Granulocytes: 0 %
Lymphocytes Relative: 34 %
Lymphs Abs: 2.6 10*3/uL (ref 0.7–4.0)
MCH: 30.7 pg (ref 26.0–34.0)
MCHC: 33 g/dL (ref 30.0–36.0)
MCV: 92.9 fL (ref 80.0–100.0)
Monocytes Absolute: 0.7 10*3/uL (ref 0.1–1.0)
Monocytes Relative: 9 %
Neutro Abs: 3.7 10*3/uL (ref 1.7–7.7)
Neutrophils Relative %: 50 %
Platelet Count: 109 10*3/uL — ABNORMAL LOW (ref 150–400)
RBC: 4.24 MIL/uL (ref 3.87–5.11)
RDW: 12.4 % (ref 11.5–15.5)
WBC Count: 7.5 10*3/uL (ref 4.0–10.5)
nRBC: 0 % (ref 0.0–0.2)

## 2021-05-30 LAB — SAVE SMEAR(SSMR), FOR PROVIDER SLIDE REVIEW

## 2021-05-31 NOTE — Progress Notes (Signed)
New Hematology/Oncology Consult   Requesting MD: Dr. Billey Gosling  [phone]    Reason for Consult: Thrombocytopenia  HPI: [name] has been referred for evaluation of thrombocytopenia.  She had labs done 04/25/2021 with results as follows: Hemoglobin 12.3, hematocrit 36, MCV 93, white count 5.5, platelet count 81,000, creatinine 1.44, AST 55, ALT 37, bilirubin 0.9.  She had an abdominal ultrasound 05/11/2021 with spleen size and appearance within normal limits.  Liver echotexture noted to be heterogeneous compatible with history of hepatic steatosis.    Review of labs in the EMR shows a platelet count of 111,000 on 12/09/2008; 131,000 on 04/05/2012; 134,000 on 06/10/2013; 152,000 on 06/07/2014; 119,000 06/07/2015; 127,000 on 07/06/2016; 147,000 on 08/17/2016; 132,000 on 07/04/2017; 132,000 on 07/05/2018; 106,000 on 07/08/2019; 108,000 on 08/26/2020.  CBC in our office on 05/30/2021-platelet count 109,000.  She denies bruising/bleeding.  Past medical history includes hypertension, diabetes, recent diagnosis of "fatty liver disease".   Past Medical History:  Diagnosis Date   Allergy    Anxiety    Arthritis    Colitis    in her 77s; quiescent since   DM (diabetes mellitus) ([place])    HTN (hypertension)    Hyperlipidemia   :   Past Surgical History:  Procedure Laterality Date   CERVIX SURGERY  2001   Cryosurgery, Dr Connye Burkitt   COLONOSCOPY  2009   Negative, Dr. Delfin Edis   FOOT SURGERY     bone spurs resected, Dr Aida Puffer- 2019   KNEE ARTHROSCOPY  1980   Right   LAMINECTOMY  2007   X 2, Dr Gladstone Lighter  :   Current Outpatient Medications:    benazepril (LOTENSIN) 20 MG tablet, Take 1 tablet (20 mg total) by mouth daily., Disp: 90 tablet, Rfl: 3   ezetimibe (ZETIA) 10 MG tablet, TAKE ONE TABLET BY MOUTH DAILY, Disp: 30 tablet, Rfl: 4   glipiZIDE (GLUCOTROL) 5 MG tablet, Take by mouth 5 mg before dinner (Patient taking differently: Take by mouth 5 mg before dinner- only with big  meals, takes PRN), Disp: 90 tablet, Rfl: 5   glucose blood (ONETOUCH VERIO) test strip, Use to check blood sugar 1 time per day. Dx Code E11.9, Disp: 100 each, Rfl: 12   hydrochlorothiazide (HYDRODIURIL) 12.5 MG tablet, Take 1 tablet (12.5 mg total) by mouth daily., Disp: 90 tablet, Rfl: 3   metFORMIN (GLUCOPHAGE) 1000 MG tablet, Take 1 tablet (1,000 mg total) by mouth 2 (two) times daily., Disp: 180 tablet, Rfl: 3   metoprolol tartrate (LOPRESSOR) 50 MG tablet, Take 1 tablet (50 mg total) by mouth 2 (two) times daily., Disp: 180 tablet, Rfl: 2   rosuvastatin (CRESTOR) 40 MG tablet, Take 1 tablet (40 mg total) by mouth daily., Disp: 90 tablet, Rfl: 3   TRULICITY 3 GD/9.2EQ SOPN, INJECT 3 MG INTO THE SKIN ONCE A WEEK., Disp: 6 mL, Rfl: 3:    Allergies  Allergen Reactions   Atorvastatin     REACTION: ELEVATES LFT'S. Pt states she is currently taking Atorvastatin 40 mg (1/2 tablet daily) and does not recall hab=ving any issues with it. (07/14/14)   Tramadol Nausea And Vomiting   Codeine Nausea Only        Neomycin-Bacitracin Zn-Polymyx Rash   Onglyza [Saxagliptin Hydrochloride] Rash    This occurred after starting Kombiglyze . This was present as a rash over the hands and feet dorsally. She has been able to take metformin in the past as well as Janumet without problems.   Sulfamethoxazole-Trimethoprim Nausea  And Vomiting     Nausea was extreme.    :  FH: No family history of a blood disorder, no bleeding disorder, maternal aunt with pancreas cancer.  SOCIAL HISTORY:  She lives in [place].  She is divorced.  She has a daughter.  She works at [employer] for [employer].  No tobacco or alcohol use.  Review of Systems: No bruising or bleeding.  No fevers or sweats.  She describes her appetite is "fair".  She notes a decline in appetite coinciding with beginning Trulicity.  Intentional weight loss.  She denies pain.  No shortness of breath or cough.  No change in bowel habits.  She  reports being up-to-date on screening colonoscopy.  No bloody or black stools.  No urinary symptoms.  No hematuria.  She reports neuropathy symptoms in the feet, initially started after back surgery.  She feels diabetes is likely contributing as well.  No rash.  No joint pain or swelling.  Physical Exam:  Blood pressure (!) 105/58, pulse 96, temperature 98.1 F (36.7 C), temperature source Oral, resp. rate 20, height 5' 9"  (1.753 m), weight 192 lb 6.4 oz (87.3 kg), SpO2 100 %.  HEENT: No thrush or ulcers. Lungs: Lungs clear bilaterally. Cardiac: Regular rate and rhythm. Abdomen: Abdomen soft and nontender.  No hepatosplenomegaly. Vascular: No leg edema.  Calves soft and nontender. Lymph nodes: No palpable cervical, supraclavicular, axillary or inguinal lymph nodes. Skin: No rash, no ecchymoses.  LABS:   Recent Labs    05/30/21 1105  WBC 7.5  HGB 13.0  HCT 39.4  PLT 109*   Peripheral blood smear: Red blood cells-few ovalocytes, polychromasia not increased, 1 nucleated red cell; white blood cells unremarkable; platelets appear mildly decreased, mostly small, no platelet clumps.     RADIOLOGY:  US Abdomen Complete  Result Date: 05/13/2021 CLINICAL DATA:  Thrombocytopenia EXAM: ABDOMEN ULTRASOUND COMPLETE COMPARISON:  Abdominal ultrasound dated January 13, 2009 FINDINGS: Gallbladder: Multiple stones, largest measures 2.0 cm. No gallbladder wall thickening. No sonographic Murphy sign noted by sonographer. Common bile duct: Diameter: 5 mm Liver: No focal lesion identified. Heterogeneous parenchymal echogenicity. Portal vein is patent on color Doppler imaging with normal direction of blood flow towards the liver. IVC: Not visualized due to overlying bowel gas. Pancreas: Limited evaluation due to overlying bowel gas. Spleen: Size and appearance within normal limits. Right Kidney: Length: 10.5. Echogenicity within normal limits. No mass or hydronephrosis visualized. Left Kidney: Length: 10.4.  Echogenicity within normal limits. No mass or hydronephrosis visualized. Abdominal aorta: Not visualized overlying bowel gas. Other findings: None. IMPRESSION: Cholelithiasis.  No gallbladder wall thickening. Heterogeneous liver echotexture, compatible with history of hepatic steatosis. Electronically Signed   By: Yetta Glassman M.D.   On: 05/13/2021 09:18    Assessment and Plan:   Thrombocytopenia Diabetes Hypertension "Fatty liver" Elevated liver enzymes Renal insufficiency  [name] was referred for evaluation of thrombocytopenia.  She has chronic mild thrombocytopenia dating to 2010.  Dr. Benay Spice reviewed potential etiologies with her at today's visit.  There are no platelet clumps on the peripheral blood smear.  She is on no medications typically associated with thrombocytopenia.  We discussed the possibility of ITP or a lymphoproliferative disorder.  We will obtain additional labs today to include a myeloma panel and B12 level.  We will contact her once those results are available.  She will return for a CBC and follow-up visit in 6 months, sooner if needed.  Patient seen with Dr. Benay Spice.  Lattie Haw  Marcello Moores, NP 06/01/2021, 12:14 PM   This was a shared visit with Ned Card.  [name] was interviewed and examined.  I reviewed the peripheral blood smear.  [name] is referred for evaluation of chronic thrombocytopenia.  There is no obvious explanation for the thrombocytopenia.  The differential diagnosis includes chronic ITP, thrombocytopenia secondary to chronic liver disease, and less likely a bone marrow disorder.  I have a low clinical suspicion for a hematopoietic malignancy.  We obtained a myeloma panel today.  I recommend Dr. Quay Burow consider additional evaluation for chronic liver disease.  A hepatitis C antibody was negative in 2017.  A recent abdominal ultrasound did not reveal evidence of cirrhosis.  I do not recommend a diagnostic bone marrow biopsy at present.  I was  present for greater than 50% of today's visit.  I performed medical decision making.  Julieanne Manson, MD

## 2021-06-01 ENCOUNTER — Inpatient Hospital Stay: Payer: 59

## 2021-06-01 ENCOUNTER — Inpatient Hospital Stay: Payer: 59 | Admitting: Nurse Practitioner

## 2021-06-01 ENCOUNTER — Other Ambulatory Visit: Payer: Self-pay

## 2021-06-01 VITALS — BP 105/58 | HR 96 | Temp 98.1°F | Resp 20 | Ht 69.0 in | Wt 192.4 lb

## 2021-06-01 DIAGNOSIS — D696 Thrombocytopenia, unspecified: Secondary | ICD-10-CM

## 2021-06-01 LAB — VITAMIN B12: Vitamin B-12: 310 pg/mL (ref 180–914)

## 2021-06-02 ENCOUNTER — Telehealth: Payer: Self-pay | Admitting: Nurse Practitioner

## 2021-06-02 LAB — KAPPA/LAMBDA LIGHT CHAINS
Kappa free light chain: 92.7 mg/L — ABNORMAL HIGH (ref 3.3–19.4)
Kappa, lambda light chain ratio: 1.85 — ABNORMAL HIGH (ref 0.26–1.65)
Lambda free light chains: 50.2 mg/L — ABNORMAL HIGH (ref 5.7–26.3)

## 2021-06-02 NOTE — Telephone Encounter (Signed)
Scheduled appt per 11/16 los - mailed letter with appt date and time - lab and 6 month f/u

## 2021-06-06 ENCOUNTER — Encounter: Payer: Self-pay | Admitting: Internal Medicine

## 2021-06-06 ENCOUNTER — Other Ambulatory Visit: Payer: Self-pay | Admitting: Internal Medicine

## 2021-06-06 DIAGNOSIS — K760 Fatty (change of) liver, not elsewhere classified: Secondary | ICD-10-CM

## 2021-06-08 LAB — MULTIPLE MYELOMA PANEL, SERUM
Albumin SerPl Elph-Mcnc: 3.9 g/dL (ref 2.9–4.4)
Albumin/Glob SerPl: 1.1 (ref 0.7–1.7)
Alpha 1: 0.2 g/dL (ref 0.0–0.4)
Alpha2 Glob SerPl Elph-Mcnc: 0.8 g/dL (ref 0.4–1.0)
B-Globulin SerPl Elph-Mcnc: 1.4 g/dL — ABNORMAL HIGH (ref 0.7–1.3)
Gamma Glob SerPl Elph-Mcnc: 1.3 g/dL (ref 0.4–1.8)
Globulin, Total: 3.7 g/dL (ref 2.2–3.9)
IgA: 503 mg/dL — ABNORMAL HIGH (ref 87–352)
IgG (Immunoglobin G), Serum: 1301 mg/dL (ref 586–1602)
IgM (Immunoglobulin M), Srm: 101 mg/dL (ref 26–217)
Total Protein ELP: 7.6 g/dL (ref 6.0–8.5)

## 2021-06-13 ENCOUNTER — Encounter: Payer: Self-pay | Admitting: Nurse Practitioner

## 2021-06-14 ENCOUNTER — Telehealth: Payer: Self-pay | Admitting: Nurse Practitioner

## 2021-06-14 ENCOUNTER — Other Ambulatory Visit: Payer: Self-pay | Admitting: Internal Medicine

## 2021-06-14 NOTE — Telephone Encounter (Signed)
I contacted [name] to review the lab results from her visit 06/01/2021.  We discussed the polyclonal increase in immunoglobulins being consistent with inflammation.  There is no evidence of myeloma. Recommend she follow-up with primary MD/GI to evaluate for liver disease.  She reports she has been referred to GI and is currently awaiting an appointment.  She will follow-up here as scheduled.

## 2021-06-27 ENCOUNTER — Encounter: Payer: Self-pay | Admitting: Family Medicine

## 2021-06-30 ENCOUNTER — Other Ambulatory Visit: Payer: Self-pay

## 2021-06-30 ENCOUNTER — Ambulatory Visit: Payer: 59 | Admitting: Internal Medicine

## 2021-06-30 ENCOUNTER — Encounter: Payer: Self-pay | Admitting: Internal Medicine

## 2021-06-30 VITALS — BP 120/58 | HR 103 | Ht 69.0 in | Wt 184.8 lb

## 2021-06-30 DIAGNOSIS — E669 Obesity, unspecified: Secondary | ICD-10-CM | POA: Diagnosis not present

## 2021-06-30 DIAGNOSIS — E7849 Other hyperlipidemia: Secondary | ICD-10-CM

## 2021-06-30 DIAGNOSIS — E1151 Type 2 diabetes mellitus with diabetic peripheral angiopathy without gangrene: Secondary | ICD-10-CM | POA: Diagnosis not present

## 2021-06-30 LAB — POCT GLYCOSYLATED HEMOGLOBIN (HGB A1C): Hemoglobin A1C: 5.8 % — AB (ref 4.0–5.6)

## 2021-06-30 NOTE — Patient Instructions (Addendum)
Please continue: - Metformin 1000 mg with lunch and dinner - Trulicity 3 mg weekly  Decrease: - Glipizide 2.5 mg before a larger dinner  Please return in 6 months with your sugar log.

## 2021-06-30 NOTE — Addendum Note (Signed)
Addended by: Lauralyn Primes on: 06/30/2021 10:49 AM   Modules accepted: Orders

## 2021-06-30 NOTE — Progress Notes (Signed)
Patient ID: [name], female   DOB: 11/16/1955, 65 y.o.   MRN: [id number]  This visit occurred during the SARS-CoV-2 public health emergency.  Safety protocols were in place, including screening questions prior to the visit, additional usage of staff PPE, and extensive cleaning of exam room while observing appropriate contact time as indicated for disinfecting solutions.   HPI: [name] is a pleasant 65 y.o.-year-old female, presenting for follow-up for DM2, dx in 2000, non-insulin-dependent, uncontrolled, with complications (PVD, CKD, DR).  Last visit 5.5 months ago.  Interim history: No increased urination, blurry vision, nausea, chest pain. She lost 17 lbs since last OV >> eliminated starches, sweets, snacks. She recently had investigation for multiple myeloma >> ruled out. She was recently dx'ed with NASH.  After this, she started to adjust her diet and increase her exercise.  She bought a rowing machine and rows every day.  She is also walking.  Reviewed HbA1c levels: 01/13/2021: HbA1c 6.1% Lab Results  Component Value Date   HGBA1C 6.3 (A) 05/20/2020   HGBA1C 6.1 (A) 11/13/2019   HGBA1C 7.3 (A) 06/10/2019   Pt is on a regimen of: - Metformin 2000 mg with dinner - Glipizide 5 mg before dinner >> 2.5 mg before a larger dinner >> 5 >> 2.5-5 mg before dinner - Trulicity 1.5 >> 3 mg weekly She was on Farxiga 10 mg daily in am >> stopped 08/2017 after mixup at the pharmacy. She was on [employer] stopped covering it. She was on Kombiglyze = Metformin + Onglyza (Saxagliptin) - 1000-2.5 mg daily >> rash.  Pt checks her sugars 0-1x a day: - am: 65, 81-111, 127 >> 81-100 >> 74-121, 132 >> 61, 65, 74-133 - 2h after b'fast: 130, 218 >> n/c >> 130 >> n/c - before lunch: n/c >> 138 >> n/c - 2h after lunch:  170-175 >> n/c >> 180 >> 150 >> n/c - before dinner: 208 >> n/c >> 120-128 >> n/c >> 118 >> n/c - 2h after dinner: n/c >> 214 - bedtime: n/c - nighttime: n/c Lowest  sugar was: 92 >> 65 x2 >> 80 >> 74 >> 61; she has hypoglycemia awareness in the 80. Highest sugar was: 218 >> 127 >> 180 >> 214 >> 133.  Glucometer: One Touch Ultra  -+ Mild CKD, last BUN/creatinine:  Lab Results  Component Value Date   BUN 25 (H) 04/25/2021   CREATININE 1.44 (H) 04/25/2021   Lab Results  Component Value Date   GFRAA 85 04/22/2008   GFRAA 85 11/22/2006   Lab Results  Component Value Date   MICRALBCREAT 0.8 08/26/2020   MICRALBCREAT 0.7 06/07/2015   MICRALBCREAT 0.8 02/03/2015   MICRALBCREAT 0.5 06/05/2014   MICRALBCREAT 0.4 04/05/2012   MICRALBCREAT 0.6 11/01/2010   MICRALBCREAT 0.4 02/14/2010   MICRALBCREAT 5.1 04/22/2008   MICRALBCREAT 3.0 11/22/2006  On benazepril.  -+ HL. Last set of lipids: Lab Results  Component Value Date   CHOL 134 04/25/2021   HDL 49.40 04/25/2021   LDLCALC 65 04/25/2021   LDLDIRECT 149.9 02/14/2010   TRIG 100.0 04/25/2021   CHOLHDL 3 04/25/2021  On Lipitor 40 and Zetia 10.  - last eye exam was in 07/2020: No DR reportedly; she had hemorrhage in the left eye in the past.  -+ Numbness and tingling in her feet developed after spine surgery  She also has HTN, Had back surgery 2007. Back pain increased 02/2015 >> saw Dr Tamala Julian >> started Gabapentin >> disequilibrium. She  was on steroids then >> sugars higher. In 03/2015: spine steroid inj.  For Thanksgiving 2020, she cooked dinner for 30 families in need.  ROS: + see HPI Neurological: no tremors/+ numbness/+ tingling/no dizziness  I reviewed pt's medications, allergies, PMH, social hx, family hx, and changes were documented in the history of present illness. Otherwise, unchanged from my initial visit note.  Past Medical History:  Diagnosis Date   Allergy    Anxiety    Arthritis    Colitis    in her 39s; quiescent since   DM (diabetes mellitus) ([place])    HTN (hypertension)    Hyperlipidemia    Past Surgical History:  Procedure Laterality Date   CERVIX SURGERY  2001    Cryosurgery, Dr Connye Burkitt   COLONOSCOPY  2009   Negative, Dr. Delfin Edis   FOOT SURGERY     bone spurs resected, Dr Aida Puffer- 2019   KNEE ARTHROSCOPY  1980   Right   LAMINECTOMY  2007   X 2, Dr Gladstone Lighter   Social History   Social History   Marital Status: Divorced    Spouse Name: N/A   Number of Children: 1   Occupational History   [employer]   Social History Main Topics   Smoking status: Former Smoker    Quit date: 1996   Alcohol Use: Yes     Comment:  rarely   Drug Use: No   Current Outpatient Medications on File Prior to Visit  Medication Sig Dispense Refill   benazepril (LOTENSIN) 20 MG tablet Take 1 tablet (20 mg total) by mouth daily. 90 tablet 3   ezetimibe (ZETIA) 10 MG tablet TAKE ONE TABLET BY MOUTH DAILY 30 tablet 4   glipiZIDE (GLUCOTROL) 5 MG tablet Take by mouth 5 mg before dinner (Patient taking differently: Take by mouth 5 mg before dinner- only with big meals, takes PRN) 90 tablet 5   glucose blood (ONETOUCH VERIO) test strip Use to check blood sugar 1 time per day. Dx Code E11.9 100 each 12   hydrochlorothiazide (HYDRODIURIL) 12.5 MG tablet Take 1 tablet (12.5 mg total) by mouth daily. 90 tablet 3   hydrochlorothiazide (HYDRODIURIL) 25 MG tablet TAKE 1/2 TABLET BY MOUTH DAILY 15 tablet 1   metFORMIN (GLUCOPHAGE) 1000 MG tablet Take 1 tablet (1,000 mg total) by mouth 2 (two) times daily. 180 tablet 3   metoprolol tartrate (LOPRESSOR) 50 MG tablet Take 1 tablet (50 mg total) by mouth 2 (two) times daily. 180 tablet 2   rosuvastatin (CRESTOR) 40 MG tablet Take 1 tablet (40 mg total) by mouth daily. 90 tablet 3   TRULICITY 3 HB/7.1IR SOPN INJECT 3 MG INTO THE SKIN ONCE A WEEK. 6 mL 3   No current facility-administered medications on file prior to visit.   Allergies  Allergen Reactions   Atorvastatin     REACTION: ELEVATES LFT'S. Pt states she is currently taking Atorvastatin 40 mg (1/2 tablet daily) and does not recall hab=ving any  issues with it. (07/14/14)   Tramadol Nausea And Vomiting   Codeine Nausea Only        Neomycin-Bacitracin Zn-Polymyx Rash   Onglyza [Saxagliptin Hydrochloride] Rash    This occurred after starting Kombiglyze . This was present as a rash over the hands and feet dorsally. She has been able to take metformin in the past as well as Janumet without problems.   Sulfamethoxazole-Trimethoprim Nausea And Vomiting     Nausea was extreme.  Family History  Problem Relation Age of Onset   Diabetes Father    Hypertension Father    Heart attack Father 72       precipitated by bleeding ulcer   Diabetes Mother    Hypertension Mother    Coronary artery disease Mother    Heart attack Paternal Grandfather        ? 77   Heart attack Maternal Grandfather 68   Diabetes Maternal Grandmother    Stroke Maternal Grandmother 82   Cancer Neg Hx    Colon cancer Neg Hx    Esophageal cancer Neg Hx    Rectal cancer Neg Hx    Stomach cancer Neg Hx    PE: BP (!) 120/58 (BP Location: Right Arm, Patient Position: Sitting, Cuff Size: Normal)    Pulse (!) 103    Ht 5' 9"  (1.753 m)    Wt 184 lb 12.8 oz (83.8 kg)    SpO2 99%    BMI 27.29 kg/m  Body mass index is 27.29 kg/m.  Wt Readings from Last 3 Encounters:  06/30/21 184 lb 12.8 oz (83.8 kg)  06/01/21 192 lb 6.4 oz (87.3 kg)  04/25/21 201 lb (91.2 kg)   Constitutional: overweight, in NAD Eyes: PERRLA, EOMI, no exophthalmos ENT: moist mucous membranes, no thyromegaly, no cervical lymphadenopathy Cardiovascular: tachycardia, RR, No MRG Respiratory: CTA B Musculoskeletal: no deformities, strength intact in all 4 Skin: moist, warm, no rashes Neurological: no tremor with outstretched hands, DTR normal in all 4  ASSESSMENT: 1. DM2, non-insulin-dependent, uncontrolled, with complications - PVD - CKD - DR  2. HL  3. Obesity class 1  PLAN:  1. Patient with longstanding, previously uncontrolled, type 2 diabetes, with significant improvement in  control after she started to improve her diet and be compliant with her diabetic medications.  At last visit, while on metformin and GLP-1 receptor agonist, her HbA1c decreased to 6.1%, excellent.  She tolerates Trulicity well.  At that time, sugars were better, with the vast majority of them being at goal but occasionally higher after lunch.  I advised her to check some blood sugars after dinner but I did advise her to try to decrease the dose of glipizide with smaller meals since she did have some lower blood sugars, in the 70s, in the morning.  Otherwise, we did not change her regimen. -At this visit, patient returns after being diagnosed with NASH.  She lost 17 pounds due to changing her diet and starting consistent exercise.  She eliminated starches, sweets, snacks.  She is exercising daily-walking, rowing.  She feels great and her sugars improved.  In fact, she has some lows in the morning, in the 60s so I advised her to take the glipizide only if she has a larger dinner and then to only take 2.5 mg.  As of now, she is taking 5 mg with larger meals.  Otherwise, we can continue the same regimen.  I explained that Trulicity will also help with NASH. - I suggested to:  Patient Instructions  Please continue: - Metformin 1000 mg with lunch and dinner - Trulicity 3 mg weekly  Decrease: - Glipizide 2.5 mg before a larger dinner  Please return in 6 months with your sugar log.  - we checked her HbA1c: 5.8% (lower) - advised to check sugars at different times of the day - 1x a day, rotating check times - advised for yearly eye exams >> she is UTD - return to clinic in 6 months  2. HL -Reviewed latest lipid panel from 04/2021: Fractions at goal: Lab Results  Component Value Date   CHOL 134 04/25/2021   HDL 49.40 04/25/2021   LDLCALC 65 04/25/2021   LDLDIRECT 149.9 02/14/2010   TRIG 100.0 04/25/2021   CHOLHDL 3 04/25/2021  -On Lipitor 40 mg daily and Zetia 10 mg daily.  3. Obesity class  1 -We will continue Trulicity which should also with weight loss -At last visit, weight was stable, now lost 17 lbs!  I congratulated her.  Continue exercise and diet.  Philemon Kingdom, MD PhD [facility]

## 2021-07-04 ENCOUNTER — Ambulatory Visit: Payer: 59 | Admitting: Internal Medicine

## 2021-07-04 NOTE — Telephone Encounter (Signed)
Patient message sent

## 2021-07-05 ENCOUNTER — Encounter: Payer: Self-pay | Admitting: Internal Medicine

## 2021-07-05 ENCOUNTER — Encounter: Payer: Self-pay | Admitting: Gastroenterology

## 2021-07-06 NOTE — Telephone Encounter (Signed)
PT picked up One Touch Vezio

## 2021-08-10 ENCOUNTER — Encounter: Payer: Self-pay | Admitting: Gastroenterology

## 2021-08-10 ENCOUNTER — Other Ambulatory Visit (INDEPENDENT_AMBULATORY_CARE_PROVIDER_SITE_OTHER): Payer: 59

## 2021-08-10 ENCOUNTER — Ambulatory Visit: Payer: 59 | Admitting: Gastroenterology

## 2021-08-10 VITALS — BP 118/60 | HR 75 | Ht 69.0 in | Wt 181.0 lb

## 2021-08-10 DIAGNOSIS — K8020 Calculus of gallbladder without cholecystitis without obstruction: Secondary | ICD-10-CM | POA: Diagnosis not present

## 2021-08-10 DIAGNOSIS — R7989 Other specified abnormal findings of blood chemistry: Secondary | ICD-10-CM

## 2021-08-10 LAB — IBC + FERRITIN
Ferritin: 102.4 ng/mL (ref 10.0–291.0)
Iron: 81 ug/dL (ref 42–145)
Saturation Ratios: 19.4 % — ABNORMAL LOW (ref 20.0–50.0)
TIBC: 418.6 ug/dL (ref 250.0–450.0)
Transferrin: 299 mg/dL (ref 212.0–360.0)

## 2021-08-10 LAB — HEPATIC FUNCTION PANEL
ALT: 45 U/L — ABNORMAL HIGH (ref 0–35)
AST: 68 U/L — ABNORMAL HIGH (ref 0–37)
Albumin: 4.2 g/dL (ref 3.5–5.2)
Alkaline Phosphatase: 82 U/L (ref 39–117)
Bilirubin, Direct: 0.2 mg/dL (ref 0.0–0.3)
Total Bilirubin: 0.9 mg/dL (ref 0.2–1.2)
Total Protein: 7.5 g/dL (ref 6.0–8.3)

## 2021-08-10 LAB — PROTIME-INR
INR: 1.2 ratio — ABNORMAL HIGH (ref 0.8–1.0)
Prothrombin Time: 12.6 s (ref 9.6–13.1)

## 2021-08-10 LAB — SEDIMENTATION RATE: Sed Rate: 33 mm/hr — ABNORMAL HIGH (ref 0–30)

## 2021-08-10 NOTE — Progress Notes (Signed)
[name]    [id number]    09/07/1955  Primary Care Physician:Burns, Claudina Lick, MD  Referring Physician: Binnie Rail, MD [place],  [place] [address]   Chief complaint:  abnormal LFT, fatty liver  HPI: 66 year old very pleasant female with type 2 diabetes, CKD here for follow-up for abnormal LFT and fatty liver  She has mild elevation in transaminases, most recent labs from October 2022 Hepatic Function Latest Ref Rng & Units 04/25/2021 08/26/2020 07/08/2019  Total Protein 6.0 - 8.3 g/dL 7.4 7.9 7.0  Albumin 3.5 - 5.2 g/dL 4.1 4.4 4.1  AST 0 - 37 U/L 55(H) 43(H) 57(H)  ALT 0 - 35 U/L 37(H) 30 41(H)  Alk Phosphatase 39 - 117 U/L 86 91 90  Total Bilirubin 0.2 - 1.2 mg/dL 0.9 1.5(H) 0.9  Bilirubin, Direct 0.0 - 0.3 mg/dL - - -    Last hemoglobin A1c was 5.8  She has changed her diet significantly in the past 3 months, has lost about 20 pounds She does not drink any alcohol Denies any herbal medicines or supplements.  She recently started using cold and milk in the past month which is a combination of turmeric, nut based milk and black pepper  Denies any nausea, vomiting, abdominal pain, melena or bright red blood per rectum  No family history of autoimmune disease.  No skin rash or small joint pain.  Abdominal ultrasound May 11, 2021 Cholelithiasis.  No gallbladder wall thickening. Heterogeneous liver echotexture, compatible with history of hepatic steatosis.  Colonoscopy 02/10/21 - One 4 mm polyp in the rectum, removed with a cold snare. Resected and retrieved.  [Polyp removed was hyperplastic] - Non-bleeding external and internal hemorrhoids. - The examination was otherwise normal.     Outpatient Encounter Medications as of 08/10/2021  Medication Sig   benazepril (LOTENSIN) 20 MG tablet Take 1 tablet (20 mg total) by mouth daily. (Patient taking differently: Take 10 mg by mouth daily.)   ezetimibe (ZETIA) 10 MG tablet TAKE ONE  TABLET BY MOUTH DAILY   glipiZIDE (GLUCOTROL) 5 MG tablet Take by mouth 5 mg before dinner (Patient taking differently: Take by mouth 5 mg before dinner- only with big meals, takes PRN)   glucose blood (ONETOUCH VERIO) test strip Use to check blood sugar 1 time per day. Dx Code E11.9   hydrochlorothiazide (HYDRODIURIL) 12.5 MG tablet Take 1 tablet (12.5 mg total) by mouth daily.   hydrochlorothiazide (HYDRODIURIL) 25 MG tablet TAKE 1/2 TABLET BY MOUTH DAILY   metFORMIN (GLUCOPHAGE) 1000 MG tablet Take 1 tablet (1,000 mg total) by mouth 2 (two) times daily.   metoprolol tartrate (LOPRESSOR) 50 MG tablet Take 1 tablet (50 mg total) by mouth 2 (two) times daily.   rosuvastatin (CRESTOR) 40 MG tablet Take 1 tablet (40 mg total) by mouth daily.   TRULICITY 3 WN/0.2VO SOPN INJECT 3 MG INTO THE SKIN ONCE A WEEK.   No facility-administered encounter medications on file as of 08/10/2021.    Allergies as of 08/10/2021 - Review Complete 08/10/2021  Allergen Reaction Noted   Atorvastatin  06/14/2007   Tramadol Nausea And Vomiting 08/26/2020   Codeine Nausea Only 06/14/2007   Neomycin-bacitracin zn-polymyx Rash 06/14/2007   Onglyza [saxagliptin hydrochloride] Rash 09/01/2011   Sulfamethoxazole-trimethoprim Nausea And Vomiting 06/14/2007    Past Medical History:  Diagnosis Date   Allergy    Anxiety    Arthritis    Colitis  in her 70s; quiescent since   DM (diabetes mellitus) ([place])    HTN (hypertension)    Hyperlipidemia     Past Surgical History:  Procedure Laterality Date   CERVIX SURGERY  2001   Cryosurgery, Dr Connye Burkitt   COLONOSCOPY  2009   Negative, Dr. Delfin Edis   FOOT SURGERY     bone spurs resected, Dr Aida Puffer- 2019   [place]   Right   LAMINECTOMY  2007   X 2, Dr Gladstone Lighter    Family History  Problem Relation Age of Onset   Diabetes Father    Hypertension Father    Heart attack Father 28       precipitated by bleeding ulcer   Diabetes Mother     Hypertension Mother    Coronary artery disease Mother    Heart attack Paternal Grandfather        ? 58   Heart attack Maternal Grandfather 68   Diabetes Maternal Grandmother    Stroke Maternal Grandmother 82   Cancer Neg Hx    Colon cancer Neg Hx    Esophageal cancer Neg Hx    Rectal cancer Neg Hx    Stomach cancer Neg Hx     Social History   Socioeconomic History   Marital status: Divorced    Spouse name: Not on file   Number of children: 1   Years of education: Not on file   Highest education level: Not on file  Occupational History    Employer: [employer]  Tobacco Use   Smoking status: Former    Types: Cigarettes    Quit date: 07/17/1977    Years since quitting: 44.0   Smokeless tobacco: Never   Tobacco comments:    Smoked in [place] ; 1975-1979, up to 1/2 ppd  Vaping Use   Vaping Use: Never used  Substance and Sexual Activity   Alcohol use: Never   Drug use: No   Sexual activity: Not on file  Other Topics Concern   Not on file  Social History Narrative   Not on file   Social Determinants of Health   Financial Resource Strain: Not on file  Food Insecurity: Not on file  Transportation Needs: Not on file  Physical Activity: Not on file  Stress: Not on file  Social Connections: Not on file  Intimate Partner Violence: Not on file      Review of systems: All other review of systems negative except as mentioned in the HPI.   Physical Exam: Vitals:   08/10/21 0816  BP: 118/60  Pulse: 75   Body mass index is 26.73 kg/m. Gen:      No acute distress HEENT:  sclera anicteric Abd:      soft, non-tender; no palpable masses, no distension Ext:    No edema Neuro: alert and oriented x 3 Psych: normal mood and affect  Data Reviewed:  Reviewed labs, radiology imaging, old records and pertinent past GI work up   Assessment and Plan/Recommendations:  66 year old very pleasant female with abnormal LFT, evidence of cholelithiasis  and heterogeneous parenchymal texture consistent with fatty liver on ultrasound  Check ANA, ferritin, TTG IgA antibody, hepatitis B surface antigen and surface antibody, hep A total antibody, ESR to exclude any other etiology for chronic liver disease other than Non alcohol fatty liver disease  Discussed in detail dietary changes, provided reading material for low-carb diet Continue with daily exercise We will repeat LFT today  Obtain HIDA  scan to exclude chronic cholecystitis  Return in 6 months  This visit required >40 minutes of patient care (this includes precharting, chart review, review of results, face-to-face time used for counseling as well as treatment plan and follow-up. The patient was provided an opportunity to ask questions and all were answered. The patient agreed with the plan and demonstrated an understanding of the instructions.  Damaris Hippo , MD    CC: Binnie Rail, MD

## 2021-08-10 NOTE — Patient Instructions (Addendum)
You have been scheduled for a HIDA scan at [facility] (1st floor) on 08/18/2021. Please arrive 15 minutes prior to your scheduled appointment at  9:92EQ Make certain not to have anything to eat or drink at least 6 hours prior to your test. Should this appointment date or time not work well for you, please call radiology scheduling at [phone].  _____________________________________________________________________ hepatobiliary (HIDA) scan is an imaging procedure used to diagnose problems in the liver, gallbladder and bile ducts. In the HIDA scan, a radioactive chemical or tracer is injected into a vein in your arm. The tracer is handled by the liver like bile. Bile is a fluid produced and excreted by your liver that helps your digestive system break down fats in the foods you eat. Bile is stored in your gallbladder and the gallbladder releases the bile when you eat a meal. A special nuclear medicine scanner (gamma camera) tracks the flow of the tracer from your liver into your gallbladder and small intestine.  During your HIDA scan  You'll be asked to change into a hospital gown before your HIDA scan begins. Your health care team will position you on a table, usually on your back. The radioactive tracer is then injected into a vein in your arm.The tracer travels through your bloodstream to your liver, where it's taken up by the bile-producing cells. The radioactive tracer travels with the bile from your liver into your gallbladder and through your bile ducts to your small intestine.You may feel some pressure while the radioactive tracer is injected into your vein. As you lie on the table, a special gamma camera is positioned over your abdomen taking pictures of the tracer as it moves through your body. The gamma camera takes pictures continually for about an hour. You'll need to keep still during the HIDA scan. This can become uncomfortable, but you may find that you can lessen the discomfort by  taking deep breaths and thinking about other things. Tell your health care team if you're uncomfortable. The radiologist will watch on a computer the progress of the radioactive tracer through your body. The HIDA scan may be stopped when the radioactive tracer is seen in the gallbladder and enters your small intestine. This typically takes about an hour. In some cases extra imaging will be performed if original images aren't satisfactory, if morphine is given to help visualize the gallbladder or if the medication CCK is given to look at the contraction of the gallbladder. This test typically takes 2 hours to complete. ________________________________________________________________________   Your provider has requested that you go to the basement level for lab work before leaving today. Press "B" on the elevator. The lab is located at the first door on the left as you exit the elevator.   LOW Carb Diet  Continue with daily Exercise  If you are age 8 or older, your body mass index should be between 23-30. Your Body mass index is 26.73 kg/m. If this is out of the aforementioned range listed, please consider follow up with your Primary Care Provider.  If you are age 67 or younger, your body mass index should be between 19-25. Your Body mass index is 26.73 kg/m. If this is out of the aformentioned range listed, please consider follow up with your Primary Care Provider.   ________________________________________________________  The [place] [facility] providers would like to encourage you to use [facility] to communicate with providers for non-urgent requests or questions.  Due to long hold times on the telephone, sending  your provider a message by [facility] may be a faster and more efficient way to get a response.  Please allow 48 business hours for a response.  Please remember that this is for non-urgent requests.  _______________________________________________________   Follow up in 6 months  Due to recent  changes in healthcare laws, you may see the results of your imaging and laboratory studies on MyChart before your provider has had a chance to review them.  We understand that in some cases there may be results that are confusing or concerning to you. Not all laboratory results come back in the same time frame and the provider may be waiting for multiple results in order to interpret others.  Please give [place] 48 hours in order for your provider to thoroughly review all the results before contacting the office for clarification of your results.    Thank you for choosing [facility]  Karleen Hampshire [place]and[place],MD

## 2021-08-11 LAB — HEPATITIS B SURFACE ANTIGEN: Hepatitis B Surface Ag: NONREACTIVE

## 2021-08-11 LAB — TISSUE TRANSGLUTAMINASE, IGA: (tTG) Ab, IgA: <1 U/mL

## 2021-08-11 LAB — HEPATITIS B SURFACE ANTIBODY,QUALITATIVE: Hep B S Ab: NONREACTIVE

## 2021-08-11 LAB — HEPATITIS A ANTIBODY, TOTAL: Hepatitis A AB,Total: NONREACTIVE

## 2021-08-12 ENCOUNTER — Other Ambulatory Visit: Payer: Self-pay | Admitting: Internal Medicine

## 2021-08-18 ENCOUNTER — Ambulatory Visit (HOSPITAL_COMMUNITY)
Admission: RE | Admit: 2021-08-18 | Discharge: 2021-08-18 | Disposition: A | Discharge status: Home / Self Care | Payer: 59 | Source: Ambulatory Visit | Attending: Gastroenterology | Admitting: Gastroenterology

## 2021-08-18 ENCOUNTER — Other Ambulatory Visit: Payer: Self-pay

## 2021-08-18 DIAGNOSIS — K8020 Calculus of gallbladder without cholecystitis without obstruction: Secondary | ICD-10-CM | POA: Diagnosis present

## 2021-08-18 DIAGNOSIS — R7989 Other specified abnormal findings of blood chemistry: Secondary | ICD-10-CM | POA: Insufficient documentation

## 2021-08-18 MED ORDER — TECHNETIUM TC 99M MEBROFENIN IV KIT
5.4000 | PACK | Freq: Once | INTRAVENOUS | Status: AC
  Administered 2021-08-18: 5.4 via INTRAVENOUS

## 2021-08-24 ENCOUNTER — Telehealth: Payer: Self-pay

## 2021-08-24 NOTE — Telephone Encounter (Signed)
Mail box full on first phone #. Left message to please call back, on second phone #.

## 2021-08-24 NOTE — Telephone Encounter (Signed)
-----   Message from Mauri Pole, MD sent at 08/24/2021 10:26 AM EST ----- No evidence of gallbladder dysfunction, otherwise unremarkable exam.

## 2021-09-01 ENCOUNTER — Encounter: Payer: 59 | Admitting: Internal Medicine

## 2021-10-16 ENCOUNTER — Other Ambulatory Visit: Payer: Self-pay | Admitting: Internal Medicine

## 2021-10-26 ENCOUNTER — Encounter: Payer: 59 | Admitting: Internal Medicine

## 2021-10-30 ENCOUNTER — Encounter: Payer: Self-pay | Admitting: Internal Medicine

## 2021-10-30 NOTE — Progress Notes (Signed)
? ? ?Subjective:  ? ? Patient ID: [name], female    DOB: [birth year], 66 y.o.   MRN: [id number] ? ? ?This visit occurred during the SARS-CoV-2 public health emergency.  Safety protocols were in place, including screening questions prior to the visit, additional usage of staff PPE, and extensive cleaning of exam room while observing appropriate contact time as indicated for disinfecting solutions. ? ? ? ?HPI ?[name] is here for  ?Chief Complaint  ?Patient presents with  ? Annual Exam  ?  Patient reports low blood pressure readings at home  ? ? ?Since she was here last she has lost a significant amount of weight.  She has been trying.  She gave up sugars, she reads labels.  She is not eating most processed foods.  She has given up most meat.  Energy is up.  She has few back issues - but it depends on what she does.   ? ?BP at home is low -- lightheaded if she stands up quickly.   ? ? ?Medications and allergies reviewed with patient and updated if appropriate. ? ?Current Outpatient Medications on File Prior to Visit  ?Medication Sig Dispense Refill  ? ezetimibe (ZETIA) 10 MG tablet TAKE ONE TABLET BY MOUTH DAILY 30 tablet 4  ? glipiZIDE (GLUCOTROL) 5 MG tablet Take by mouth 5 mg before dinner (Patient taking differently: Take by mouth 5 mg before dinner- only with big meals, takes PRN) 90 tablet 5  ? glucose blood (ONETOUCH VERIO) test strip Use to check blood sugar 1 time per day. Dx Code E11.9 100 each 12  ? metFORMIN (GLUCOPHAGE) 1000 MG tablet Take 1 tablet (1,000 mg total) by mouth 2 (two) times daily. 180 tablet 3  ? rosuvastatin (CRESTOR) 40 MG tablet Take 1 tablet (40 mg total) by mouth daily. 90 tablet 3  ? TRULICITY 3 NH/6.5BX SOPN INJECT 3 MG INTO THE SKIN ONCE A WEEK. 6 mL 3  ? ?No current facility-administered medications on file prior to visit.  ? ? ?Review of Systems  ?Constitutional:  Negative for fever.  ?HENT:  Positive for postnasal drip.   ?     Dry mouth  ?Eyes:  Positive for photophobia (occ  - bright lights). Negative for visual disturbance.  ?Respiratory:  Negative for cough, shortness of breath and wheezing.   ?Cardiovascular:  Positive for leg swelling (left ankle only, chronic). Negative for chest pain and palpitations.  ?Gastrointestinal:  Positive for abdominal pain (Typically no pain.  Since recent episode of diarrhea has some left mid quadrant tenderness that is improving) and diarrhea (episodic - can last two weeks - does not occur often). Negative for blood in stool, constipation and nausea.  ?     No gerd  ?Genitourinary:  Negative for dysuria.  ?Musculoskeletal:  Positive for arthralgias and back pain.  ?Skin:  Negative for rash.  ?Neurological:  Positive for light-headedness. Negative for headaches.  ?Psychiatric/Behavioral:  Negative for dysphoric mood. The patient is nervous/anxious.   ? ?   ?Objective:  ? ?Vitals:  ? 10/31/21 1007  ?BP: 118/60  ?Pulse: (!) 106  ?Temp: 98.2 ?F (36.8 ?C)  ?SpO2: 98%  ? ?Filed Weights  ? 10/31/21 1007  ?Weight: 167 lb (75.8 kg)  ? ?Body mass index is 24.66 kg/m?. ? ?BP Readings from Last 3 Encounters:  ?10/31/21 118/60  ?08/10/21 118/60  ?06/30/21 (!) 120/58  ? ? ?Wt Readings from Last 3 Encounters:  ?10/31/21 167 lb (75.8 kg)  ?08/10/21 181 lb (  82.1 kg)  ?06/30/21 184 lb 12.8 oz (83.8 kg)  ? ? ? ?  08/26/2020  ? 10:26 AM 07/08/2019  ?  9:29 AM 07/04/2017  ?  8:12 AM 06/02/2013  ?  2:57 PM  ?Depression screen PHQ 2/9  ?Decreased Interest 1 0 0 0  ?Down, Depressed, Hopeless 2 0 0 0  ?PHQ - 2 Score 3 0 0 0  ?Altered sleeping 3     ?Tired, decreased energy 1     ?Change in appetite 3     ?Feeling bad or failure about yourself  0     ?Trouble concentrating 0     ?Moving slowly or fidgety/restless 0     ?Suicidal thoughts 0     ?PHQ-9 Score 10     ?Difficult doing work/chores Somewhat difficult     ? ? ? ? ?  08/26/2020  ? 10:33 AM  ?GAD 7 : Generalized Anxiety Score  ?Nervous, Anxious, on Edge 2  ?Control/stop worrying 3  ?Worry too much - different things 3   ?Trouble relaxing 3  ?Restless 0  ?Easily annoyed or irritable 0  ?Afraid - awful might happen 1  ?Total GAD 7 Score 12  ?Anxiety Difficulty Somewhat difficult  ? ? ? ? ?  ?Physical Exam ?Constitutional: She appears well-developed and well-nourished. No distress.  ?HENT:  ?Head: Normocephalic and atraumatic.  ?Right Ear: External ear normal. Normal ear canal and TM ?Left Ear: External ear normal.  Normal ear canal and TM ?Mouth/Throat: Oropharynx is clear and moist.  ?Eyes: Conjunctivae and EOM are normal.  ?Neck: Neck supple. No tracheal deviation present. No thyromegaly present.  ?Right carotid bruit  ?Cardiovascular: Normal rate, regular rhythm and normal heart sounds.   ?No murmur heard.  No edema. ?Pulmonary/Chest: Effort normal and breath sounds normal. No respiratory distress. She has no wheezes. She has no rales.  ?Breast: deferred   ?Abdominal: Soft. She exhibits no distension.  She has slight tenderness left mid quadrant with palpation if laying on her side.  No rebound or guarding.  There is no other tenderness.  ?Lymphadenopathy: She has no cervical adenopathy.  ?Skin: Skin is warm and dry. She is not diaphoretic.  ?Psychiatric: She has a normal mood and affect. Her behavior is normal.  ? ? ? ?Lab Results  ?Component Value Date  ? WBC 7.5 05/30/2021  ? HGB 13.0 05/30/2021  ? HCT 39.4 05/30/2021  ? PLT 109 (L) 05/30/2021  ? GLUCOSE 109 (H) 04/25/2021  ? CHOL 134 04/25/2021  ? TRIG 100.0 04/25/2021  ? HDL 49.40 04/25/2021  ? LDLDIRECT 149.9 02/14/2010  ? [place] 65 04/25/2021  ? ALT 45 (H) 08/10/2021  ? AST 68 (H) 08/10/2021  ? NA 141 04/25/2021  ? K 4.1 04/25/2021  ? CL 105 04/25/2021  ? CREATININE 1.44 (H) 04/25/2021  ? BUN 25 (H) 04/25/2021  ? CO2 26 04/25/2021  ? TSH 0.94 08/26/2020  ? INR 1.2 (H) 08/10/2021  ? HGBA1C 5.8 (A) 06/30/2021  ? MICROALBUR 1.3 08/26/2020  ? ? ? ? ?   ?Assessment & Plan:  ? ?Physical exam: ?Screening blood work  ordered ?Exercise  rowing machine ?Weight   working on weight  loss-has lost a significant amount of weight. ?Substance abuse  none ? ? ?Reviewed recommended immunizations. ? ? ?Health Maintenance  ?Topic Date Due  ? Zoster Vaccines- Shingrix (2 of 2) 08/04/2019  ? OPHTHALMOLOGY EXAM  08/23/2019  ? Pneumonia Vaccine 61+ Years old (2 - PCV) 04/05/2020  ?  MAMMOGRAM  05/29/2020  ? DEXA SCAN  Never done  ? FOOT EXAM  08/26/2021  ? PAP SMEAR-Modifier  11/16/2021  ? COVID-19 Vaccine (5 - Booster for [place] series) 11/15/2021 (Originally 04/19/2021)  ? HEMOGLOBIN A1C  12/29/2021  ? INFLUENZA VACCINE  02/14/2022  ? TETANUS/TDAP  09/14/2024  ? COLONOSCOPY (Pts 45-70yr Insurance coverage will need to be confirmed)  02/10/2026  ? Hepatitis C Screening  Completed  ? HIV Screening  Completed  ? HPV VACCINES  Aged Out  ?  ? ? ? ? ? ? ?See Problem List for Assessment and Plan of chronic medical problems. ? ? ? ? ? ?

## 2021-10-30 NOTE — Patient Instructions (Addendum)
? ?Carotid artery ultrasound ordered.  ? ? ?Blood work was ordered.   ? ? ?Medications changes include :   stop hctz.  Decrease benazepril to 10 mg daily.  Take metoprolol 25 mg daily, start aspirin 81 mg daily.  ? ? ?Your prescription(s) have been sent to your pharmacy.  ? ? ?Return in about 6 months (around 05/02/2022) for follow up. ? ? ?Health Maintenance, Female ?Adopting a healthy lifestyle and getting preventive care are important in promoting health and wellness. Ask your health care provider about: ?The right schedule for you to have regular tests and exams. ?Things you can do on your own to prevent diseases and keep yourself healthy. ?What should I know about diet, weight, and exercise? ?Eat a healthy diet ? ?Eat a diet that includes plenty of vegetables, fruits, low-fat dairy products, and lean protein. ?Do not eat a lot of foods that are high in solid fats, added sugars, or sodium. ?Maintain a healthy weight ?Body mass index (BMI) is used to identify weight problems. It estimates body fat based on height and weight. Your health care provider can help determine your BMI and help you achieve or maintain a healthy weight. ?Get regular exercise ?Get regular exercise. This is one of the most important things you can do for your health. Most adults should: ?Exercise for at least 150 minutes each week. The exercise should increase your heart rate and make you sweat (moderate-intensity exercise). ?Do strengthening exercises at least twice a week. This is in addition to the moderate-intensity exercise. ?Spend less time sitting. Even light physical activity can be beneficial. ?Watch cholesterol and blood lipids ?Have your blood tested for lipids and cholesterol at 66 years of age, then have this test every 5 years. ?Have your cholesterol levels checked more often if: ?Your lipid or cholesterol levels are high. ?You are older than 66 years of age. ?You are at high risk for heart disease. ?What should I know about  cancer screening? ?Depending on your health history and family history, you may need to have cancer screening at various ages. This may include screening for: ?Breast cancer. ?Cervical cancer. ?Colorectal cancer. ?Skin cancer. ?Lung cancer. ?What should I know about heart disease, diabetes, and high blood pressure? ?Blood pressure and heart disease ?High blood pressure causes heart disease and increases the risk of stroke. This is more likely to develop in people who have high blood pressure readings or are overweight. ?Have your blood pressure checked: ?Every 3-5 years if you are 2-59 years of age. ?Every year if you are 57 years old or older. ?Diabetes ?Have regular diabetes screenings. This checks your fasting blood sugar level. Have the screening done: ?Once every three years after age 51 if you are at a normal weight and have a low risk for diabetes. ?More often and at a younger age if you are overweight or have a high risk for diabetes. ?What should I know about preventing infection? ?Hepatitis B ?If you have a higher risk for hepatitis B, you should be screened for this virus. Talk with your health care provider to find out if you are at risk for hepatitis B infection. ?Hepatitis C ?Testing is recommended for: ?Everyone born from [birth year] through 1965. ?Anyone with known risk factors for hepatitis C. ?Sexually transmitted infections (STIs) ?Get screened for STIs, including gonorrhea and chlamydia, if: ?You are sexually active and are younger than 66 years of age. ?You are older than 66 years of age and your health care provider tells  you that you are at risk for this type of infection. ?Your sexual activity has changed since you were last screened, and you are at increased risk for chlamydia or gonorrhea. Ask your health care provider if you are at risk. ?Ask your health care provider about whether you are at high risk for HIV. Your health care provider may recommend a prescription medicine to help prevent HIV  infection. If you choose to take medicine to prevent HIV, you should first get tested for HIV. You should then be tested every 3 months for as long as you are taking the medicine. ?Pregnancy ?If you are about to stop having your period (premenopausal) and you may become pregnant, seek counseling before you get pregnant. ?Take 400 to 800 micrograms (mcg) of folic acid every day if you become pregnant. ?Ask for birth control (contraception) if you want to prevent pregnancy. ?Osteoporosis and menopause ?Osteoporosis is a disease in which the bones lose minerals and strength with aging. This can result in bone fractures. If you are 38 years old or older, or if you are at risk for osteoporosis and fractures, ask your health care provider if you should: ?Be screened for bone loss. ?Take a calcium or vitamin D supplement to lower your risk of fractures. ?Be given hormone replacement therapy (HRT) to treat symptoms of menopause. ?Follow these instructions at home: ?Alcohol use ?Do not drink alcohol if: ?Your health care provider tells you not to drink. ?You are pregnant, may be pregnant, or are planning to become pregnant. ?If you drink alcohol: ?Limit how much you have to: ?0-1 drink a day. ?Know how much alcohol is in your drink. In the [place] one drink equals one 12 oz bottle of beer (355 mL), one 5 oz glass of wine (148 mL), or one 1? oz glass of hard liquor (44 mL). ?Lifestyle ?Do not use any products that contain nicotine or tobacco. These products include cigarettes, chewing tobacco, and vaping devices, such as e-cigarettes. If you need help quitting, ask your health care provider. ?Do not use street drugs. ?Do not share needles. ?Ask your health care provider for help if you need support or information about quitting drugs. ?General instructions ?Schedule regular health, dental, and eye exams. ?Stay current with your vaccines. ?Tell your health care provider if: ?You often feel depressed. ?You have ever been abused  or do not feel safe at home. ?Summary ?Adopting a healthy lifestyle and getting preventive care are important in promoting health and wellness. ?Follow your health care provider's instructions about healthy diet, exercising, and getting tested or screened for diseases. ?Follow your health care provider's instructions on monitoring your cholesterol and blood pressure. ?This information is not intended to replace advice given to you by your health care provider. Make sure you discuss any questions you have with your health care provider. ?Document Revised: 11/22/2020 Document Reviewed: 11/22/2020 ?Elsevier Patient Education ? [place]. ? ?

## 2021-10-31 ENCOUNTER — Ambulatory Visit (INDEPENDENT_AMBULATORY_CARE_PROVIDER_SITE_OTHER): Payer: 59 | Admitting: Internal Medicine

## 2021-10-31 VITALS — BP 118/60 | HR 106 | Temp 98.2°F | Ht 69.0 in | Wt 167.0 lb

## 2021-10-31 DIAGNOSIS — K760 Fatty (change of) liver, not elsewhere classified: Secondary | ICD-10-CM

## 2021-10-31 DIAGNOSIS — F419 Anxiety disorder, unspecified: Secondary | ICD-10-CM

## 2021-10-31 DIAGNOSIS — E7849 Other hyperlipidemia: Secondary | ICD-10-CM | POA: Diagnosis not present

## 2021-10-31 DIAGNOSIS — E1151 Type 2 diabetes mellitus with diabetic peripheral angiopathy without gangrene: Secondary | ICD-10-CM | POA: Diagnosis not present

## 2021-10-31 DIAGNOSIS — D696 Thrombocytopenia, unspecified: Secondary | ICD-10-CM

## 2021-10-31 DIAGNOSIS — Z0000 Encounter for general adult medical examination without abnormal findings: Secondary | ICD-10-CM | POA: Diagnosis not present

## 2021-10-31 DIAGNOSIS — I10 Essential (primary) hypertension: Secondary | ICD-10-CM

## 2021-10-31 DIAGNOSIS — Z13820 Encounter for screening for osteoporosis: Secondary | ICD-10-CM

## 2021-10-31 DIAGNOSIS — R0989 Other specified symptoms and signs involving the circulatory and respiratory systems: Secondary | ICD-10-CM

## 2021-10-31 DIAGNOSIS — G479 Sleep disorder, unspecified: Secondary | ICD-10-CM

## 2021-10-31 LAB — MICROALBUMIN / CREATININE URINE RATIO
Creatinine,U: 201.4 mg/dL
Microalb Creat Ratio: 11.7 mg/g (ref 0.0–30.0)
Microalb, Ur: 23.6 mg/dL — ABNORMAL HIGH (ref 0.0–1.9)

## 2021-10-31 LAB — CBC WITH DIFFERENTIAL/PLATELET
Basophils Absolute: 0 10*3/uL (ref 0.0–0.1)
Basophils Relative: 0.5 % (ref 0.0–3.0)
Eosinophils Absolute: 0.3 10*3/uL (ref 0.0–0.7)
Eosinophils Relative: 2.9 % (ref 0.0–5.0)
HCT: 34 % — ABNORMAL LOW (ref 36.0–46.0)
Hemoglobin: 11.7 g/dL — ABNORMAL LOW (ref 12.0–15.0)
Lymphocytes Relative: 22.5 % (ref 12.0–46.0)
Lymphs Abs: 2 10*3/uL (ref 0.7–4.0)
MCHC: 34.3 g/dL (ref 30.0–36.0)
MCV: 94.6 fl (ref 78.0–100.0)
Monocytes Absolute: 0.8 10*3/uL (ref 0.1–1.0)
Monocytes Relative: 9 % (ref 3.0–12.0)
Neutro Abs: 5.7 10*3/uL (ref 1.4–7.7)
Neutrophils Relative %: 65.1 % (ref 43.0–77.0)
Platelets: 90 10*3/uL — ABNORMAL LOW (ref 150.0–400.0)
RBC: 3.59 Mil/uL — ABNORMAL LOW (ref 3.87–5.11)
RDW: 13 % (ref 11.5–15.5)
WBC: 8.7 10*3/uL (ref 4.0–10.5)

## 2021-10-31 LAB — LIPID PANEL
Cholesterol: 154 mg/dL (ref 0–200)
HDL: 43.4 mg/dL (ref >39.00)
LDL Cholesterol: 85 mg/dL (ref 0–99)
NonHDL: 110.25
Total CHOL/HDL Ratio: 4
Triglycerides: 124 mg/dL (ref 0.0–149.0)
VLDL: 24.8 mg/dL (ref 0.0–40.0)

## 2021-10-31 LAB — COMPREHENSIVE METABOLIC PANEL
ALT: 33 U/L (ref 0–35)
AST: 51 U/L — ABNORMAL HIGH (ref 0–37)
Albumin: 4.3 g/dL (ref 3.5–5.2)
Alkaline Phosphatase: 95 U/L (ref 39–117)
BUN: 31 mg/dL — ABNORMAL HIGH (ref 6–23)
CO2: 25 mEq/L (ref 19–32)
Calcium: 9.7 mg/dL (ref 8.4–10.5)
Chloride: 105 mEq/L (ref 96–112)
Creatinine, Ser: 2.47 mg/dL — ABNORMAL HIGH (ref 0.40–1.20)
GFR: 19.93 mL/min — ABNORMAL LOW (ref >60.00)
Glucose, Bld: 110 mg/dL — ABNORMAL HIGH (ref 70–99)
Potassium: 3.4 mEq/L — ABNORMAL LOW (ref 3.5–5.1)
Sodium: 141 mEq/L (ref 135–145)
Total Bilirubin: 1.2 mg/dL (ref 0.2–1.2)
Total Protein: 7.5 g/dL (ref 6.0–8.3)

## 2021-10-31 LAB — TSH: TSH: 1.48 u[IU]/mL (ref 0.35–5.50)

## 2021-10-31 MED ORDER — BENAZEPRIL HCL 10 MG PO TABS: 10.0000 mg | ORAL_TABLET | Freq: Every day | ORAL | 3 refills | Status: DC

## 2021-10-31 MED ORDER — METOPROLOL SUCCINATE ER 25 MG PO TB24: 25.0000 mg | ORAL_TABLET | Freq: Every day | ORAL | 3 refills | Status: DC

## 2021-10-31 MED ORDER — ASPIRIN 81 MG PO TBEC: 81.0000 mg | DELAYED_RELEASE_TABLET | Freq: Every day | ORAL | 12 refills | Status: AC

## 2021-10-31 MED ORDER — METOPROLOL TARTRATE 50 MG PO TABS: 25.0000 mg | ORAL_TABLET | Freq: Every day | ORAL | 2 refills | Status: DC

## 2021-10-31 NOTE — Assessment & Plan Note (Signed)
Chronic ?Related to fatty liver disease ?CBC ?

## 2021-10-31 NOTE — Addendum Note (Signed)
Addended by: Binnie Rail on: 10/31/2021 04:39 PM ? ? Modules accepted: Orders ? ?

## 2021-10-31 NOTE — Assessment & Plan Note (Signed)
Chronic ?CMP today ?Has changed diet, lifestyle and has successfully lost weight ?Continue weight loss efforts ?

## 2021-10-31 NOTE — Assessment & Plan Note (Signed)
Chronic ?Denies depression ?Has some life anxieties ?At this point she does not feel like she needs any medication ?Continue regular exercise ?Has good social support ?Continue to make time for herself ?

## 2021-10-31 NOTE — Assessment & Plan Note (Signed)
Chronic ?Regular exercise and healthy diet encouraged ?Check lipid panel  ?Continue Crestor 40 mg daily ?

## 2021-10-31 NOTE — Assessment & Plan Note (Signed)
Chronic ?Management per Dr. Cruzita Lederer ?On Trulicity, glipizide, metformin ?Check urine microalbumin ?

## 2021-10-31 NOTE — Assessment & Plan Note (Addendum)
Chronic ?Blood pressure well controlled, but often lower at home and she is experiencing orthostatic lightheadedness likely related to hypotension so her blood pressure is overcontrolled ?CMP ?Decrease benazepril to 10 mg daily (currently taking 20 mg daily) ?Discontinue hydrochlorothiazide ?Change metoprolol to ER 25 mg once daily (currently taking 25 mg Lopressor once daily ?Lightheadedness should resolve ?She will monitor BP at home ?

## 2021-11-04 ENCOUNTER — Ambulatory Visit (HOSPITAL_COMMUNITY)
Admission: RE | Admit: 2021-11-04 | Discharge: 2021-11-04 | Disposition: A | Discharge status: Home / Self Care | Payer: 59 | Source: Ambulatory Visit | Attending: Internal Medicine | Admitting: Internal Medicine

## 2021-11-04 DIAGNOSIS — R0989 Other specified symptoms and signs involving the circulatory and respiratory systems: Secondary | ICD-10-CM | POA: Diagnosis present

## 2021-11-06 ENCOUNTER — Encounter: Payer: Self-pay | Admitting: Internal Medicine

## 2021-11-06 DIAGNOSIS — I6529 Occlusion and stenosis of unspecified carotid artery: Secondary | ICD-10-CM | POA: Insufficient documentation

## 2021-11-07 MED ORDER — METOPROLOL SUCCINATE ER 25 MG PO TB24: 12.5000 mg | ORAL_TABLET | Freq: Every day | ORAL | 3 refills | Status: DC

## 2021-11-11 ENCOUNTER — Other Ambulatory Visit: Payer: 59 | Admitting: Internal Medicine

## 2021-11-11 ENCOUNTER — Other Ambulatory Visit (INDEPENDENT_AMBULATORY_CARE_PROVIDER_SITE_OTHER): Payer: 59

## 2021-11-11 DIAGNOSIS — I10 Essential (primary) hypertension: Secondary | ICD-10-CM

## 2021-11-11 DIAGNOSIS — E1151 Type 2 diabetes mellitus with diabetic peripheral angiopathy without gangrene: Secondary | ICD-10-CM | POA: Diagnosis not present

## 2021-11-11 LAB — BASIC METABOLIC PANEL
BUN: 22 mg/dL (ref 6–23)
CO2: 24 mEq/L (ref 19–32)
Calcium: 9.1 mg/dL (ref 8.4–10.5)
Chloride: 109 mEq/L (ref 96–112)
Creatinine, Ser: 1.49 mg/dL — ABNORMAL HIGH (ref 0.40–1.20)
GFR: 36.54 mL/min — ABNORMAL LOW (ref >60.00)
Glucose, Bld: 82 mg/dL (ref 70–99)
Potassium: 3.6 mEq/L (ref 3.5–5.1)
Sodium: 141 mEq/L (ref 135–145)

## 2021-11-12 ENCOUNTER — Other Ambulatory Visit: Payer: Self-pay | Admitting: Internal Medicine

## 2021-11-13 ENCOUNTER — Encounter: Payer: Self-pay | Admitting: Internal Medicine

## 2021-11-14 ENCOUNTER — Other Ambulatory Visit: Payer: Self-pay | Admitting: Internal Medicine

## 2021-11-14 DIAGNOSIS — I10 Essential (primary) hypertension: Secondary | ICD-10-CM

## 2021-11-21 ENCOUNTER — Encounter: Payer: Self-pay | Admitting: Internal Medicine

## 2021-11-21 DIAGNOSIS — I10 Essential (primary) hypertension: Secondary | ICD-10-CM

## 2021-11-22 MED ORDER — METOPROLOL SUCCINATE ER 25 MG PO TB24: 12.5000 mg | ORAL_TABLET | Freq: Every day | ORAL | 3 refills | Status: DC

## 2021-11-22 MED ORDER — METOPROLOL SUCCINATE ER 25 MG PO TB24: 25.0000 mg | ORAL_TABLET | Freq: Every day | ORAL | 3 refills | Status: DC

## 2021-11-23 NOTE — Addendum Note (Signed)
Addended by: Binnie Rail on: 11/23/2021 07:20 AM ? ? Modules accepted: Orders ? ?

## 2021-11-24 ENCOUNTER — Encounter: Payer: Self-pay | Admitting: Internal Medicine

## 2021-11-24 DIAGNOSIS — B9689 Other specified bacterial agents as the cause of diseases classified elsewhere: Secondary | ICD-10-CM | POA: Insufficient documentation

## 2021-11-24 DIAGNOSIS — N760 Acute vaginitis: Secondary | ICD-10-CM | POA: Insufficient documentation

## 2021-11-24 DIAGNOSIS — N183 Chronic kidney disease, stage 3 unspecified: Secondary | ICD-10-CM | POA: Insufficient documentation

## 2021-11-24 DIAGNOSIS — N1832 Chronic kidney disease, stage 3b: Secondary | ICD-10-CM | POA: Insufficient documentation

## 2021-11-24 NOTE — Progress Notes (Signed)
? ? ? ? ?Subjective:  ? ? Patient ID: [name], female    DOB: 02-Jan-1956, 66 y.o.   MRN: [id number] ? ? ? ? ?HPI ?[name] is here for follow up of her chronic medical problems, including htn, dec GFR ? ?BP meds stopped due to low BP and decreased GFR.  ? ?BP starting to run high off all medications -- BP 140/77 at times, often it is good.  It fluctuates.  No palpitations.  ? ?Swelling in legs is worse, but not new - progresses as day goes on.  Left ankle / foot is worse than right.  ? ?Fasting sugars 80-90 fasting. Usually under 130 after eating. ? ?She is trying to increase her water intake, but is not always good at it. ? ? ?Medications and allergies reviewed with patient and updated if appropriate. ? ?Current Outpatient Medications on File Prior to Visit  ?Medication Sig Dispense Refill  ? aspirin 81 MG EC tablet Take 1 tablet (81 mg total) by mouth daily. Swallow whole. 30 tablet 12  ? ezetimibe (ZETIA) 10 MG tablet TAKE ONE TABLET BY MOUTH DAILY 60 tablet 2  ? glucose blood (ONETOUCH VERIO) test strip Use to check blood sugar 1 time per day. Dx Code E11.9 100 each 12  ? metFORMIN (GLUCOPHAGE) 1000 MG tablet Take 1 tablet (1,000 mg total) by mouth 2 (two) times daily. 180 tablet 3  ? rosuvastatin (CRESTOR) 40 MG tablet Take 1 tablet (40 mg total) by mouth daily. 90 tablet 3  ? TRULICITY 3 BH/4.1PF SOPN INJECT 3 MG INTO THE SKIN ONCE A WEEK. 6 mL 3  ? ?No current facility-administered medications on file prior to visit.  ? ? ? ?Review of Systems  ?HENT:  Positive for sinus pressure (improved once she gets up and moves around).   ?Respiratory:  Negative for shortness of breath.   ?Cardiovascular:  Positive for leg swelling. Negative for chest pain and palpitations.  ?Neurological:  Negative for dizziness, light-headedness and headaches.  ? ?   ?Objective:  ? ?Vitals:  ? 11/25/21 0921  ?BP: 130/72  ?Pulse: 80  ?Temp: 98.6 ?F (37 ?C)  ?SpO2: 99%  ? ?BP Readings from Last 3 Encounters:  ?11/25/21 130/72   ?10/31/21 118/60  ?08/10/21 118/60  ? ?Wt Readings from Last 3 Encounters:  ?11/25/21 177 lb (80.3 kg)  ?10/31/21 167 lb (75.8 kg)  ?08/10/21 181 lb (82.1 kg)  ? ?Body mass index is 26.14 kg/m?. ? ?  ?Physical Exam ?Constitutional:   ?   Appearance: Normal appearance.  ?HENT:  ?   Head: Normocephalic.  ?Cardiovascular:  ?   Rate and Rhythm: Normal rate and regular rhythm.  ?Pulmonary:  ?   Effort: Pulmonary effort is normal. No respiratory distress.  ?   Breath sounds: Normal breath sounds. No wheezing or rales.  ?Musculoskeletal:  ?   Right lower leg: Edema (trace) present.  ?   Left lower leg: Edema (mild ankle/foot) present.  ?Neurological:  ?   Mental Status: She is alert.  ? ?   ? ?Lab Results  ?Component Value Date  ? WBC 8.7 10/31/2021  ? HGB 11.7 (L) 10/31/2021  ? HCT 34.0 (L) 10/31/2021  ? PLT 90.0 (L) 10/31/2021  ? GLUCOSE 82 11/11/2021  ? CHOL 154 10/31/2021  ? TRIG 124.0 10/31/2021  ? HDL 43.40 10/31/2021  ? LDLDIRECT 149.9 02/14/2010  ? [place] 85 10/31/2021  ? ALT 33 10/31/2021  ? AST 51 (H) 10/31/2021  ? NA 141  11/11/2021  ? K 3.6 11/11/2021  ? CL 109 11/11/2021  ? CREATININE 1.49 (H) 11/11/2021  ? BUN 22 11/11/2021  ? CO2 24 11/11/2021  ? TSH 1.48 10/31/2021  ? INR 1.2 (H) 08/10/2021  ? HGBA1C 5.8 (A) 06/30/2021  ? MICROALBUR 23.6 (H) 10/31/2021  ? ? ? ?Assessment & Plan:  ? ? ?See Problem List for Assessment and Plan of chronic medical problems.  ? ? ?

## 2021-11-25 ENCOUNTER — Ambulatory Visit: Payer: 59 | Admitting: Internal Medicine

## 2021-11-25 ENCOUNTER — Other Ambulatory Visit (INDEPENDENT_AMBULATORY_CARE_PROVIDER_SITE_OTHER): Payer: 59

## 2021-11-25 VITALS — BP 130/72 | HR 80 | Temp 98.6°F | Ht 69.0 in | Wt 177.0 lb

## 2021-11-25 DIAGNOSIS — I10 Essential (primary) hypertension: Secondary | ICD-10-CM

## 2021-11-25 DIAGNOSIS — N1832 Chronic kidney disease, stage 3b: Secondary | ICD-10-CM

## 2021-11-25 DIAGNOSIS — E1151 Type 2 diabetes mellitus with diabetic peripheral angiopathy without gangrene: Secondary | ICD-10-CM

## 2021-11-25 LAB — BASIC METABOLIC PANEL
BUN: 16 mg/dL (ref 6–23)
CO2: 25 mEq/L (ref 19–32)
Calcium: 9.5 mg/dL (ref 8.4–10.5)
Chloride: 109 mEq/L (ref 96–112)
Creatinine, Ser: 1.3 mg/dL — ABNORMAL HIGH (ref 0.40–1.20)
GFR: 43.02 mL/min — ABNORMAL LOW (ref >60.00)
Glucose, Bld: 89 mg/dL (ref 70–99)
Potassium: 4 mEq/L (ref 3.5–5.1)
Sodium: 144 mEq/L (ref 135–145)

## 2021-11-25 MED ORDER — METFORMIN HCL 1000 MG PO TABS: 1000.0000 mg | ORAL_TABLET | Freq: Every day | ORAL | 3 refills | Status: DC

## 2021-11-25 MED ORDER — METOPROLOL SUCCINATE ER 25 MG PO TB24: 12.5000 mg | ORAL_TABLET | Freq: Every day | ORAL | 3 refills | Status: DC

## 2021-11-25 NOTE — Patient Instructions (Addendum)
? ? ? ? ?  Medications changes include :   start metoprolol xl 12.5 mg daily.  Decrease metformin to once a day ? ? ?Your prescription(s) have been sent to your pharmacy.  ? ? ?A referral was ordered for Nephrology - kidney doctor.     Someone from that office will call you to schedule an appointment.  ? ? ?Return for follow up as scheduled. ? ?

## 2021-11-25 NOTE — Assessment & Plan Note (Signed)
Chronic ?BP not ideally controlled off medication - BP goes above goal of SBP< 130 ?Restart metoprolol xl 12.5 mg daily ?Bmp pending ? ?

## 2021-11-25 NOTE — Assessment & Plan Note (Signed)
Chronic ?Sugars well controlled ?Management per Dr. Cruzita Lederer ?I will go ahead and decrease her metformin 1000 mg twice daily to 1000 mg with breakfast since her sugars are so well controlled and her kidney function has worsened ?She will continue to monitor her sugars closely ?

## 2021-11-25 NOTE — Assessment & Plan Note (Addendum)
Chronic-GFR has been decreased, but drastically got worse recently-?  Related to low BP, dehydration ?CKD Likely related to htn, DM - now both well controlled ?BP slightly elevated at times without medication-restart metoprolol 12.5 mg daily ?Stressed increase water ?No NSAIDs ?Will refer to nephrology ?

## 2021-12-01 ENCOUNTER — Inpatient Hospital Stay: Payer: 59 | Attending: Oncology | Admitting: Oncology

## 2021-12-01 ENCOUNTER — Encounter: Payer: Self-pay | Admitting: Oncology

## 2021-12-01 ENCOUNTER — Inpatient Hospital Stay: Payer: 59

## 2021-12-01 VITALS — BP 162/86 | HR 98 | Temp 98.2°F | Resp 18 | Ht 69.0 in | Wt 180.0 lb

## 2021-12-01 DIAGNOSIS — R768 Other specified abnormal immunological findings in serum: Secondary | ICD-10-CM | POA: Insufficient documentation

## 2021-12-01 DIAGNOSIS — E119 Type 2 diabetes mellitus without complications: Secondary | ICD-10-CM | POA: Diagnosis not present

## 2021-12-01 DIAGNOSIS — D649 Anemia, unspecified: Secondary | ICD-10-CM | POA: Diagnosis present

## 2021-12-01 DIAGNOSIS — I10 Essential (primary) hypertension: Secondary | ICD-10-CM | POA: Insufficient documentation

## 2021-12-01 DIAGNOSIS — D696 Thrombocytopenia, unspecified: Secondary | ICD-10-CM | POA: Insufficient documentation

## 2021-12-01 LAB — CBC WITH DIFFERENTIAL (CANCER CENTER ONLY)
Abs Immature Granulocytes: 0.01 10*3/uL (ref 0.00–0.07)
Basophils Absolute: 0 10*3/uL (ref 0.0–0.1)
Basophils Relative: 1 %
Eosinophils Absolute: 0.3 10*3/uL (ref 0.0–0.5)
Eosinophils Relative: 5 %
HCT: 36.5 % (ref 36.0–46.0)
Hemoglobin: 11.5 g/dL — ABNORMAL LOW (ref 12.0–15.0)
Immature Granulocytes: 0 %
Lymphocytes Relative: 26 %
Lymphs Abs: 1.6 10*3/uL (ref 0.7–4.0)
MCH: 30.3 pg (ref 26.0–34.0)
MCHC: 31.5 g/dL (ref 30.0–36.0)
MCV: 96.1 fL (ref 80.0–100.0)
Monocytes Absolute: 0.6 10*3/uL (ref 0.1–1.0)
Monocytes Relative: 9 %
Neutro Abs: 3.7 10*3/uL (ref 1.7–7.7)
Neutrophils Relative %: 59 %
Platelet Count: 116 10*3/uL — ABNORMAL LOW (ref 150–400)
RBC: 3.8 MIL/uL — ABNORMAL LOW (ref 3.87–5.11)
RDW: 13.1 % (ref 11.5–15.5)
WBC Count: 6.3 10*3/uL (ref 4.0–10.5)
nRBC: 0 % (ref 0.0–0.2)

## 2021-12-01 NOTE — Progress Notes (Signed)
  [facility] OFFICE PROGRESS NOTE   Diagnosis: Thrombocytopenia  INTERVAL HISTORY:   [name] returns as scheduled.  She feels well.  She reports intentional weight loss with diet and exercise.  No bleeding.  No fever or night sweats.  She has noted increased leg swelling since stopping HCTZ.   She was evaluated by Dr. Silverio Decamp in January.  A myeloma panel here in November revealed mild elevation of the serum kappa and lambda light chains with a serum immunofixation revealing a polyclonal increase in 1 or more immunoglobulins.  The IgA was mildly elevated.    Objective:  Vital signs in last 24 hours:  Blood pressure (!) 162/86, pulse 98, temperature 98.2 F (36.8 C), temperature source Oral, resp. rate 18, height 5' 9"  (1.753 m), weight 180 lb (81.6 kg), SpO2 99 %.    Lymphatics: No cervical, supraclavicular, axillary, or inguinal nodes Resp: Lungs clear bilaterally Cardio: Regular rate and rhythm GI: No hepatosplenomegaly Vascular: Trace lower leg edema bilaterally   Lab Results:  Lab Results  Component Value Date   WBC 6.3 12/01/2021   HGB 11.5 (L) 12/01/2021   HCT 36.5 12/01/2021   MCV 96.1 12/01/2021   PLT 116 (L) 12/01/2021   NEUTROABS 3.7 12/01/2021    CMP  Lab Results  Component Value Date   NA 144 11/25/2021   K 4.0 11/25/2021   CL 109 11/25/2021   CO2 25 11/25/2021   GLUCOSE 89 11/25/2021   BUN 16 11/25/2021   CREATININE 1.30 (H) 11/25/2021   CALCIUM 9.5 11/25/2021   PROT 7.5 10/31/2021   ALBUMIN 4.3 10/31/2021   AST 51 (H) 10/31/2021   ALT 33 10/31/2021   ALKPHOS 95 10/31/2021   BILITOT 1.2 10/31/2021   GFRNONAA 70.20 02/14/2010   GFRAA 85 04/22/2008    Medications: I have reviewed the patient's current medications.   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  The thrombocytopenia appears chronic.  She has mild normocytic anemia.  The hematology findings may be related to a systemic inflammatory condition.  We will check a repeat erythrocyte sedimentation rate and ANA when she returns in 3 months.  I have a low clinical suspicion for a metabolic malignancy.  The anemia could be in part related to renal insufficiency.  She will call for bleeding or bruising.  She will return for an office and lab visit in 3 months.  Betsy Coder, MD  12/01/2021  3:22 PM

## 2021-12-14 ENCOUNTER — Encounter: Payer: Self-pay | Admitting: Internal Medicine

## 2021-12-16 MED ORDER — METOPROLOL SUCCINATE ER 25 MG PO TB24: 25.0000 mg | ORAL_TABLET | Freq: Every day | ORAL | 3 refills | Status: DC

## 2021-12-24 ENCOUNTER — Other Ambulatory Visit: Payer: Self-pay | Admitting: Internal Medicine

## 2021-12-27 ENCOUNTER — Encounter: Payer: Self-pay | Admitting: Internal Medicine

## 2021-12-27 DIAGNOSIS — I10 Essential (primary) hypertension: Secondary | ICD-10-CM

## 2021-12-28 ENCOUNTER — Encounter: Payer: Self-pay | Admitting: Internal Medicine

## 2021-12-28 MED ORDER — HYDROCHLOROTHIAZIDE 12.5 MG PO TABS: 12.5000 mg | ORAL_TABLET | Freq: Every day | ORAL | 3 refills | Status: DC

## 2022-01-03 ENCOUNTER — Telehealth: Payer: Self-pay | Admitting: Pharmacy Technician

## 2022-01-03 ENCOUNTER — Other Ambulatory Visit (HOSPITAL_COMMUNITY): Payer: Self-pay

## 2022-01-03 NOTE — Telephone Encounter (Signed)
Patient Advocate Encounter   Received notification from CoverMyMeds (unable to process for this plan) that prior authorization for Trulicity 18m is required by his/her insurance [employer]/ [employer]. MJearld Shines [id number]  PA submitted on 01/03/22 - through Prompt PA Prior Auth (EOC) ID: [id number] is pending    [facility]:  Patient Advocate Fax:  [phone]

## 2022-01-05 ENCOUNTER — Other Ambulatory Visit (HOSPITAL_COMMUNITY): Payer: Self-pay

## 2022-01-05 DIAGNOSIS — B3731 Acute candidiasis of vulva and vagina: Secondary | ICD-10-CM | POA: Insufficient documentation

## 2022-01-05 DIAGNOSIS — N764 Abscess of vulva: Secondary | ICD-10-CM | POA: Insufficient documentation

## 2022-01-05 NOTE — Telephone Encounter (Signed)
Patient Advocate Encounter  Prior Authorization has been approved.    Effective dates: 01/03/22 through 01/03/23  Per Test Claim Patients co-pay is $40.   Spoke with Pharmacy to Process. They will order for tomorrow  Patient Advocate Fax:  [phone]

## 2022-01-06 ENCOUNTER — Other Ambulatory Visit (INDEPENDENT_AMBULATORY_CARE_PROVIDER_SITE_OTHER): Payer: 59

## 2022-01-06 DIAGNOSIS — I10 Essential (primary) hypertension: Secondary | ICD-10-CM | POA: Diagnosis not present

## 2022-01-06 LAB — BASIC METABOLIC PANEL
BUN: 27 mg/dL — ABNORMAL HIGH (ref 6–23)
CO2: 26 mEq/L (ref 19–32)
Calcium: 9.2 mg/dL (ref 8.4–10.5)
Chloride: 100 mEq/L (ref 96–112)
Creatinine, Ser: 1.7 mg/dL — ABNORMAL HIGH (ref 0.40–1.20)
GFR: 31.16 mL/min — ABNORMAL LOW (ref >60.00)
Glucose, Bld: 136 mg/dL — ABNORMAL HIGH (ref 70–99)
Potassium: 3 mEq/L — ABNORMAL LOW (ref 3.5–5.1)
Sodium: 138 mEq/L (ref 135–145)

## 2022-01-09 ENCOUNTER — Other Ambulatory Visit: Payer: 59

## 2022-01-09 ENCOUNTER — Other Ambulatory Visit: Payer: Self-pay | Admitting: Internal Medicine

## 2022-01-09 ENCOUNTER — Encounter: Payer: Self-pay | Admitting: Internal Medicine

## 2022-01-09 MED ORDER — POTASSIUM CHLORIDE CRYS ER 20 MEQ PO TBCR: 20.0000 meq | EXTENDED_RELEASE_TABLET | Freq: Every day | ORAL | 3 refills | Status: DC

## 2022-01-13 ENCOUNTER — Encounter: Payer: Self-pay | Admitting: Internal Medicine

## 2022-01-16 MED ORDER — POTASSIUM CHLORIDE 20 MEQ PO PACK: 20.0000 meq | PACK | Freq: Every day | ORAL | 5 refills | Status: DC

## 2022-01-19 ENCOUNTER — Ambulatory Visit: Payer: 59 | Admitting: Internal Medicine

## 2022-01-19 ENCOUNTER — Encounter: Payer: Self-pay | Admitting: Internal Medicine

## 2022-01-19 VITALS — BP 130/82 | HR 98 | Ht 69.0 in | Wt 156.2 lb

## 2022-01-19 DIAGNOSIS — E7849 Other hyperlipidemia: Secondary | ICD-10-CM | POA: Diagnosis not present

## 2022-01-19 DIAGNOSIS — E1151 Type 2 diabetes mellitus with diabetic peripheral angiopathy without gangrene: Secondary | ICD-10-CM | POA: Diagnosis not present

## 2022-01-19 DIAGNOSIS — E669 Obesity, unspecified: Secondary | ICD-10-CM | POA: Diagnosis not present

## 2022-01-19 LAB — POCT GLYCOSYLATED HEMOGLOBIN (HGB A1C): Hemoglobin A1C: 5.2 % (ref 4.0–5.6)

## 2022-01-19 MED ORDER — TRULICITY 1.5 MG/0.5ML ~~LOC~~ SOAJ: 1.5000 mg | SUBCUTANEOUS | 3 refills | Status: DC

## 2022-01-19 NOTE — Progress Notes (Signed)
Patient ID: [name], female   DOB: [birth year], 66 y.o.   MRN: [id number]  HPI: [name] is a pleasant 66 y.o.-year-old female, presenting for follow-up for DM2, dx in 2000, non-insulin-dependent, uncontrolled, with complications (PVD, CKD, DR).  Last visit 6 months ago.  Interim history: No increased urination, blurry vision, nausea, chest pain. She lost 17 lbs before last OV >> eliminated starches, sweets, snacks.  Since then, she lost 28 more pounds. She continues to adjust her diet (reading labels, eliminated processed foods) and exercise: Rowing and walking.  She reduced the HCTZ, Metoprolol dose since last OV.  She just finished ABx for BV. She had an abscess lanced >> developed fever of 103F. Was on ABx.  Reviewed HbA1c levels: Lab Results  Component Value Date   HGBA1C 5.8 (A) 06/30/2021   HGBA1C 6.3 (A) 05/20/2020   HGBA1C 6.1 (A) 11/13/2019  01/13/2021: HbA1c 6.1%  Pt is on a regimen of: - Metformin 1000 mg with b'fast - Glipizide 5 mg before dinner >> 2.5 mg before a larger dinner >> 5 >> 2.5-5 mg before dinner >> 2.5 mg before a larger dinner - Trulicity 1.5 >> 3 mg weekly She was on Farxiga 10 mg daily in am >> stopped 08/2017 after mixup at the pharmacy. She was on [employer] stopped covering it. She was on Kombiglyze = Metformin + Onglyza (Saxagliptin) - 1000-2.5 mg daily >> rash.  Pt checks her sugars 0-1x a day: - am: 81-100 >> 74-121, 132 >> 61, 65, 74-133 >> 89-122, 132 - 2h after b'fast: 130, 218 >> n/c >> 130 >> n/c - before lunch: n/c >> 138 >> n/c - 2h after lunch:  170-175 >> n/c >> 180 >> 150 >> n/c - before dinner:  120-128 >> n/c >> 118 >> n/c - 2h after dinner: n/c >> 214>> n/c - bedtime: n/c - nighttime: n/c Lowest sugar was: 74 >> 61 >> 89; she has hypoglycemia awareness in the 80. Highest sugar was: 214 >> 133 >> 132.  Glucometer: One Touch Ultra  -+ Mild CKD, last BUN/creatinine:  Lab Results  Component Value Date   BUN  27 (H) 01/06/2022   CREATININE 1.70 (H) 01/06/2022   Lab Results  Component Value Date   GFRAA 85 04/22/2008   GFRAA 85 11/22/2006   Lab Results  Component Value Date   MICRALBCREAT 11.7 10/31/2021   MICRALBCREAT 0.8 08/26/2020   MICRALBCREAT 0.7 06/07/2015   MICRALBCREAT 0.8 02/03/2015   MICRALBCREAT 0.5 06/05/2014   MICRALBCREAT 0.4 04/05/2012   MICRALBCREAT 0.6 11/01/2010   MICRALBCREAT 0.4 02/14/2010   MICRALBCREAT 5.1 04/22/2008   MICRALBCREAT 3.0 11/22/2006  On benazepril.  -+ HL. Last set of lipids: Lab Results  Component Value Date   CHOL 154 10/31/2021   HDL 43.40 10/31/2021   LDLCALC 85 10/31/2021   LDLDIRECT 149.9 02/14/2010   TRIG 124.0 10/31/2021   CHOLHDL 4 10/31/2021  On Crestor 40 and Zetia 10.  - last eye exam was in 07/2020: No DR reportedly; she had hemorrhage in the left eye in the past.  -+ Numbness and tingling in her feet developed after spine surgery  She also has HTN, Had back surgery 2007. Back pain increased 02/2015 >> saw Dr Tamala Julian >> started Gabapentin >> disequilibrium. She was on steroids then >> sugars higher. In 03/2015: spine steroid inj.  ROS: + see HPI Neurological: no tremors/+ numbness/+ tingling/no dizziness  I reviewed pt's medications, allergies, PMH, social hx, family hx, and  changes were documented in the history of present illness. Otherwise, unchanged from my initial visit note.  Past Medical History:  Diagnosis Date   Allergy    Anxiety    Arthritis    Colitis    in her 8s; quiescent since   DM (diabetes mellitus) ([place])    HTN (hypertension)    Hyperlipidemia    Past Surgical History:  Procedure Laterality Date   CERVIX SURGERY  2001   Cryosurgery, Dr Connye Burkitt   COLONOSCOPY  2009   Negative, Dr. Delfin Edis   FOOT SURGERY     bone spurs resected, Dr Aida Puffer- 2019   KNEE ARTHROSCOPY  1980   Right   LAMINECTOMY  2007   X 2, Dr Gladstone Lighter   Social History   Social History   Marital Status: Divorced     Spouse Name: N/A   Number of Children: 1   Occupational History   [employer]   Social History Main Topics   Smoking status: Former Smoker    Quit date: 1996   Alcohol Use: Yes     Comment:  rarely   Drug Use: No   Current Outpatient Medications on File Prior to Visit  Medication Sig Dispense Refill   aspirin 81 MG EC tablet Take 1 tablet (81 mg total) by mouth daily. Swallow whole. 30 tablet 12   ezetimibe (ZETIA) 10 MG tablet TAKE ONE TABLET BY MOUTH DAILY 60 tablet 2   glucose blood (ONETOUCH VERIO) test strip Use to check blood sugar 1 time per day. Dx Code E11.9 100 each 12   hydrochlorothiazide (HYDRODIURIL) 12.5 MG tablet Take 1 tablet (12.5 mg total) by mouth daily. 90 tablet 3   metFORMIN (GLUCOPHAGE) 1000 MG tablet Take 1 tablet (1,000 mg total) by mouth daily with breakfast. 90 tablet 3   metoprolol succinate (TOPROL-XL) 25 MG 24 hr tablet Take 1 tablet (25 mg total) by mouth daily. 90 tablet 3   potassium chloride (KLOR-CON) 20 MEQ packet Take 20 mEq by mouth daily. 30 each 5   rosuvastatin (CRESTOR) 40 MG tablet TAKE ONE TABLET BY MOUTH DAILY 90 tablet 3   TRULICITY 3 AJ/6.8TL SOPN INJECT 3 MG INTO THE SKIN ONCE A WEEK. 6 mL 3   No current facility-administered medications on file prior to visit.   Allergies  Allergen Reactions   Atorvastatin     REACTION: ELEVATES LFT'S. Pt states she is currently taking Atorvastatin 40 mg (1/2 tablet daily) and does not recall hab=ving any issues with it. (07/14/14)   Tramadol Nausea And Vomiting   Codeine Nausea Only        Neomycin-Bacitracin Zn-Polymyx Rash   Onglyza [Saxagliptin Hydrochloride] Rash    This occurred after starting Kombiglyze . This was present as a rash over the hands and feet dorsally. She has been able to take metformin in the past as well as Janumet without problems.   Sulfamethoxazole-Trimethoprim Nausea And Vomiting     Nausea was extreme.     Family History  Problem Relation Age  of Onset   Diabetes Father    Hypertension Father    Heart attack Father 24       precipitated by bleeding ulcer   Diabetes Mother    Hypertension Mother    Coronary artery disease Mother    Heart attack Paternal Grandfather        ? 5   Heart attack Maternal Grandfather 68   Diabetes Maternal Grandmother    Stroke  Maternal Grandmother 23   Cancer Neg Hx    Colon cancer Neg Hx    Esophageal cancer Neg Hx    Rectal cancer Neg Hx    Stomach cancer Neg Hx    PE: BP 130/82 (BP Location: Right Arm, Patient Position: Sitting, Cuff Size: Normal)   Pulse 98   Ht 5' 9"  (1.753 m)   Wt 156 lb 3.2 oz (70.9 kg)   SpO2 98%   BMI 23.07 kg/m    Wt Readings from Last 3 Encounters:  01/19/22 156 lb 3.2 oz (70.9 kg)  12/01/21 180 lb (81.6 kg)  11/25/21 177 lb (80.3 kg)   Constitutional: normal weight, in NAD Eyes: PERRLA, EOMI, no exophthalmos ENT: moist mucous membranes, no thyromegaly, no cervical lymphadenopathy Cardiovascular: tachycardia, RR, No MRG Respiratory: CTA B Musculoskeletal: no deformities, strength intact in all 4 Skin: moist, warm, no rashes Neurological: no tremor with outstretched hands, DTR normal in all 4 Diabetic Foot Exam - Simple   Simple Foot Form Diabetic Foot exam was performed with the following findings: Yes 01/19/2022  9:48 AM  Visual Inspection No deformities, no ulcerations, no other skin breakdown bilaterally: Yes Sensation Testing Intact to touch and monofilament testing bilaterally: Yes Pulse Check Posterior Tibialis and Dorsalis pulse intact bilaterally: Yes Comments    ASSESSMENT: 1. DM2, non-insulin-dependent, uncontrolled, with complications - PVD - CKD - DR  2. HL  3. H/o Obesity class 1 - now with normal weight  PLAN:  1. Patient with longstanding, previously uncontrolled type 2 diabetes, with significant improvement in control after she started to improve her diet and be compliant with her diabetic medications.  HbA1c at last visit  was excellent, improved, at 5.8%.  At that time, we decreased her glipizide dose (I advised her to only take half a tablet before a larger dinner) as she had some lower blood sugars in the 60s in the morning. We continued Trulicity and metformin.  A PA for Trulicity was recently approved by her insurance.  I explained that Trulicity will also help with NASH.  I advised her to continue staying off starches, sweets, snacks.  She is also exercising daily-walking, rowing. -Since last visit, PCP decreased her metformin due to decreased kidney function -At today's visit, sugars are excellent.  She only checks in the morning, though.  Based on the low HbA1c, they remained stable throughout the day.  Also, based on the low HbA1c and a significant weight loss, we can reduce the Trulicity dose. - I suggested to:  Patient Instructions  Please continue: - Metformin 1000 mg with b'fast - Glipizide 2.5 mg before a larger dinner  Please decrease: - Trulicity 1.5 mg weekly  Please return in 6 months with your sugar log.  - we checked her HbA1c: 5.2% (lower) - advised to check sugars at different times of the day - 1x a day, rotating check times - advised for yearly eye exams >> she is not UTD - foot exam performed today - return to clinic in 6 months  2. HL -Reviewed latest lipid panel from 10/2021: LDL above our goal of less than 55, while the rest of the lipid fractions are at goal. Lab Results  Component Value Date   CHOL 154 10/31/2021   HDL 43.40 10/31/2021   LDLCALC 85 10/31/2021   LDLDIRECT 149.9 02/14/2010   TRIG 124.0 10/31/2021   CHOLHDL 4 10/31/2021  -On Crestor 40 mg daily and Zetia 10 mg daily  3. H/o Obesity class 1 -We  will continue Trulicity which should also help with weight loss -however, we will decrease the dose.  We were also able to reduce her glipizide dose at that time.  Issues with further help with weight loss -she did not use it since last visit -Before last visit, she  lost 17 lbs!  She lost 28 more pounds since then!  Philemon Kingdom, MD PhD [facility]

## 2022-01-19 NOTE — Patient Instructions (Addendum)
Please continue: - Metformin 1000 mg with b'fast - Glipizide 2.5 mg before a larger dinner  Please decrease: - Trulicity 1.5 mg weekly  Please return in 6 months with your sugar log.

## 2022-01-27 ENCOUNTER — Encounter: Payer: Self-pay | Admitting: Internal Medicine

## 2022-02-12 ENCOUNTER — Other Ambulatory Visit: Payer: Self-pay | Admitting: Internal Medicine

## 2022-02-20 LAB — HM MAMMOGRAPHY

## 2022-02-21 ENCOUNTER — Encounter: Payer: Self-pay | Admitting: Internal Medicine

## 2022-02-27 DIAGNOSIS — R350 Frequency of micturition: Secondary | ICD-10-CM | POA: Insufficient documentation

## 2022-03-03 ENCOUNTER — Inpatient Hospital Stay: Payer: 59 | Admitting: Nurse Practitioner

## 2022-03-03 ENCOUNTER — Encounter: Payer: Self-pay | Admitting: Nurse Practitioner

## 2022-03-03 ENCOUNTER — Inpatient Hospital Stay: Payer: 59 | Attending: Oncology

## 2022-03-03 VITALS — BP 139/74 | HR 85 | Temp 98.2°F | Resp 18 | Ht 69.0 in | Wt 163.2 lb

## 2022-03-03 DIAGNOSIS — D649 Anemia, unspecified: Secondary | ICD-10-CM | POA: Diagnosis present

## 2022-03-03 DIAGNOSIS — E119 Type 2 diabetes mellitus without complications: Secondary | ICD-10-CM | POA: Insufficient documentation

## 2022-03-03 DIAGNOSIS — I10 Essential (primary) hypertension: Secondary | ICD-10-CM | POA: Diagnosis not present

## 2022-03-03 DIAGNOSIS — N289 Disorder of kidney and ureter, unspecified: Secondary | ICD-10-CM | POA: Diagnosis not present

## 2022-03-03 DIAGNOSIS — D696 Thrombocytopenia, unspecified: Secondary | ICD-10-CM | POA: Diagnosis present

## 2022-03-03 LAB — CBC WITH DIFFERENTIAL (CANCER CENTER ONLY)
Abs Immature Granulocytes: 0 10*3/uL (ref 0.00–0.07)
Basophils Absolute: 0 10*3/uL (ref 0.0–0.1)
Basophils Relative: 0 %
Eosinophils Absolute: 0.3 10*3/uL (ref 0.0–0.5)
Eosinophils Relative: 7 %
HCT: 37 % (ref 36.0–46.0)
Hemoglobin: 12 g/dL (ref 12.0–15.0)
Immature Granulocytes: 0 %
Lymphocytes Relative: 32 %
Lymphs Abs: 1.5 10*3/uL (ref 0.7–4.0)
MCH: 29.6 pg (ref 26.0–34.0)
MCHC: 32.4 g/dL (ref 30.0–36.0)
MCV: 91.1 fL (ref 80.0–100.0)
Monocytes Absolute: 0.4 10*3/uL (ref 0.1–1.0)
Monocytes Relative: 9 %
Neutro Abs: 2.5 10*3/uL (ref 1.7–7.7)
Neutrophils Relative %: 52 %
Platelet Count: 88 10*3/uL — ABNORMAL LOW (ref 150–400)
RBC: 4.06 MIL/uL (ref 3.87–5.11)
RDW: 14 % (ref 11.5–15.5)
WBC Count: 4.7 10*3/uL (ref 4.0–10.5)
nRBC: 0 % (ref 0.0–0.2)

## 2022-03-03 LAB — SEDIMENTATION RATE: Sed Rate: 32 mm/hr — ABNORMAL HIGH (ref 0–22)

## 2022-03-03 NOTE — Progress Notes (Signed)
  [place] OFFICE PROGRESS NOTE   Diagnosis: Thrombocytopenia  INTERVAL HISTORY:   Ms. Gulick returns as scheduled.  He feels well.  She has a good appetite.  She reports weight is stable.  No bleeding.  No skin rash.  No inflamed joints.  Objective:  Vital signs in last 24 hours:  Blood pressure 139/74, pulse 85, temperature 98.2 F (36.8 C), temperature source Oral, resp. rate 18, height 5' 9"  (1.753 m), weight 163 lb 3.2 oz (74 kg), SpO2 100 %.    Lymphatics: No palpable cervical, supraclavicular or axillary lymph nodes. Resp: Lungs clear bilaterally. Cardio: Regular rate and rhythm. GI: No hepatosplenomegaly. Vascular: Trace bilateral pedal edema left slightly greater than right. Skin: No rash.   Lab Results:  Lab Results  Component Value Date   WBC 4.7 03/03/2022   HGB 12.0 03/03/2022   HCT 37.0 03/03/2022   MCV 91.1 03/03/2022   PLT 88 (L) 03/03/2022   NEUTROABS 2.5 03/03/2022    Imaging:  No results found.  Medications: I have reviewed the patient's current medications.  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 Smithey remains stable from a hematologic standpoint.  We follow-up on the sed rate and ANA from today.  She will contact the office with bruising/bleeding.  She will return for lab and follow-up in 3 months.    Ned Card ANP/GNP-BC   03/03/2022  11:30 AM

## 2022-03-06 LAB — ANTINUCLEAR ANTIBODIES, IFA: ANA Ab, IFA: NEGATIVE

## 2022-03-09 ENCOUNTER — Encounter: Payer: Self-pay | Admitting: Internal Medicine

## 2022-03-09 NOTE — Progress Notes (Signed)
Outside notes received. Information abstracted. Notes sent to scan.  

## 2022-03-22 DIAGNOSIS — N6320 Unspecified lump in the left breast, unspecified quadrant: Secondary | ICD-10-CM | POA: Insufficient documentation

## 2022-03-23 ENCOUNTER — Other Ambulatory Visit: Payer: Self-pay | Admitting: Obstetrics and Gynecology

## 2022-03-23 DIAGNOSIS — N6320 Unspecified lump in the left breast, unspecified quadrant: Secondary | ICD-10-CM

## 2022-03-28 ENCOUNTER — Other Ambulatory Visit: Payer: Self-pay | Admitting: Internal Medicine

## 2022-04-04 ENCOUNTER — Encounter: Payer: Self-pay | Admitting: Internal Medicine

## 2022-04-04 DIAGNOSIS — E1151 Type 2 diabetes mellitus with diabetic peripheral angiopathy without gangrene: Secondary | ICD-10-CM

## 2022-04-04 MED ORDER — TRULICITY 1.5 MG/0.5ML ~~LOC~~ SOAJ: 1.5000 mg | SUBCUTANEOUS | 3 refills | Status: DC

## 2022-04-10 ENCOUNTER — Ambulatory Visit
Admission: RE | Admit: 2022-04-10 | Discharge: 2022-04-10 | Disposition: A | Discharge status: Home / Self Care | Payer: 59 | Source: Ambulatory Visit | Attending: Obstetrics and Gynecology | Admitting: Obstetrics and Gynecology

## 2022-04-10 ENCOUNTER — Other Ambulatory Visit: Payer: Self-pay | Admitting: Obstetrics and Gynecology

## 2022-04-10 DIAGNOSIS — N6320 Unspecified lump in the left breast, unspecified quadrant: Secondary | ICD-10-CM

## 2022-04-17 ENCOUNTER — Encounter: Payer: Self-pay | Admitting: Internal Medicine

## 2022-04-18 ENCOUNTER — Other Ambulatory Visit: Payer: Self-pay

## 2022-04-18 ENCOUNTER — Encounter: Payer: Self-pay | Admitting: Internal Medicine

## 2022-04-18 DIAGNOSIS — E1151 Type 2 diabetes mellitus with diabetic peripheral angiopathy without gangrene: Secondary | ICD-10-CM

## 2022-04-18 MED ORDER — TRULICITY 1.5 MG/0.5ML ~~LOC~~ SOAJ: 1.5000 mg | SUBCUTANEOUS | 2 refills | Status: DC

## 2022-05-04 ENCOUNTER — Encounter: Payer: Self-pay | Admitting: Internal Medicine

## 2022-05-04 NOTE — Progress Notes (Signed)
Subjective:    Patient ID: [name], female    DOB: Jan 17, 1956, 66 y.o.   MRN: [id number]     HPI [name] is here for follow up of her chronic medical problems, including htn, ckd, DM, thrombocytopenia  Thrombocytopenia - seeing hem/onc - stable will f/u with them in 3 months.  Up to date with nephrology.  Doing well.     Medications and allergies reviewed with patient and updated if appropriate.  Current Outpatient Medications on File Prior to Visit  Medication Sig Dispense Refill   aspirin 81 MG EC tablet Take 1 tablet (81 mg total) by mouth daily. Swallow whole. 30 tablet 12   Dulaglutide (TRULICITY) 1.5 LS/9.3TD SOPN Inject 1.5 mg into the skin once a week. 6 mL 2   ezetimibe (ZETIA) 10 MG tablet TAKE ONE TABLET BY MOUTH DAILY 60 tablet 2   glucose blood (ONETOUCH VERIO) test strip Use to check blood sugar 1 time per day. Dx Code E11.9 100 each 12   hydrochlorothiazide (HYDRODIURIL) 12.5 MG tablet Take 1 tablet (12.5 mg total) by mouth daily. 90 tablet 3   metFORMIN (GLUCOPHAGE) 1000 MG tablet TAKE ONE TABLET BY MOUTH TWICE A DAY (Patient taking differently: Take 1,000 mg by mouth daily.) 60 tablet 3   metoprolol succinate (TOPROL-XL) 25 MG 24 hr tablet Take 1 tablet (25 mg total) by mouth daily. (Patient taking differently: Take 12.5 mg by mouth daily.) 90 tablet 3   potassium chloride (KLOR-CON) 20 MEQ packet Take 20 mEq by mouth daily. 30 each 5   rosuvastatin (CRESTOR) 40 MG tablet TAKE ONE TABLET BY MOUTH DAILY 90 tablet 3   No current facility-administered medications on file prior to visit.     Review of Systems  Constitutional:  Negative for chills and fever.  Respiratory:  Negative for cough, shortness of breath and wheezing.   Cardiovascular:  Negative for chest pain, palpitations and leg swelling.  Neurological:  Negative for light-headedness and headaches.       Objective:   Vitals:   05/05/22 1026  BP: 120/76  Pulse: 87  Temp: 97.6 F  (36.4 C)  SpO2: 96%   BP Readings from Last 3 Encounters:  05/05/22 120/76  03/03/22 139/74  01/19/22 130/82   Wt Readings from Last 3 Encounters:  05/05/22 165 lb (74.8 kg)  03/03/22 163 lb 3.2 oz (74 kg)  01/19/22 156 lb 3.2 oz (70.9 kg)   Body mass index is 24.37 kg/m.    Physical Exam Constitutional:      General: She is not in acute distress.    Appearance: Normal appearance.  HENT:     Head: Normocephalic and atraumatic.  Eyes:     Conjunctiva/sclera: Conjunctivae normal.  Cardiovascular:     Rate and Rhythm: Normal rate and regular rhythm.     Heart sounds: Normal heart sounds. No murmur heard. Pulmonary:     Effort: Pulmonary effort is normal. No respiratory distress.     Breath sounds: Normal breath sounds. No wheezing.  Musculoskeletal:     Cervical back: Neck supple.     Right lower leg: No edema.     Left lower leg: No edema.  Lymphadenopathy:     Cervical: No cervical adenopathy.  Skin:    General: Skin is warm and dry.     Findings: No rash.  Neurological:     Mental Status: She is alert. Mental status is at baseline.  Psychiatric:  Mood and Affect: Mood normal.        Behavior: Behavior normal.        Lab Results  Component Value Date   WBC 4.7 03/03/2022   HGB 12.0 03/03/2022   HCT 37.0 03/03/2022   PLT 88 (L) 03/03/2022   GLUCOSE 136 (H) 01/06/2022   CHOL 154 10/31/2021   TRIG 124.0 10/31/2021   HDL 43.40 10/31/2021   LDLDIRECT 149.9 02/14/2010   LDLCALC 85 10/31/2021   ALT 33 10/31/2021   AST 51 (H) 10/31/2021   NA 138 01/06/2022   K 3.0 (L) 01/06/2022   CL 100 01/06/2022   CREATININE 1.70 (H) 01/06/2022   BUN 27 (H) 01/06/2022   CO2 26 01/06/2022   TSH 1.48 10/31/2021   INR 1.2 (H) 08/10/2021   HGBA1C 5.2 01/19/2022   MICROALBUR 23.6 (H) 10/31/2021     Assessment & Plan:    See Problem List for Assessment and Plan of chronic medical problems.

## 2022-05-04 NOTE — Patient Instructions (Addendum)
       Medications changes include :   none       Return in about 6 months (around 11/04/2022) for Physical Exam.

## 2022-05-05 ENCOUNTER — Encounter: Payer: Self-pay | Admitting: Internal Medicine

## 2022-05-05 ENCOUNTER — Ambulatory Visit: Payer: 59 | Admitting: Internal Medicine

## 2022-05-05 VITALS — BP 120/76 | HR 87 | Temp 97.6°F | Ht 69.0 in | Wt 165.0 lb

## 2022-05-05 DIAGNOSIS — D696 Thrombocytopenia, unspecified: Secondary | ICD-10-CM

## 2022-05-05 DIAGNOSIS — E7849 Other hyperlipidemia: Secondary | ICD-10-CM

## 2022-05-05 DIAGNOSIS — N1832 Chronic kidney disease, stage 3b: Secondary | ICD-10-CM | POA: Diagnosis not present

## 2022-05-05 DIAGNOSIS — I10 Essential (primary) hypertension: Secondary | ICD-10-CM

## 2022-05-05 DIAGNOSIS — E1151 Type 2 diabetes mellitus with diabetic peripheral angiopathy without gangrene: Secondary | ICD-10-CM | POA: Diagnosis not present

## 2022-05-05 NOTE — Assessment & Plan Note (Signed)
Chronic Regular exercise and healthy diet encouraged Check lipid panel  Continue Zetia 10 mg daily, rosuvastatin 40 mg daily

## 2022-05-05 NOTE — Assessment & Plan Note (Signed)
Chronic Stable Being monitored by hematology/oncology

## 2022-05-05 NOTE — Assessment & Plan Note (Signed)
Chronic Management per Dr. Cruzita Lederer Currently on metformin 1000 mg daily and Trulicity 1.5 mg weekly

## 2022-05-05 NOTE — Assessment & Plan Note (Signed)
Chronic Following with nephrology

## 2022-05-05 NOTE — Assessment & Plan Note (Signed)
Chronic Blood pressure well controlled CMP Continue HCTZ 12.5 mg daily, metoprolol XL 25 mg daily

## 2022-05-08 ENCOUNTER — Other Ambulatory Visit: Payer: Self-pay | Admitting: Internal Medicine

## 2022-05-09 ENCOUNTER — Encounter: Payer: Self-pay | Admitting: Internal Medicine

## 2022-05-24 ENCOUNTER — Other Ambulatory Visit: Payer: Self-pay | Admitting: Internal Medicine

## 2022-06-02 ENCOUNTER — Inpatient Hospital Stay: Payer: 59 | Admitting: Oncology

## 2022-06-02 ENCOUNTER — Inpatient Hospital Stay: Payer: 59

## 2022-06-13 ENCOUNTER — Other Ambulatory Visit: Payer: Self-pay

## 2022-06-13 ENCOUNTER — Encounter: Payer: Self-pay | Admitting: Internal Medicine

## 2022-06-13 MED ORDER — EZETIMIBE 10 MG PO TABS: 10.0000 mg | ORAL_TABLET | Freq: Every day | ORAL | 0 refills | Status: DC

## 2022-06-14 ENCOUNTER — Encounter: Payer: Self-pay | Admitting: Internal Medicine

## 2022-06-30 ENCOUNTER — Encounter: Payer: Self-pay | Admitting: Internal Medicine

## 2022-07-12 ENCOUNTER — Ambulatory Visit
Admission: RE | Admit: 2022-07-12 | Discharge: 2022-07-12 | Disposition: A | Discharge status: Home / Self Care | Payer: PPO | Source: Ambulatory Visit | Attending: Obstetrics and Gynecology | Admitting: Obstetrics and Gynecology

## 2022-07-12 ENCOUNTER — Ambulatory Visit: Payer: 59

## 2022-07-12 DIAGNOSIS — N6320 Unspecified lump in the left breast, unspecified quadrant: Secondary | ICD-10-CM

## 2022-07-24 ENCOUNTER — Encounter: Payer: Self-pay | Admitting: Internal Medicine

## 2022-07-24 ENCOUNTER — Ambulatory Visit (INDEPENDENT_AMBULATORY_CARE_PROVIDER_SITE_OTHER): Payer: PPO | Admitting: Internal Medicine

## 2022-07-24 VITALS — BP 128/72 | HR 90 | Ht 69.0 in | Wt 168.6 lb

## 2022-07-24 DIAGNOSIS — E7849 Other hyperlipidemia: Secondary | ICD-10-CM | POA: Diagnosis not present

## 2022-07-24 DIAGNOSIS — E1151 Type 2 diabetes mellitus with diabetic peripheral angiopathy without gangrene: Secondary | ICD-10-CM

## 2022-07-24 DIAGNOSIS — E669 Obesity, unspecified: Secondary | ICD-10-CM

## 2022-07-24 LAB — POCT GLYCOSYLATED HEMOGLOBIN (HGB A1C): Hemoglobin A1C: 5.7 % — AB (ref 4.0–5.6)

## 2022-07-24 MED ORDER — METFORMIN HCL 1000 MG PO TABS: 1000.0000 mg | ORAL_TABLET | Freq: Every day | ORAL | 3 refills | Status: DC

## 2022-07-24 NOTE — Patient Instructions (Addendum)
Please continue: - Metformin 1000 mg with b'fast - Trulicity 1.5 mg weekly  Please return in 6 months with your sugar log.

## 2022-07-24 NOTE — Progress Notes (Signed)
Patient ID: [name], female   DOB: 04-21-1956, 67 y.o.   MRN: [id number]  HPI: [name] is a pleasant 67 y.o.-year-old female, presenting for follow-up for DM2, dx in 2000, non-insulin-dependent, uncontrolled, with complications (PVD, CKD, DR).  Last visit 6 months ago.  Interim history: No increased urination, blurry vision, nausea, chest pain.  Before the last 2 visits, she lost a total of 45 pounds.  Since last visit, she gained 12 pounds. She had dietary indiscretions in the last 2-3 mo. She sees hematology for thrombocytopenia. She retired from her job since last OV. Now has a part time job with [employer] - will start soon. She had a UTI in the last 6 mo >> went to UC. She ended up having BV >> treated now.  Reviewed HbA1c levels: Lab Results  Component Value Date   HGBA1C 5.2 01/19/2022   HGBA1C 5.8 (A) 06/30/2021   HGBA1C 6.3 (A) 05/20/2020  01/13/2021: HbA1c 6.1%  Pt is on a regimen of: - Metformin 1000 mg with b'fast  - not using - Trulicity 1.5 >> 3 >> 1.5 mg weekly She was on Farxiga 10 mg daily in am >> stopped 08/2017 after mixup at the pharmacy.  She was on [employer] stopped covering it. She was on Kombiglyze = Metformin + Onglyza (Saxagliptin) - 1000-2.5 mg daily >> rash.  Pt checks her sugars 0-1x a day: - am: 61, 65, 74-133 >> 89-122, 132 >> 100-117, 140 - 2h after b'fast: 130, 218 >> n/c >> 130 >> 138 - before lunch: n/c >> 138 >> n/c >> 76, 112 - 2h after lunch: 170-175 >> n/c >> 180 >> 150 >> n/c - before dinner: 120-128 >> n/c >> 118 >> n/c >> 90 - 2h after dinner: n/c >> 214 >> n/c - bedtime: n/c - nighttime: n/c Lowest sugar was: 74 >> 61 >> 89 >> 60 (after cereals + banana, followed by increased activity); she has  hypoglycemia awareness in the 80. Highest sugar was: 214 >> 133 >> 132 >> 140  Glucometer: One Touch Ultra  -+ Mild CKD, last BUN/creatinine:  Lab Results  Component Value Date   BUN 27 (H) 01/06/2022   CREATININE 1.70  (H) 01/06/2022   Lab Results  Component Value Date   GFRAA 85 04/22/2008   GFRAA 85 11/22/2006   Lab Results  Component Value Date   MICRALBCREAT 11.7 10/31/2021   MICRALBCREAT 0.8 08/26/2020   MICRALBCREAT 0.7 06/07/2015   MICRALBCREAT 0.8 02/03/2015   MICRALBCREAT 0.5 06/05/2014   MICRALBCREAT 0.4 04/05/2012   MICRALBCREAT 0.6 11/01/2010   MICRALBCREAT 0.4 02/14/2010   MICRALBCREAT 5.1 04/22/2008   MICRALBCREAT 3.0 11/22/2006  On benazepril.  -+ HL. Last set of lipids: Lab Results  Component Value Date   CHOL 154 10/31/2021   HDL 43.40 10/31/2021   LDLCALC 85 10/31/2021   LDLDIRECT 149.9 02/14/2010   TRIG 124.0 10/31/2021   CHOLHDL 4 10/31/2021  On Crestor 40 and Zetia 10.  - last eye exam was in 07/2020: No DR reportedly; she had hemorrhage in the left eye in the past.  -+ Numbness and tingling in her feet developed after spine surgery.  Last foot exam 01/19/2022.  She also has HTN, Had back surgery 2007. Back pain increased 02/2015 >> saw Dr Tamala Julian >> started Gabapentin >> disequilibrium. She was on steroids then >> sugars higher. In 03/2015: spine steroid inj.  ROS: + see HPI  I reviewed pt's medications, allergies, PMH, social hx,  family hx, and changes were documented in the history of present illness. Otherwise, unchanged from my initial visit note.  Past Medical History:  Diagnosis Date   Allergy    Anxiety    Arthritis    Colitis    in her 80s; quiescent since   DM (diabetes mellitus) ([place])    HTN (hypertension)    Hyperlipidemia    Past Surgical History:  Procedure Laterality Date   CERVIX SURGERY  2001   Cryosurgery, Dr Connye Burkitt   COLONOSCOPY  2009   Negative, Dr. Delfin Edis   FOOT SURGERY     bone spurs resected, Dr Aida Puffer- 2019   KNEE ARTHROSCOPY  1980   Right   LAMINECTOMY  2007   X 2, Dr Gladstone Lighter   Social History   Social History   Marital Status: Divorced    Spouse Name: N/A   Number of Children: 1   Occupational History    [employer]   Social History Main Topics   Smoking status: Former Smoker    Quit date: 1996   Alcohol Use: Yes     Comment:  rarely   Drug Use: No   Current Outpatient Medications on File Prior to Visit  Medication Sig Dispense Refill   aspirin 81 MG EC tablet Take 1 tablet (81 mg total) by mouth daily. Swallow whole. 30 tablet 12   Dulaglutide (TRULICITY) 1.5 EP/3.2RJ SOPN Inject 1.5 mg into the skin once a week. 6 mL 2   ezetimibe (ZETIA) 10 MG tablet Take 1 tablet (10 mg total) by mouth daily. 90 tablet 0   glucose blood (ONETOUCH VERIO) test strip Use to check blood sugar 1 time per day. Dx Code E11.9 100 each 12   hydrochlorothiazide (HYDRODIURIL) 12.5 MG tablet Take 1 tablet (12.5 mg total) by mouth daily. 90 tablet 3   KLOR-CON M20 20 MEQ tablet TAKE 1 TABLET BY MOUTH DAILY 30 tablet 3   metFORMIN (GLUCOPHAGE) 1000 MG tablet TAKE ONE TABLET BY MOUTH TWICE A DAY (Patient taking differently: Take 1,000 mg by mouth daily.) 60 tablet 3   metoprolol succinate (TOPROL-XL) 25 MG 24 hr tablet Take 1 tablet (25 mg total) by mouth daily. (Patient taking differently: Take 12.5 mg by mouth daily.) 90 tablet 3   potassium chloride (KLOR-CON) 20 MEQ packet Take 20 mEq by mouth daily. 30 each 5   rosuvastatin (CRESTOR) 40 MG tablet TAKE ONE TABLET BY MOUTH DAILY 90 tablet 3   No current facility-administered medications on file prior to visit.   Allergies  Allergen Reactions   Atorvastatin     REACTION: ELEVATES LFT'S. Pt states she is currently taking Atorvastatin 40 mg (1/2 tablet daily) and does not recall hab=ving any issues with it. (07/14/14)   Tramadol Nausea And Vomiting   Codeine Nausea Only        Neomycin-Bacitracin Zn-Polymyx Rash   Onglyza [Saxagliptin Hydrochloride] Rash    This occurred after starting Kombiglyze . This was present as a rash over the hands and feet dorsally. She has been able to take metformin in the past as well as Janumet without  problems.   Sulfamethoxazole-Trimethoprim Nausea And Vomiting     Nausea was extreme.     Family History  Problem Relation Age of Onset   Diabetes Father    Hypertension Father    Heart attack Father 1       precipitated by bleeding ulcer   Diabetes Mother    Hypertension Mother  Coronary artery disease Mother    Heart attack Paternal Grandfather        ? 10   Heart attack Maternal Grandfather 68   Diabetes Maternal Grandmother    Stroke Maternal Grandmother 82   Cancer Neg Hx    Colon cancer Neg Hx    Esophageal cancer Neg Hx    Rectal cancer Neg Hx    Stomach cancer Neg Hx    PE: BP 128/72 (BP Location: Right Arm, Patient Position: Sitting)   Pulse 90   Ht '5\' 9"'$  (1.753 m)   Wt 168 lb 9.6 oz (76.5 kg)   SpO2 99%   BMI 24.90 kg/m    Wt Readings from Last 3 Encounters:  07/24/22 168 lb 9.6 oz (76.5 kg)  05/05/22 165 lb (74.8 kg)  03/03/22 163 lb 3.2 oz (74 kg)   Constitutional: Normal weight, in NAD Eyes:  EOMI, no exophthalmos ENT: no neck masses, no cervical lymphadenopathy Cardiovascular: RRR, No MRG Respiratory: CTA B Musculoskeletal: no deformities Skin:no rashes Neurological: no tremor with outstretched hands  ASSESSMENT: 1. DM2, non-insulin-dependent, uncontrolled, with complications - PVD - CKD - DR  2. HL  3. H/o Obesity class 1 - now with normal weight  PLAN:  1. Patient with standing, previously uncontrolled type 2 diabetes, with significant improvement in control after she started to improve her diet and be compliant with her diabetic medications.  HbA1c at last visit was excellent, in the normal range, at 5.2%.  At that time, I advised her to reduce the Trulicity dose to 1.5 mg weekly.  We continued metformin and a very low-dose of glipizide as needed before larger meals (not using it now).  Of note, before last visit, PCP decreased her metformin dose due to decreased kidney function.  At last visit, sugars were excellent but she was only  checking in the morning.  I advised her to also occasionally check later in the day. -At today's visit, despite having had more dietary indiscretions after retirement and with the holidays, sugars are at goal and HbA1c is still excellent, although higher.  We do not need to change her regimen now. - I suggested to:  Patient Instructions  Please continue: - Metformin 1000 mg with b'fast - Trulicity 1.5 mg weekly  Please return in 6 months with your sugar log.  - we checked her HbA1c: 5.7% (higher) - advised to check sugars at different times of the day - 1x a day, rotating check times - advised for yearly eye exams >> she is not UTD - return to clinic in 6 months  2. HL -Reviewed latest lipid panel from 10/2021: LDL above our target of  <55 due to history of cardiovascular disease, but otherwise fractions at goal Lab Results  Component Value Date   CHOL 154 10/31/2021   HDL 43.40 10/31/2021   LDLCALC 85 10/31/2021   LDLDIRECT 149.9 02/14/2010   TRIG 124.0 10/31/2021   CHOLHDL 4 10/31/2021  -She continues on Crestor 40 mg daily and Zetia 10 mg daily without side effects  3. H/o Obesity class 1 -She continues on Trulicity for weight loss.  We reduced the dose at last visit due to significant improvement in blood sugars and weight. -She last 45 pounds before the last 2 visits combined, of which 28 before last visit! -She gained 12 pounds since then  Philemon Kingdom, MD PhD [facility] Endocrinology

## 2022-08-02 ENCOUNTER — Inpatient Hospital Stay: Payer: PPO | Attending: Nurse Practitioner

## 2022-08-02 ENCOUNTER — Inpatient Hospital Stay: Payer: PPO | Admitting: Oncology

## 2022-08-02 ENCOUNTER — Other Ambulatory Visit: Payer: Self-pay | Admitting: Internal Medicine

## 2022-08-02 VITALS — BP 157/74 | HR 86 | Temp 98.1°F | Resp 16 | Wt 167.0 lb

## 2022-08-02 DIAGNOSIS — I10 Essential (primary) hypertension: Secondary | ICD-10-CM | POA: Insufficient documentation

## 2022-08-02 DIAGNOSIS — K760 Fatty (change of) liver, not elsewhere classified: Secondary | ICD-10-CM | POA: Diagnosis not present

## 2022-08-02 DIAGNOSIS — R700 Elevated erythrocyte sedimentation rate: Secondary | ICD-10-CM | POA: Insufficient documentation

## 2022-08-02 DIAGNOSIS — E1151 Type 2 diabetes mellitus with diabetic peripheral angiopathy without gangrene: Secondary | ICD-10-CM

## 2022-08-02 DIAGNOSIS — D649 Anemia, unspecified: Secondary | ICD-10-CM | POA: Insufficient documentation

## 2022-08-02 DIAGNOSIS — D696 Thrombocytopenia, unspecified: Secondary | ICD-10-CM

## 2022-08-02 DIAGNOSIS — N289 Disorder of kidney and ureter, unspecified: Secondary | ICD-10-CM | POA: Diagnosis not present

## 2022-08-02 DIAGNOSIS — E119 Type 2 diabetes mellitus without complications: Secondary | ICD-10-CM | POA: Insufficient documentation

## 2022-08-02 LAB — CBC WITH DIFFERENTIAL (CANCER CENTER ONLY)
Abs Immature Granulocytes: 0.01 10*3/uL (ref 0.00–0.07)
Basophils Absolute: 0 10*3/uL (ref 0.0–0.1)
Basophils Relative: 0 %
Eosinophils Absolute: 0.3 10*3/uL (ref 0.0–0.5)
Eosinophils Relative: 6 %
HCT: 37.5 % (ref 36.0–46.0)
Hemoglobin: 11.9 g/dL — ABNORMAL LOW (ref 12.0–15.0)
Immature Granulocytes: 0 %
Lymphocytes Relative: 32 %
Lymphs Abs: 1.5 10*3/uL (ref 0.7–4.0)
MCH: 29.3 pg (ref 26.0–34.0)
MCHC: 31.7 g/dL (ref 30.0–36.0)
MCV: 92.4 fL (ref 80.0–100.0)
Monocytes Absolute: 0.5 10*3/uL (ref 0.1–1.0)
Monocytes Relative: 10 %
Neutro Abs: 2.4 10*3/uL (ref 1.7–7.7)
Neutrophils Relative %: 52 %
Platelet Count: 90 10*3/uL — ABNORMAL LOW (ref 150–400)
RBC: 4.06 MIL/uL (ref 3.87–5.11)
RDW: 13.4 % (ref 11.5–15.5)
WBC Count: 4.6 10*3/uL (ref 4.0–10.5)
nRBC: 0 % (ref 0.0–0.2)

## 2022-08-02 NOTE — Progress Notes (Signed)
  [facility] OFFICE PROGRESS NOTE   Diagnosis: Anemia, thrombocytopenia  INTERVAL HISTORY:   [name] returns as scheduled.  She feels well.  She reports a good appetite.  No fever or night sweats.  Intermittent arthralgias.   Objective:  Vital signs in last 24 hours:  Blood pressure (!) 157/74, pulse 86, temperature 98.1 F (36.7 C), temperature source Temporal, resp. rate 16, weight 167 lb (75.8 kg), SpO2 100 %.    Lymphatics: No cervical, supraclavicular, axillary, or inguinal nodes Resp: Lungs clear bilaterally Cardio: Regular rate and rhythm GI: No hepatosplenomegaly Vascular: No leg edema Musculoskeletal: Hand joints wrist without erythema or edema Skin: No rash  Lab Results:  Lab Results  Component Value Date   WBC 4.6 08/02/2022   HGB 11.9 (L) 08/02/2022   HCT 37.5 08/02/2022   MCV 92.4 08/02/2022   PLT 90 (L) 08/02/2022   NEUTROABS 2.4 08/02/2022    CMP  Lab Results  Component Value Date   NA 138 01/06/2022   K 3.0 (L) 01/06/2022   CL 100 01/06/2022   CO2 26 01/06/2022   GLUCOSE 136 (H) 01/06/2022   BUN 27 (H) 01/06/2022   CREATININE 1.70 (H) 01/06/2022   CALCIUM 9.2 01/06/2022   PROT 7.5 10/31/2021   ALBUMIN 4.3 10/31/2021   AST 51 (H) 10/31/2021   ALT 33 10/31/2021   ALKPHOS 95 10/31/2021   BILITOT 1.2 10/31/2021   GFRNONAA 70.20 02/14/2010   GFRAA 85 04/22/2008     Medications: I have reviewed the patient's current medications.   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 has stable mild anemia and thrombocytopenia.  There is evidence of a systemic inflammatory condition with a mildly elevated erythrocyte sedimentation rate and polyclonal hypergammaglobulinemia.  The  ANA was negative.  The mild anemia may be in part due to chronic renal insufficiency.  There is no evidence of a hematocrit of malignancy.  The plan is to continue observation.  She will return for an office and lab visit in 6 months.  She will call for spontaneous bleeding or bruising.  Betsy Coder, MD  08/02/2022  12:40 PM

## 2022-08-04 ENCOUNTER — Inpatient Hospital Stay: Payer: PPO | Admitting: Oncology

## 2022-08-04 ENCOUNTER — Inpatient Hospital Stay: Payer: PPO

## 2022-09-11 ENCOUNTER — Other Ambulatory Visit: Payer: Self-pay | Admitting: Internal Medicine

## 2022-09-13 ENCOUNTER — Encounter: Payer: Self-pay | Admitting: Internal Medicine

## 2022-09-13 DIAGNOSIS — E1151 Type 2 diabetes mellitus with diabetic peripheral angiopathy without gangrene: Secondary | ICD-10-CM

## 2022-09-14 MED ORDER — TRULICITY 1.5 MG/0.5ML ~~LOC~~ SOAJ: 1.5000 mg | SUBCUTANEOUS | 5 refills | Status: DC

## 2022-09-15 ENCOUNTER — Other Ambulatory Visit: Payer: Self-pay | Admitting: Internal Medicine

## 2022-09-25 ENCOUNTER — Other Ambulatory Visit (HOSPITAL_COMMUNITY): Payer: Self-pay

## 2022-09-25 ENCOUNTER — Telehealth: Payer: Self-pay | Admitting: Pharmacy Technician

## 2022-09-25 ENCOUNTER — Encounter: Payer: Self-pay | Admitting: Internal Medicine

## 2022-09-25 ENCOUNTER — Other Ambulatory Visit: Payer: Self-pay | Admitting: Internal Medicine

## 2022-09-25 DIAGNOSIS — E1151 Type 2 diabetes mellitus with diabetic peripheral angiopathy without gangrene: Secondary | ICD-10-CM

## 2022-09-25 NOTE — Telephone Encounter (Signed)
Pharmacy Patient Advocate Encounter   Received notification from pt advice msgs/RMA/fax that prior authorization for Trulicity 1.'5mg'$  is required/requested.   PA submitted on 09/25/22 to (ins) [employer] via Fax [phone] [place] or [facility]) confirmation # request ID [id number]

## 2022-09-26 NOTE — Telephone Encounter (Signed)
Inbound call from [facility] calling regarding appeal for Trulicity request. Information needed include Dx code and clinical notes to fax to: [phone].

## 2022-09-27 DIAGNOSIS — N2581 Secondary hyperparathyroidism of renal origin: Secondary | ICD-10-CM | POA: Diagnosis not present

## 2022-09-27 DIAGNOSIS — I129 Hypertensive chronic kidney disease with stage 1 through stage 4 chronic kidney disease, or unspecified chronic kidney disease: Secondary | ICD-10-CM | POA: Diagnosis not present

## 2022-09-27 DIAGNOSIS — N1831 Chronic kidney disease, stage 3a: Secondary | ICD-10-CM | POA: Diagnosis not present

## 2022-09-27 DIAGNOSIS — E1122 Type 2 diabetes mellitus with diabetic chronic kidney disease: Secondary | ICD-10-CM | POA: Diagnosis not present

## 2022-09-27 DIAGNOSIS — D631 Anemia in chronic kidney disease: Secondary | ICD-10-CM | POA: Diagnosis not present

## 2022-09-28 ENCOUNTER — Other Ambulatory Visit (HOSPITAL_COMMUNITY): Payer: Self-pay

## 2022-09-28 NOTE — Telephone Encounter (Signed)
Denial letter for Trulicity received and attached in patients documents.

## 2022-09-29 LAB — LAB REPORT - SCANNED
Albumin, Urine POC: 9.8
Creatinine, POC: 30.7 mg/dL
EGFR: 45
Microalb Creat Ratio: 32

## 2022-09-29 NOTE — Telephone Encounter (Signed)
Plan called and appeal submitted, additional chart notes faxed to [phone] per request.   Appeal #: [phone]

## 2022-09-29 NOTE — Telephone Encounter (Signed)
Requesting clinical notes be faxed to fax to: [phone].

## 2022-10-02 NOTE — Telephone Encounter (Signed)
Notes faxed to [phone]

## 2022-10-09 ENCOUNTER — Ambulatory Visit (INDEPENDENT_AMBULATORY_CARE_PROVIDER_SITE_OTHER): Payer: PPO | Admitting: Internal Medicine

## 2022-10-09 ENCOUNTER — Encounter: Payer: Self-pay | Admitting: Internal Medicine

## 2022-10-09 ENCOUNTER — Other Ambulatory Visit (HOSPITAL_COMMUNITY): Payer: Self-pay

## 2022-10-09 VITALS — BP 124/70 | HR 90 | Temp 98.6°F | Ht 69.0 in | Wt 172.0 lb

## 2022-10-09 DIAGNOSIS — I10 Essential (primary) hypertension: Secondary | ICD-10-CM | POA: Diagnosis not present

## 2022-10-09 DIAGNOSIS — R062 Wheezing: Secondary | ICD-10-CM

## 2022-10-09 DIAGNOSIS — J209 Acute bronchitis, unspecified: Secondary | ICD-10-CM

## 2022-10-09 DIAGNOSIS — R6889 Other general symptoms and signs: Secondary | ICD-10-CM | POA: Diagnosis not present

## 2022-10-09 LAB — POCT INFLUENZA A/B
Influenza A, POC: NEGATIVE
Influenza B, POC: NEGATIVE

## 2022-10-09 LAB — POCT RESPIRATORY SYNCYTIAL VIRUS: RSV Rapid Ag: NEGATIVE

## 2022-10-09 LAB — POC COVID19 BINAXNOW: SARS Coronavirus 2 Ag: NEGATIVE

## 2022-10-09 MED ORDER — DOXYCYCLINE HYCLATE 100 MG PO TABS: 100.0000 mg | ORAL_TABLET | Freq: Two times a day (BID) | ORAL | 0 refills | Status: AC

## 2022-10-09 MED ORDER — ALBUTEROL SULFATE HFA 108 (90 BASE) MCG/ACT IN AERS: 2.0000 | INHALATION_SPRAY | Freq: Four times a day (QID) | RESPIRATORY_TRACT | 0 refills | Status: DC | PRN

## 2022-10-09 NOTE — Assessment & Plan Note (Signed)
Acute Test for COVID, flu and RSV negative Wheezing and expiration Secondary to upper respiratory infection-concern for bacterial cause Start doxycycline 100 mg BID x 10 day Albuterol inhaler as needed Will hold off on steroids given her diabetes which is well-controlled otc cold medications Rest, fluid Call if no improvement

## 2022-10-09 NOTE — Assessment & Plan Note (Addendum)
Chronic Blood pressure well controlled Continue metoprolol XL 25 mg daily,?  On telmisartan as well

## 2022-10-09 NOTE — Telephone Encounter (Signed)
Pharmacy Patient Advocate Encounter  Prior Authorization for Trulicity 1.5mg  has been approved by [employer] (ins).    PA # [id number] Effective dates: 09/29/22 through 09/29/23  Called Pharmacy to process. Had to leave a voice mail.

## 2022-10-09 NOTE — Patient Instructions (Addendum)
        Medications changes include :   doxycycline twice daily x 10 days, albuterol inhaler      Return if symptoms worsen or fail to improve.

## 2022-10-09 NOTE — Progress Notes (Signed)
Subjective:    Patient ID: [name], female    DOB: 09-May-1956, 67 y.o.   MRN: [id number]      HPI [name] is here for  Chief Complaint  Patient presents with   Cough    Ear ache and sore throat started Friday has gotten worse each day have tried otc medication     She is here for an acute visit for cold symptoms.   Her symptoms started 4-5 days ago.  Cough has gotten worse.  She is experiencing nasal congestion, right ear pain, right-sided sore throat, productive cough And wheezing.  She has had some bodyaches, which is not all that new.  She denies shortness of breath  She has tried taking tussin cough syrup - has not helped.    Home covid test negative.     Medications and allergies reviewed with patient and updated if appropriate.  Current Outpatient Medications on File Prior to Visit  Medication Sig Dispense Refill   aspirin 81 MG EC tablet Take 1 tablet (81 mg total) by mouth daily. Swallow whole. 30 tablet 12   Cholecalciferol (VITAMIN D3) 50 MCG (2000 UT) capsule Take 2,000 Units by mouth daily.     Dulaglutide (TRULICITY) 1.5 0000000 SOPN Inject 1.5 mg into the skin once a week. 2 mL 5   estradiol (ESTRACE) 0.1 MG/GM vaginal cream Place 1 Applicatorful vaginally as directed. Takes twice monthly     ezetimibe (ZETIA) 10 MG tablet TAKE 1 TABLET BY MOUTH EVERY DAY 90 tablet 0   glucose blood (ONETOUCH VERIO) test strip Use to check blood sugar 1 time per day. Dx Code E11.9 100 each 12   hydrochlorothiazide (HYDRODIURIL) 12.5 MG tablet Take 1 tablet (12.5 mg total) by mouth daily. 90 tablet 3   KLOR-CON M20 20 MEQ tablet TAKE 1 TABLET BY MOUTH DAILY 30 tablet 3   metFORMIN (GLUCOPHAGE) 1000 MG tablet Take 1 tablet (1,000 mg total) by mouth daily with breakfast. 90 tablet 3   metoprolol succinate (TOPROL-XL) 25 MG 24 hr tablet Take 1 tablet (25 mg total) by mouth daily. (Patient taking differently: Take 12.5 mg by mouth daily.) 90 tablet 3   Probiotic Product  (PROBIOTIC BLEND PO) Take 1 capsule by mouth daily.     rosuvastatin (CRESTOR) 40 MG tablet TAKE ONE TABLET BY MOUTH DAILY 90 tablet 3   No current facility-administered medications on file prior to visit.    Review of Systems  Constitutional:  Negative for fever.  HENT:  Positive for congestion, ear pain (right) and sore throat (right side). Negative for sinus pain.   Respiratory:  Positive for cough (productive - wet cough) and wheezing (at night). Negative for shortness of breath.   Gastrointestinal:  Negative for diarrhea and nausea.  Musculoskeletal:  Positive for myalgias.  Neurological:  Negative for light-headedness and headaches.       Objective:   Vitals:   10/09/22 1603  BP: 124/70  Pulse: 90  Temp: 98.6 F (37 C)  SpO2: 100%   BP Readings from Last 3 Encounters:  10/09/22 124/70  08/02/22 (!) 157/74  07/24/22 128/72   Wt Readings from Last 3 Encounters:  10/09/22 172 lb (78 kg)  08/02/22 167 lb (75.8 kg)  07/24/22 168 lb 9.6 oz (76.5 kg)   Body mass index is 25.4 kg/m.    Physical Exam Constitutional:      General: She is not in acute distress.    Appearance: Normal appearance. She is not  ill-appearing.  HENT:     Head: Normocephalic and atraumatic.     Right Ear: Tympanic membrane, ear canal and external ear normal.     Left Ear: Tympanic membrane, ear canal and external ear normal.     Mouth/Throat:     Mouth: Mucous membranes are moist.     Pharynx: No oropharyngeal exudate or posterior oropharyngeal erythema.  Eyes:     Conjunctiva/sclera: Conjunctivae normal.  Cardiovascular:     Rate and Rhythm: Normal rate and regular rhythm.  Pulmonary:     Effort: Pulmonary effort is normal. No respiratory distress.     Breath sounds: Wheezing present. No rales.  Musculoskeletal:     Cervical back: Neck supple. No tenderness.  Lymphadenopathy:     Cervical: No cervical adenopathy.  Skin:    General: Skin is warm and dry.  Neurological:     Mental  Status: She is alert.        Test for COVID, RSV and flu all negative.    Assessment & Plan:    See Problem List for Assessment and Plan of chronic medical problems.

## 2022-10-09 NOTE — Assessment & Plan Note (Signed)
Acute Test for COVID, flu and RSV negative Concern for bacterial cause With wheezing on exam Start doxycycline 100 mg BID x 10 day Albuterol inhaler as needed otc cold medications Rest, fluid Call if no improvement

## 2022-10-10 ENCOUNTER — Ambulatory Visit: Payer: PPO | Admitting: Nutrition

## 2022-10-16 ENCOUNTER — Ambulatory Visit (INDEPENDENT_AMBULATORY_CARE_PROVIDER_SITE_OTHER): Payer: PPO | Admitting: Podiatry

## 2022-10-16 VITALS — BP 170/77 | HR 104

## 2022-10-16 DIAGNOSIS — E1151 Type 2 diabetes mellitus with diabetic peripheral angiopathy without gangrene: Secondary | ICD-10-CM

## 2022-10-16 DIAGNOSIS — L600 Ingrowing nail: Secondary | ICD-10-CM | POA: Diagnosis not present

## 2022-10-16 DIAGNOSIS — N1831 Chronic kidney disease, stage 3a: Secondary | ICD-10-CM | POA: Diagnosis not present

## 2022-10-16 NOTE — Progress Notes (Addendum)
This patient presents to the office for diabetic foot exam.  This patient says there is pain  in her feet due to ingrowing toenails both great toes..  No history of infection or drainage.  This patient presents to the office for foot exam due to having a history of diabetes.  She has hoistory of Diabetes, CKD and thrombocytopenia.  Vascular  Dorsalis pedis and posterior tibial pulses are palpable  B/L.  Capillary return  WNL.  Temperature gradient is  WNL.  Skin turgor  WNL  Sensorium  Senn Weinstein monofilament wire  WNL. Normal tactile sensation.  Nail Exam  Patient has normal nails with no evidence of bacterial or fungal infection.  Pincer hallux nails  B/L.  Orthopedic  Exam  Muscle tone and muscle strength  WNL.  No limitations of motion feet  B/L.  No crepitus or joint effusion noted.  Foot type is unremarkable and digits show no abnormalities.  Bony prominences are unremarkable.  Skin  No open lesions.  Normal skin texture and turgor.   Diabetes with no complications  Ingrown Nails  Diabetic foot exam was performed.  There is no evidence of vascular or neurologic pathology. Discussed conservative treatment vs. Surgical treatment.   RTC  prn.   Gardiner Barefoot DPM

## 2022-10-18 DIAGNOSIS — H2513 Age-related nuclear cataract, bilateral: Secondary | ICD-10-CM | POA: Diagnosis not present

## 2022-10-18 DIAGNOSIS — E119 Type 2 diabetes mellitus without complications: Secondary | ICD-10-CM | POA: Diagnosis not present

## 2022-10-18 DIAGNOSIS — H25011 Cortical age-related cataract, right eye: Secondary | ICD-10-CM | POA: Diagnosis not present

## 2022-10-18 LAB — HM DIABETES EYE EXAM

## 2022-10-19 ENCOUNTER — Other Ambulatory Visit (HOSPITAL_COMMUNITY): Payer: Self-pay

## 2022-10-23 ENCOUNTER — Encounter: Payer: Self-pay | Admitting: Internal Medicine

## 2022-10-23 DIAGNOSIS — Z01419 Encounter for gynecological examination (general) (routine) without abnormal findings: Secondary | ICD-10-CM | POA: Diagnosis not present

## 2022-10-23 DIAGNOSIS — Z1231 Encounter for screening mammogram for malignant neoplasm of breast: Secondary | ICD-10-CM | POA: Diagnosis not present

## 2022-10-24 MED ORDER — BREZTRI AEROSPHERE 160-9-4.8 MCG/ACT IN AERO: 2.0000 | INHALATION_SPRAY | Freq: Two times a day (BID) | RESPIRATORY_TRACT | 1 refills | Status: DC

## 2022-10-25 NOTE — Telephone Encounter (Signed)
Pt called back so the medication was too expensive and her insurance do not cover it. Pt is inquiring about some samples that would be here in office.  Best call back [phone]

## 2022-10-27 ENCOUNTER — Encounter: Payer: Self-pay | Admitting: Internal Medicine

## 2022-11-03 ENCOUNTER — Encounter: Payer: Self-pay | Admitting: Internal Medicine

## 2022-11-03 DIAGNOSIS — E1151 Type 2 diabetes mellitus with diabetic peripheral angiopathy without gangrene: Secondary | ICD-10-CM

## 2022-11-03 MED ORDER — ONETOUCH VERIO VI STRP: ORAL_STRIP | 12 refills | Status: DC

## 2022-11-05 ENCOUNTER — Encounter: Payer: Self-pay | Admitting: Internal Medicine

## 2022-11-05 NOTE — Patient Instructions (Addendum)
Blood work was ordered.   The lab is on the first floor.    Medications changes include :   none   A bone density test ordered.    Return in about 6 months (around 05/09/2023) for follow up.    Health Maintenance, Female Adopting a healthy lifestyle and getting preventive care are important in promoting health and wellness. Ask your health care provider about: The right schedule for you to have regular tests and exams. Things you can do on your own to prevent diseases and keep yourself healthy. What should I know about diet, weight, and exercise? Eat a healthy diet  Eat a diet that includes plenty of vegetables, fruits, low-fat dairy products, and lean protein. Do not eat a lot of foods that are high in solid fats, added sugars, or sodium. Maintain a healthy weight Body mass index (BMI) is used to identify weight problems. It estimates body fat based on height and weight. Your health care provider can help determine your BMI and help you achieve or maintain a healthy weight. Get regular exercise Get regular exercise. This is one of the most important things you can do for your health. Most adults should: Exercise for at least 150 minutes each week. The exercise should increase your heart rate and make you sweat (moderate-intensity exercise). Do strengthening exercises at least twice a week. This is in addition to the moderate-intensity exercise. Spend less time sitting. Even light physical activity can be beneficial. Watch cholesterol and blood lipids Have your blood tested for lipids and cholesterol at 67 years of age, then have this test every 5 years. Have your cholesterol levels checked more often if: Your lipid or cholesterol levels are high. You are older than 67 years of age. You are at high risk for heart disease. What should I know about cancer screening? Depending on your health history and family history, you may need to have cancer screening at various ages.  This may include screening for: Breast cancer. Cervical cancer. Colorectal cancer. Skin cancer. Lung cancer. What should I know about heart disease, diabetes, and high blood pressure? Blood pressure and heart disease High blood pressure causes heart disease and increases the risk of stroke. This is more likely to develop in people who have high blood pressure readings or are overweight. Have your blood pressure checked: Every 3-5 years if you are 42-70 years of age. Every year if you are 73 years old or older. Diabetes Have regular diabetes screenings. This checks your fasting blood sugar level. Have the screening done: Once every three years after age 58 if you are at a normal weight and have a low risk for diabetes. More often and at a younger age if you are overweight or have a high risk for diabetes. What should I know about preventing infection? Hepatitis B If you have a higher risk for hepatitis B, you should be screened for this virus. Talk with your health care provider to find out if you are at risk for hepatitis B infection. Hepatitis C Testing is recommended for: Everyone born from [birth year] through 1965. Anyone with known risk factors for hepatitis C. Sexually transmitted infections (STIs) Get screened for STIs, including gonorrhea and chlamydia, if: You are sexually active and are younger than 67 years of age. You are older than 67 years of age and your health care provider tells you that you are at risk for this type of infection. Your sexual activity has changed since you  were last screened, and you are at increased risk for chlamydia or gonorrhea. Ask your health care provider if you are at risk. Ask your health care provider about whether you are at high risk for HIV. Your health care provider may recommend a prescription medicine to help prevent HIV infection. If you choose to take medicine to prevent HIV, you should first get tested for HIV. You should then be tested every 3  months for as long as you are taking the medicine. Pregnancy If you are about to stop having your period (premenopausal) and you may become pregnant, seek counseling before you get pregnant. Take 400 to 800 micrograms (mcg) of folic acid every day if you become pregnant. Ask for birth control (contraception) if you want to prevent pregnancy. Osteoporosis and menopause Osteoporosis is a disease in which the bones lose minerals and strength with aging. This can result in bone fractures. If you are 39 years old or older, or if you are at risk for osteoporosis and fractures, ask your health care provider if you should: Be screened for bone loss. Take a calcium or vitamin D supplement to lower your risk of fractures. Be given hormone replacement therapy (HRT) to treat symptoms of menopause. Follow these instructions at home: Alcohol use Do not drink alcohol if: Your health care provider tells you not to drink. You are pregnant, may be pregnant, or are planning to become pregnant. If you drink alcohol: Limit how much you have to: 0-1 drink a day. Know how much alcohol is in your drink. In the [place] one drink equals one 12 oz bottle of beer (355 mL), one 5 oz glass of wine (148 mL), or one 1 oz glass of hard liquor (44 mL). Lifestyle Do not use any products that contain nicotine or tobacco. These products include cigarettes, chewing tobacco, and vaping devices, such as e-cigarettes. If you need help quitting, ask your health care provider. Do not use street drugs. Do not share needles. Ask your health care provider for help if you need support or information about quitting drugs. General instructions Schedule regular health, dental, and eye exams. Stay current with your vaccines. Tell your health care provider if: You often feel depressed. You have ever been abused or do not feel safe at home. Summary Adopting a healthy lifestyle and getting preventive care are important in promoting health  and wellness. Follow your health care provider's instructions about healthy diet, exercising, and getting tested or screened for diseases. Follow your health care provider's instructions on monitoring your cholesterol and blood pressure. This information is not intended to replace advice given to you by your health care provider. Make sure you discuss any questions you have with your health care provider. Document Revised: 11/22/2020 Document Reviewed: 11/22/2020 Elsevier Patient Education  2023 [employer].

## 2022-11-05 NOTE — Progress Notes (Unsigned)
Subjective:    Patient ID: [name], female    DOB: January 14, 1956, 67 y.o.   MRN: [id number]      HPI [name] is here for a Physical exam and her chronic medical problems.    Overall doing well.  Has no concerns.  Feels better than when I saw her last-still has residual cough which allergies may also be contributing to, but that is improving.  Medications and allergies reviewed with patient and updated if appropriate.  Current Outpatient Medications on File Prior to Visit  Medication Sig Dispense Refill   aspirin 81 MG EC tablet Take 1 tablet (81 mg total) by mouth daily. Swallow whole. 30 tablet 12   Cholecalciferol (VITAMIN D3) 50 MCG (2000 UT) capsule Take 2,000 Units by mouth daily.     Dulaglutide (TRULICITY) 1.5 MG/0.5ML SOPN Inject 1.5 mg into the skin once a week. 2 mL 5   estradiol (ESTRACE) 0.1 MG/GM vaginal cream Place 1 Applicatorful vaginally as directed. Takes twice monthly     ezetimibe (ZETIA) 10 MG tablet TAKE 1 TABLET BY MOUTH EVERY DAY 90 tablet 0   glucose blood (ONETOUCH VERIO) test strip Use to check blood sugar 1 time per day. Dx Code E11.9 100 each 12   KLOR-CON M20 20 MEQ tablet TAKE 1 TABLET BY MOUTH DAILY 30 tablet 3   LOSARTAN POTASSIUM PO Take 25 mg by mouth daily.     metFORMIN (GLUCOPHAGE) 1000 MG tablet Take 1 tablet (1,000 mg total) by mouth daily with breakfast. 90 tablet 3   metoprolol succinate (TOPROL-XL) 25 MG 24 hr tablet Take 1 tablet (25 mg total) by mouth daily. (Patient taking differently: Take 12.5 mg by mouth daily.) 90 tablet 3   Probiotic Product (PROBIOTIC BLEND PO) Take 1 capsule by mouth daily.     rosuvastatin (CRESTOR) 40 MG tablet TAKE ONE TABLET BY MOUTH DAILY 90 tablet 3   No current facility-administered medications on file prior to visit.    Review of Systems  Constitutional:  Negative for fever.  Eyes:  Negative for visual disturbance.  Respiratory:  Positive for cough (residual from URI and from allergies).  Negative for shortness of breath and wheezing.   Cardiovascular:  Negative for chest pain, palpitations and leg swelling.  Gastrointestinal:  Negative for abdominal pain, blood in stool, constipation and diarrhea.       No gerd  Genitourinary:  Negative for dysuria.  Musculoskeletal:  Positive for arthralgias (sometimes knees, wrists, hips, shoulders) and back pain (chronic).  Skin:  Negative for rash.  Neurological:  Negative for dizziness, light-headedness and headaches.  Psychiatric/Behavioral:  Negative for dysphoric mood. The patient is not nervous/anxious.        Objective:   Vitals:   11/07/22 1028  BP: 132/74  Pulse: 77  Temp: 98.4 F (36.9 C)  SpO2: 99%   Filed Weights   11/07/22 1028  Weight: 171 lb (77.6 kg)   Body mass index is 25.25 kg/m.  BP Readings from Last 3 Encounters:  11/07/22 132/74  10/16/22 (!) 170/77  10/09/22 124/70    Wt Readings from Last 3 Encounters:  11/07/22 171 lb (77.6 kg)  10/09/22 172 lb (78 kg)  08/02/22 167 lb (75.8 kg)       Physical Exam Constitutional: She appears well-developed and well-nourished. No distress.  HENT:  Head: Normocephalic and atraumatic.  Right Ear: External ear normal. Normal ear canal and TM Left Ear: External ear normal.  Normal ear canal and TM  Mouth/Throat: Oropharynx is clear and moist.  Eyes: Conjunctivae normal.  Neck: Neck supple. No tracheal deviation present. No thyromegaly present.  No carotid bruit  Cardiovascular: Normal rate, regular rhythm and normal heart sounds.   No murmur heard.  No edema. Pulmonary/Chest: Effort normal and breath sounds normal. No respiratory distress. She has no wheezes. She has no rales.  Breast: deferred   Abdominal: Soft. She exhibits no distension. There is no tenderness.  Lymphadenopathy: She has no cervical adenopathy.  Skin: Skin is warm and dry. She is not diaphoretic.  Psychiatric: She has a normal mood and affect. Her behavior is normal.     Lab  Results  Component Value Date   WBC 4.6 08/02/2022   HGB 11.9 (L) 08/02/2022   HCT 37.5 08/02/2022   PLT 90 (L) 08/02/2022   GLUCOSE 136 (H) 01/06/2022   CHOL 154 10/31/2021   TRIG 124.0 10/31/2021   HDL 43.40 10/31/2021   LDLDIRECT 149.9 02/14/2010   LDLCALC 85 10/31/2021   ALT 33 10/31/2021   AST 51 (H) 10/31/2021   NA 138 01/06/2022   K 3.0 (L) 01/06/2022   CL 100 01/06/2022   CREATININE 1.70 (H) 01/06/2022   BUN 27 (H) 01/06/2022   CO2 26 01/06/2022   TSH 1.48 10/31/2021   INR 1.2 (H) 08/10/2021   HGBA1C 5.7 (A) 07/24/2022   MICROALBUR 23.6 (H) 10/31/2021         Assessment & Plan:   Physical exam: Screening blood work  ordered Exercise  regular - walking Weight  is good Substance abuse  none   Reviewed recommended immunizations.   Health Maintenance  Topic Date Due   Medicare Annual Wellness (AWV)  Never done   DEXA SCAN  Never done   COVID-19 Vaccine (6 - 2023-24 season) 11/23/2022 (Originally 06/14/2022)   Diabetic kidney evaluation - eGFR measurement  01/07/2023   HEMOGLOBIN A1C  01/22/2023   INFLUENZA VACCINE  02/15/2023   Diabetic kidney evaluation - Urine ACR  09/29/2023   FOOT EXAM  10/16/2023   OPHTHALMOLOGY EXAM  10/18/2023   MAMMOGRAM  02/21/2024   DTaP/Tdap/Td (3 - Td or Tdap) 09/14/2024   COLONOSCOPY (Pts 45-30yrs Insurance coverage will need to be confirmed)  02/10/2026   Pneumonia Vaccine 52+ Years old  Completed   Hepatitis C Screening  Completed   Zoster Vaccines- Shingrix  Completed   HPV VACCINES  Aged Out          See Problem List for Assessment and Plan of chronic medical problems.

## 2022-11-07 ENCOUNTER — Ambulatory Visit (INDEPENDENT_AMBULATORY_CARE_PROVIDER_SITE_OTHER): Payer: PPO | Admitting: Internal Medicine

## 2022-11-07 VITALS — BP 132/74 | HR 77 | Temp 98.4°F | Ht 69.0 in | Wt 171.0 lb

## 2022-11-07 DIAGNOSIS — M2550 Pain in unspecified joint: Secondary | ICD-10-CM | POA: Diagnosis not present

## 2022-11-07 DIAGNOSIS — D696 Thrombocytopenia, unspecified: Secondary | ICD-10-CM | POA: Diagnosis not present

## 2022-11-07 DIAGNOSIS — E7849 Other hyperlipidemia: Secondary | ICD-10-CM | POA: Diagnosis not present

## 2022-11-07 DIAGNOSIS — E1151 Type 2 diabetes mellitus with diabetic peripheral angiopathy without gangrene: Secondary | ICD-10-CM | POA: Diagnosis not present

## 2022-11-07 DIAGNOSIS — N1832 Chronic kidney disease, stage 3b: Secondary | ICD-10-CM | POA: Diagnosis not present

## 2022-11-07 DIAGNOSIS — Z0000 Encounter for general adult medical examination without abnormal findings: Secondary | ICD-10-CM | POA: Diagnosis not present

## 2022-11-07 DIAGNOSIS — E2839 Other primary ovarian failure: Secondary | ICD-10-CM

## 2022-11-07 DIAGNOSIS — I10 Essential (primary) hypertension: Secondary | ICD-10-CM

## 2022-11-07 LAB — COMPREHENSIVE METABOLIC PANEL
ALT: 42 U/L — ABNORMAL HIGH (ref 0–35)
AST: 70 U/L — ABNORMAL HIGH (ref 0–37)
Albumin: 4 g/dL (ref 3.5–5.2)
Alkaline Phosphatase: 115 U/L (ref 39–117)
BUN: 29 mg/dL — ABNORMAL HIGH (ref 6–23)
CO2: 21 mEq/L (ref 19–32)
Calcium: 9.4 mg/dL (ref 8.4–10.5)
Chloride: 109 mEq/L (ref 96–112)
Creatinine, Ser: 1.37 mg/dL — ABNORMAL HIGH (ref 0.40–1.20)
GFR: 40.13 mL/min — ABNORMAL LOW (ref >60.00)
Glucose, Bld: 81 mg/dL (ref 70–99)
Potassium: 4.2 mEq/L (ref 3.5–5.1)
Sodium: 140 mEq/L (ref 135–145)
Total Bilirubin: 1 mg/dL (ref 0.2–1.2)
Total Protein: 7.8 g/dL (ref 6.0–8.3)

## 2022-11-07 LAB — CBC WITH DIFFERENTIAL/PLATELET
Basophils Absolute: 0 10*3/uL (ref 0.0–0.1)
Basophils Relative: 0.4 % (ref 0.0–3.0)
Eosinophils Absolute: 0.2 10*3/uL (ref 0.0–0.7)
Eosinophils Relative: 5.3 % — ABNORMAL HIGH (ref 0.0–5.0)
HCT: 39.3 % (ref 36.0–46.0)
Hemoglobin: 13 g/dL (ref 12.0–15.0)
Lymphocytes Relative: 32.2 % (ref 12.0–46.0)
Lymphs Abs: 1.3 10*3/uL (ref 0.7–4.0)
MCHC: 33 g/dL (ref 30.0–36.0)
MCV: 89.6 fl (ref 78.0–100.0)
Monocytes Absolute: 0.2 10*3/uL (ref 0.1–1.0)
Monocytes Relative: 5.8 % (ref 3.0–12.0)
Neutro Abs: 2.3 10*3/uL (ref 1.4–7.7)
Neutrophils Relative %: 56.3 % (ref 43.0–77.0)
Platelets: 83 10*3/uL — ABNORMAL LOW (ref 150.0–400.0)
RBC: 4.38 Mil/uL (ref 3.87–5.11)
RDW: 14.4 % (ref 11.5–15.5)
WBC: 4.1 10*3/uL (ref 4.0–10.5)

## 2022-11-07 LAB — LIPID PANEL
Cholesterol: 150 mg/dL (ref 0–200)
HDL: 65.7 mg/dL (ref >39.00)
LDL Cholesterol: 74 mg/dL (ref 0–99)
NonHDL: 84.79
Total CHOL/HDL Ratio: 2
Triglycerides: 54 mg/dL (ref 0.0–149.0)
VLDL: 10.8 mg/dL (ref 0.0–40.0)

## 2022-11-07 LAB — TSH: TSH: 1.04 u[IU]/mL (ref 0.35–5.50)

## 2022-11-07 MED ORDER — EZETIMIBE 10 MG PO TABS: 10.0000 mg | ORAL_TABLET | Freq: Every day | ORAL | 0 refills | Status: DC

## 2022-11-07 MED ORDER — ROSUVASTATIN CALCIUM 40 MG PO TABS: 40.0000 mg | ORAL_TABLET | Freq: Every day | ORAL | 3 refills | Status: DC

## 2022-11-07 MED ORDER — METOPROLOL SUCCINATE ER 25 MG PO TB24: 25.0000 mg | ORAL_TABLET | Freq: Every day | ORAL | 3 refills | Status: DC

## 2022-11-07 NOTE — Assessment & Plan Note (Addendum)
Chronic Stable Blood pressure well-controlled, sugars well-controlled Continue healthy diet, regular exercise and weight loss efforts Following with nephrology

## 2022-11-07 NOTE — Assessment & Plan Note (Signed)
States some joint pain-fairly diffuse and not consistent No obvious joint swelling Has had negative ANA is part of the workup for thrombocytopenia Does not look like she has had RF, CCP checked-will check ?  Osteoarthritis versus autoimmune arthritis

## 2022-11-07 NOTE — Assessment & Plan Note (Signed)
Chronic Management per Dr. Gherghe Currently on metformin 1000 mg daily and Trulicity 1.5 mg weekly 

## 2022-11-07 NOTE — Assessment & Plan Note (Signed)
Chronic Regular exercise and healthy diet encouraged Check lipid panel  Continue Zetia 10 mg daily, rosuvastatin 40 mg daily 

## 2022-11-07 NOTE — Assessment & Plan Note (Signed)
Chronic Stable Being monitored by hematology/oncology 

## 2022-11-07 NOTE — Assessment & Plan Note (Addendum)
Chronic Blood pressure well controlled Continue metoprolol XL 25 mg daily, losartan 25 mg daily

## 2022-11-09 LAB — CYCLIC CITRUL PEPTIDE ANTIBODY, IGG: Cyclic Citrullin Peptide Ab: <16 UNITS

## 2022-11-09 LAB — RHEUMATOID FACTOR: Rheumatoid fact SerPl-aCnc: <10 IU/mL (ref <14)

## 2022-12-13 ENCOUNTER — Other Ambulatory Visit: Payer: Self-pay | Admitting: Internal Medicine

## 2022-12-19 ENCOUNTER — Other Ambulatory Visit: Payer: Self-pay | Admitting: Internal Medicine

## 2023-01-09 ENCOUNTER — Encounter: Payer: Self-pay | Admitting: Internal Medicine

## 2023-01-17 ENCOUNTER — Encounter: Payer: PPO | Attending: Internal Medicine | Admitting: Nutrition

## 2023-01-17 VITALS — Ht 69.0 in | Wt 167.7 lb

## 2023-01-17 DIAGNOSIS — E1151 Type 2 diabetes mellitus with diabetic peripheral angiopathy without gangrene: Secondary | ICD-10-CM | POA: Insufficient documentation

## 2023-01-17 NOTE — Progress Notes (Signed)
Patient reports that she had lost 50 plus pounds and is slowly putting in back on.  Wants to get 10-15 pounds back off.  Is in stage 3 Kidney failure.   SBGM: meter 1-4X/day, mostly FBS which are are all usually less than 100, with occasional over up to 110. 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  Eats lunch out.  Tries not to eat fast food.   8PM: supper:  protein: 7-10 grams 2 non starchy veg., some times high in fat- one starchy veg.  Water to drink, rarely bread.  Occasional pasta that is whole wheat with red sauce with meat. Rare hs snack.  Discussion:  Need for all 3 food groups in every meal.  Discussion on what foods fall into each food group and how much of each group she should be eating.   Need to keep protein in all forms low at no more than 4 ounces to include all protein, in milk, etc, due to kidney problems. Need to monitor blood sugar readings.  She was given a [employer] 3 with readings going to her phone.  Discussion of blood sugar goal:  ac: less than 100 and 2hr. Pc: less than 140.   Need to stop all fruit juices, and cold cereal and milk.   Need for exercise to keep metabolic rate up.  Goal is 150 minutes /wk of moderate intensity:  Discussed ways to fit this into her day.   Need to limit fat in the diet.  This is her major calorie source. Switch to low fat cottage cheesed, lower fat cheeses, low fat salad dressings.  Handout given on eating out and ways to limit fat,and lower fat options for snacks and eating out.  Discussion that this is what is preventing her from losing weight.  She agreed and had no final questions.   Believe  overall understanding is good and motivation for needed diet and exercise changes are good.  She will call if needing more assistance from me.

## 2023-01-26 ENCOUNTER — Other Ambulatory Visit: Payer: Self-pay | Admitting: Internal Medicine

## 2023-02-01 ENCOUNTER — Inpatient Hospital Stay: Payer: PPO | Admitting: Oncology

## 2023-02-01 ENCOUNTER — Inpatient Hospital Stay: Payer: PPO

## 2023-02-04 ENCOUNTER — Encounter: Payer: Self-pay | Admitting: Internal Medicine

## 2023-02-05 ENCOUNTER — Other Ambulatory Visit: Payer: Self-pay | Admitting: Internal Medicine

## 2023-02-05 MED ORDER — FREESTYLE LIBRE 3 SENSOR MISC: 3 refills | Status: DC

## 2023-02-05 NOTE — Telephone Encounter (Signed)
Pt was provided with a Libre 3. Ok to go ahead and send in a script? She has been checking her blood glucose 1-4x a day via fingerstick.  Please advise,

## 2023-02-06 NOTE — Patient Instructions (Signed)
Read over handouts given on lower fat option choices.  Exercise for 30 minutes 5-6 days/wk.

## 2023-02-13 ENCOUNTER — Telehealth: Payer: Self-pay | Admitting: Radiology

## 2023-02-13 NOTE — Telephone Encounter (Signed)
Left voice mail for patient to call back at [phone] to schedule Medicare Annual Wellness Visit    Last AWV:     Please schedule Sequential/Initial AWV with [facility]   Lalita K. CMA   

## 2023-02-15 ENCOUNTER — Inpatient Hospital Stay: Payer: PPO | Attending: Oncology

## 2023-02-15 ENCOUNTER — Inpatient Hospital Stay: Payer: PPO | Admitting: Oncology

## 2023-02-15 VITALS — BP 140/72 | HR 73 | Temp 98.1°F | Resp 18 | Ht 69.0 in | Wt 180.0 lb

## 2023-02-15 DIAGNOSIS — D696 Thrombocytopenia, unspecified: Secondary | ICD-10-CM

## 2023-02-15 DIAGNOSIS — D649 Anemia, unspecified: Secondary | ICD-10-CM | POA: Insufficient documentation

## 2023-02-15 LAB — BASIC METABOLIC PANEL - CANCER CENTER ONLY
Anion gap: 9 (ref 5–15)
BUN: 25 mg/dL — ABNORMAL HIGH (ref 8–23)
CO2: 23 mmol/L (ref 22–32)
Calcium: 9.8 mg/dL (ref 8.9–10.3)
Chloride: 108 mmol/L (ref 98–111)
Creatinine: 1.39 mg/dL — ABNORMAL HIGH (ref 0.44–1.00)
GFR, Estimated: 42 mL/min — ABNORMAL LOW (ref >60)
Glucose, Bld: 107 mg/dL — ABNORMAL HIGH (ref 70–99)
Potassium: 4.4 mmol/L (ref 3.5–5.1)
Sodium: 140 mmol/L (ref 135–145)

## 2023-02-15 LAB — SEDIMENTATION RATE: Sed Rate: 33 mm/hr — ABNORMAL HIGH (ref 0–22)

## 2023-02-15 LAB — CBC WITH DIFFERENTIAL (CANCER CENTER ONLY)
Abs Immature Granulocytes: 0.01 10*3/uL (ref 0.00–0.07)
Basophils Absolute: 0 10*3/uL (ref 0.0–0.1)
Basophils Relative: 0 %
Eosinophils Absolute: 0.3 10*3/uL (ref 0.0–0.5)
Eosinophils Relative: 7 %
HCT: 38.5 % (ref 36.0–46.0)
Hemoglobin: 12.6 g/dL (ref 12.0–15.0)
Immature Granulocytes: 0 %
Lymphocytes Relative: 29 %
Lymphs Abs: 1.5 10*3/uL (ref 0.7–4.0)
MCH: 30.1 pg (ref 26.0–34.0)
MCHC: 32.7 g/dL (ref 30.0–36.0)
MCV: 91.9 fL (ref 80.0–100.0)
Monocytes Absolute: 0.3 10*3/uL (ref 0.1–1.0)
Monocytes Relative: 6 %
Neutro Abs: 3 10*3/uL (ref 1.7–7.7)
Neutrophils Relative %: 58 %
Platelet Count: 85 10*3/uL — ABNORMAL LOW (ref 150–400)
RBC: 4.19 MIL/uL (ref 3.87–5.11)
RDW: 13 % (ref 11.5–15.5)
WBC Count: 5.2 10*3/uL (ref 4.0–10.5)
nRBC: 0 % (ref 0.0–0.2)

## 2023-02-15 LAB — C-REACTIVE PROTEIN: CRP: 0.5 mg/dL (ref <1.0)

## 2023-02-15 NOTE — Progress Notes (Signed)
  [facility] OFFICE PROGRESS NOTE   Diagnosis: Anemia/thrombocytopenia  INTERVAL HISTORY:   [name] returns as scheduled.  She feels well.  No fever, night sweats, or anorexia.  She reports stiffness and discomfort at the right second MCP joint.  She has chronic swelling at the left foot and ankle.  Objective:  Vital signs in last 24 hours:  Blood pressure (!) 140/72, pulse 73, temperature 98.1 F (36.7 C), temperature source Oral, resp. rate 18, height 5\' 9"  (1.753 m), weight 180 lb (81.6 kg), SpO2 100%.    Lymphatics: No cervical, supraclavicular, axillary, or inguinal nodes Resp: Lungs clear bilaterally Cardio: Regular rate and rhythm GI: No hepatosplenomegaly Vascular: Trace edema at the left ankle and foot Musculoskeletal: Joints without erythema or swelling, examination of the right second MCP joint is unremarkable   Lab Results:  Lab Results  Component Value Date   WBC 5.2 02/15/2023   HGB 12.6 02/15/2023   HCT 38.5 02/15/2023   MCV 91.9 02/15/2023   PLT 85 (L) 02/15/2023   NEUTROABS 3.0 02/15/2023    CMP  Lab Results  Component Value Date   NA 140 11/07/2022   K 4.2 11/07/2022   CL 109 11/07/2022   CO2 21 11/07/2022   GLUCOSE 81 11/07/2022   BUN 29 (H) 11/07/2022   CREATININE 1.37 (H) 11/07/2022   CALCIUM 9.4 11/07/2022   PROT 7.8 11/07/2022   ALBUMIN 4.0 11/07/2022   AST 70 (H) 11/07/2022   ALT 42 (H) 11/07/2022   ALKPHOS 115 11/07/2022   BILITOT 1.0 11/07/2022   GFRNONAA 70.20 02/14/2010   GFRAA 85 04/22/2008     Medications: I have reviewed the patient's current medications.   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  The hemoglobin  is normal today.  She has chronic thrombocytopenia.  I suspect the thrombocytopenia is related to an autoimmune process, potentially a collagen vascular disease.  I have a low clinical suspicion for a hematopoietic malignancy.  She will call for bleeding or bruising.  She will return for an office and lab visit in 8 months.  Thornton Papas, MD  02/15/2023  10:58 AM

## 2023-03-24 ENCOUNTER — Other Ambulatory Visit: Payer: Self-pay | Admitting: Internal Medicine

## 2023-03-25 ENCOUNTER — Other Ambulatory Visit: Payer: Self-pay | Admitting: Internal Medicine

## 2023-03-29 ENCOUNTER — Other Ambulatory Visit: Payer: Self-pay | Admitting: Internal Medicine

## 2023-03-30 ENCOUNTER — Other Ambulatory Visit: Payer: Self-pay | Admitting: Internal Medicine

## 2023-03-30 DIAGNOSIS — E1151 Type 2 diabetes mellitus with diabetic peripheral angiopathy without gangrene: Secondary | ICD-10-CM

## 2023-03-30 MED ORDER — EZETIMIBE 10 MG PO TABS: 10.0000 mg | ORAL_TABLET | Freq: Every day | ORAL | 0 refills | Status: DC

## 2023-04-03 DIAGNOSIS — I129 Hypertensive chronic kidney disease with stage 1 through stage 4 chronic kidney disease, or unspecified chronic kidney disease: Secondary | ICD-10-CM | POA: Diagnosis not present

## 2023-04-03 DIAGNOSIS — D631 Anemia in chronic kidney disease: Secondary | ICD-10-CM | POA: Diagnosis not present

## 2023-04-03 DIAGNOSIS — E1122 Type 2 diabetes mellitus with diabetic chronic kidney disease: Secondary | ICD-10-CM | POA: Diagnosis not present

## 2023-04-03 DIAGNOSIS — N1831 Chronic kidney disease, stage 3a: Secondary | ICD-10-CM | POA: Diagnosis not present

## 2023-04-04 LAB — LAB REPORT - SCANNED
Albumin, Urine POC: 68.9
Creatinine, POC: 107.4 mg/dL
Microalb Creat Ratio: 64

## 2023-04-23 ENCOUNTER — Other Ambulatory Visit: Payer: Self-pay | Admitting: Internal Medicine

## 2023-04-23 DIAGNOSIS — E1151 Type 2 diabetes mellitus with diabetic peripheral angiopathy without gangrene: Secondary | ICD-10-CM

## 2023-05-09 ENCOUNTER — Ambulatory Visit: Payer: PPO | Admitting: Internal Medicine

## 2023-05-23 DIAGNOSIS — N1831 Chronic kidney disease, stage 3a: Secondary | ICD-10-CM | POA: Diagnosis not present

## 2023-05-25 ENCOUNTER — Ambulatory Visit: Payer: PPO | Admitting: Internal Medicine

## 2023-06-10 NOTE — Patient Instructions (Addendum)
      Blood work was ordered.       Medications changes include :   None      Return in about 6 months (around 12/09/2023) for Physical Exam.

## 2023-06-10 NOTE — Progress Notes (Unsigned)
Subjective:    Patient ID: [name], female    DOB: [birth year], 67 y.o.   MRN: [id number]     HPI [name] is here for follow up of her chronic medical problems.  Walking her dog 3 times a day.  She is compliant with a diabetic diet.  Losartan dose was increased by nephrology-repeat BMP in our office was good.  Medications and allergies reviewed with patient and updated if appropriate.  Current Outpatient Medications on File Prior to Visit  Medication Sig Dispense Refill   aspirin 81 MG EC tablet Take 1 tablet (81 mg total) by mouth daily. Swallow whole. 30 tablet 12   Cholecalciferol (VITAMIN D3) 50 MCG (2000 UT) capsule Take 2,000 Units by mouth daily.     Continuous Glucose Sensor (FREESTYLE LIBRE 3 SENSOR) MISC Place 1 sensor on the skin every 14 days. Use to check glucose continuously 2 each 3   Dulaglutide (TRULICITY) 1.5 MG/0.5ML SOPN INJECT 1.5 MG UNDER THE SKIN ONCE WEEKLY 6 mL 3   estradiol (ESTRACE) 0.1 MG/GM vaginal cream Place 1 Applicatorful vaginally as directed. Takes twice monthly     ezetimibe (ZETIA) 10 MG tablet Take 1 tablet (10 mg total) by mouth daily. 90 tablet 0   losartan (COZAAR) 50 MG tablet Take 50 mg by mouth daily.     metFORMIN (GLUCOPHAGE) 1000 MG tablet Take 1 tablet (1,000 mg total) by mouth daily with breakfast. 90 tablet 3   metoprolol succinate (TOPROL-XL) 25 MG 24 hr tablet TAKE ONE TABLET BY MOUTH DAILY 90 tablet 3   rosuvastatin (CRESTOR) 40 MG tablet Take 1 tablet (40 mg total) by mouth daily. 90 tablet 3   No current facility-administered medications on file prior to visit.     Review of Systems  Constitutional:  Negative for fever.  Respiratory:  Negative for cough, shortness of breath and wheezing.   Cardiovascular:  Negative for chest pain, palpitations and leg swelling.  Neurological:  Negative for light-headedness and headaches.       Objective:   Vitals:   06/11/23 0847  BP: 120/76  Pulse: 72  Temp: 98.3 F  (36.8 C)  SpO2: 98%   BP Readings from Last 3 Encounters:  06/11/23 120/76  02/15/23 (!) 140/72  11/07/22 132/74   Wt Readings from Last 3 Encounters:  06/11/23 173 lb (78.5 kg)  02/15/23 180 lb (81.6 kg)  01/22/23 167 lb 11.2 oz (76.1 kg)   Body mass index is 25.55 kg/m.    Physical Exam Constitutional:      General: She is not in acute distress.    Appearance: Normal appearance.  HENT:     Head: Normocephalic and atraumatic.  Eyes:     Conjunctiva/sclera: Conjunctivae normal.  Cardiovascular:     Rate and Rhythm: Normal rate and regular rhythm.     Heart sounds: Normal heart sounds.  Pulmonary:     Effort: Pulmonary effort is normal. No respiratory distress.     Breath sounds: Normal breath sounds. No wheezing.  Musculoskeletal:     Cervical back: Neck supple.     Right lower leg: No edema.     Left lower leg: No edema.  Lymphadenopathy:     Cervical: No cervical adenopathy.  Skin:    General: Skin is warm and dry.     Findings: No rash.  Neurological:     Mental Status: She is alert. Mental status is at baseline.  Psychiatric:  Mood and Affect: Mood normal.        Behavior: Behavior normal.        Lab Results  Component Value Date   WBC 5.2 02/15/2023   HGB 12.6 02/15/2023   HCT 38.5 02/15/2023   PLT 85 (L) 02/15/2023   GLUCOSE 107 (H) 02/15/2023   CHOL 150 11/07/2022   TRIG 54.0 11/07/2022   HDL 65.70 11/07/2022   LDLDIRECT 149.9 02/14/2010   LDLCALC 74 11/07/2022   ALT 42 (H) 11/07/2022   AST 70 (H) 11/07/2022   NA 140 02/15/2023   K 4.4 02/15/2023   CL 108 02/15/2023   CREATININE 1.39 (H) 02/15/2023   BUN 25 (H) 02/15/2023   CO2 23 02/15/2023   TSH 1.04 11/07/2022   INR 1.2 (H) 08/10/2021   HGBA1C 5.7 (A) 07/24/2022   MICROALBUR 23.6 (H) 10/31/2021     Assessment & Plan:    See Problem List for Assessment and Plan of chronic medical problems.

## 2023-06-11 ENCOUNTER — Ambulatory Visit (INDEPENDENT_AMBULATORY_CARE_PROVIDER_SITE_OTHER): Payer: PPO | Admitting: Internal Medicine

## 2023-06-11 VITALS — BP 120/76 | HR 72 | Temp 98.3°F | Ht 69.0 in | Wt 173.0 lb

## 2023-06-11 DIAGNOSIS — E1151 Type 2 diabetes mellitus with diabetic peripheral angiopathy without gangrene: Secondary | ICD-10-CM | POA: Diagnosis not present

## 2023-06-11 DIAGNOSIS — D696 Thrombocytopenia, unspecified: Secondary | ICD-10-CM | POA: Diagnosis not present

## 2023-06-11 DIAGNOSIS — N1832 Chronic kidney disease, stage 3b: Secondary | ICD-10-CM | POA: Diagnosis not present

## 2023-06-11 DIAGNOSIS — E7849 Other hyperlipidemia: Secondary | ICD-10-CM

## 2023-06-11 DIAGNOSIS — I10 Essential (primary) hypertension: Secondary | ICD-10-CM | POA: Diagnosis not present

## 2023-06-11 NOTE — Assessment & Plan Note (Addendum)
Chronic Stable Following with hematology/oncology

## 2023-06-11 NOTE — Assessment & Plan Note (Addendum)
Chronic Blood pressure well controlled We will hold off on blood work since she just had blood work with nephrology Continue metoprolol XL 25 mg daily, losartan 50 mg daily

## 2023-06-11 NOTE — Assessment & Plan Note (Addendum)
Chronic Regular exercise and healthy diet encouraged LDL has been well-controlled Continue Zetia 10 mg daily, rosuvastatin 40 mg daily

## 2023-06-11 NOTE — Assessment & Plan Note (Addendum)
Chronic Stable Blood pressure well-controlled, sugars well-controlled Continue healthy diet, regular exercise and weight loss efforts Following with nephrology On losartan 50 mg daily Just had BMP with nephrology

## 2023-06-11 NOTE — Assessment & Plan Note (Signed)
Chronic Management per Dr. Elvera Lennox Lab Results  Component Value Date   HGBA1C 5.7 (A) 07/24/2022   Currently on metformin 1000 mg daily and Trulicity 1.5 mg weekly

## 2023-06-13 ENCOUNTER — Encounter: Payer: Self-pay | Admitting: Internal Medicine

## 2023-06-13 ENCOUNTER — Ambulatory Visit: Payer: PPO | Admitting: Internal Medicine

## 2023-06-13 VITALS — BP 138/80 | HR 86 | Ht 69.0 in | Wt 173.8 lb

## 2023-06-13 DIAGNOSIS — E1151 Type 2 diabetes mellitus with diabetic peripheral angiopathy without gangrene: Secondary | ICD-10-CM | POA: Diagnosis not present

## 2023-06-13 DIAGNOSIS — E66811 Obesity, class 1: Secondary | ICD-10-CM

## 2023-06-13 DIAGNOSIS — E7849 Other hyperlipidemia: Secondary | ICD-10-CM

## 2023-06-13 DIAGNOSIS — Z7985 Long-term (current) use of injectable non-insulin antidiabetic drugs: Secondary | ICD-10-CM

## 2023-06-13 LAB — POCT GLYCOSYLATED HEMOGLOBIN (HGB A1C): Hemoglobin A1C: 5.4 % (ref 4.0–5.6)

## 2023-06-13 MED ORDER — FREESTYLE LIBRE 3 PLUS SENSOR MISC: 1.0000 | 3 refills | Status: DC

## 2023-06-13 NOTE — Patient Instructions (Addendum)
Please continue: - Trulicity 1.5 mg weekly  Stop Metformin.  Let me know if the sugars increase afterwards.   Please return in 6 months with your sugar log.

## 2023-06-13 NOTE — Progress Notes (Signed)
Patient ID: DAVIN Tullier, female   DOB: [birth year], 67 y.o.   MRN: [id number]  HPI: KHOLE MEGA is a pleasant 67 y.o.-year-old female, presenting for follow-up for DM2, dx in 2000, non-insulin-dependent, uncontrolled, with complications (PVD, CKD, DR).  Last visit 10 months ago.  Interim history: No increased urination, blurry vision, nausea, chest pain.  She was able to start the CGM since last visit.  This is expensive for her for now, while in the donut hole, but starting in the new year, it will be free for her.  Reviewed HbA1c levels: Lab Results  Component Value Date   HGBA1C 5.7 (A) 07/24/2022   HGBA1C 5.2 01/19/2022   HGBA1C 5.8 (A) 06/30/2021  01/13/2021: HbA1c 6.1%  Pt is on a regimen of: - Metformin 1000 mg with b'fast  - not using - Trulicity 1.5 >> 3 >> 1.5 mg weekly She was on Farxiga 10 mg daily in am >> stopped 08/2017 after mixup at the pharmacy. She also had a rash on hands. She was on [employer] stopped covering it. She was on Kombiglyze = Metformin + Onglyza (Saxagliptin) - 1000-2.5 mg daily >> rash.  Pt checks her sugars more than 4 times a day with her freestyle libre 3 CGM:  Previously: - am: 61, 65, 74-133 >> 89-122, 132 >> 100-117, 140 - 2h after b'fast: 130, 218 >> n/c >> 130 >> 138 - before lunch: n/c >> 138 >> n/c >> 76, 112 - 2h after lunch: 170-175 >> n/c >> 180 >> 150 >> n/c - before dinner: 120-128 >> n/c >> 118 >> n/c >> 90 - 2h after dinner: n/c >> 214 >> n/c - bedtime: n/c - nighttime: n/c Lowest sugar was: 74 >> 61 >> 89 >> 60 (after cereals + banana, followed by increased activity) >> 53; she has  hypoglycemia awareness in the 80. Highest sugar was: 214 >> 133 >> 132 >> 140 >> 225.  Glucometer: One Touch Ultra  -+ CKD, last BUN/creatinine:  Lab Results  Component Value Date   BUN 25 (H) 02/15/2023   CREATININE 1.39 (H) 02/15/2023   Lab Results  Component Value Date   GFRAA 85 04/22/2008   GFRAA 85 11/22/2006   Lab  Results  Component Value Date   MICRALBCREAT 64.0 04/04/2023   MICRALBCREAT 32 09/29/2022   MICRALBCREAT 11.7 10/31/2021   MICRALBCREAT 0.8 08/26/2020   MICRALBCREAT 0.7 06/07/2015   MICRALBCREAT 0.8 02/03/2015   MICRALBCREAT 0.5 06/05/2014   MICRALBCREAT 0.4 04/05/2012   MICRALBCREAT 0.6 11/01/2010   MICRALBCREAT 0.4 02/14/2010  On benazepril prev., now Losartan increased to 50 mg daily.  -+ HL. Last set of lipids: Lab Results  Component Value Date   CHOL 150 11/07/2022   HDL 65.70 11/07/2022   LDLCALC 74 11/07/2022   LDLDIRECT 149.9 02/14/2010   TRIG 54.0 11/07/2022   CHOLHDL 2 11/07/2022  On Crestor 40 and Zetia 10.  - last eye exam was  10/18/2022: No DR; she has a history of hemorrhage in OS.  -+ Numbness and tingling in her feet developed after spine surgery.  Last foot exam 10/16/2022.  She also has HTN, Had back surgery 2007. Back pain increased 02/2015 >> saw Dr Katrinka Blazing >> started Gabapentin >> disequilibrium. She was on steroids then >> sugars higher. In 03/2015: spine steroid inj.  ROS: + see HPI  I reviewed pt's medications, allergies, PMH, social hx, family hx, and changes were documented in the history of present illness. Otherwise, unchanged from  my initial visit note.  Past Medical History:  Diagnosis Date   Allergy    Anxiety    Arthritis    Colitis    in her 67s; quiescent since   DM (diabetes mellitus) (HCC)    HTN (hypertension)    Hyperlipidemia    Past Surgical History:  Procedure Laterality Date   CERVIX SURGERY  2001   Cryosurgery, Dr Roberto Scales   COLONOSCOPY  2009   Negative, Dr. Lina Sar   FOOT SURGERY     bone spurs resected, Dr Josephina Gip- 2019   KNEE ARTHROSCOPY  1980   Right   LAMINECTOMY  2007   X 2, Dr Darrelyn Hillock   Social History   Social History   Marital Status: Divorced    Spouse Name: N/A   Number of Children: 1   Occupational History   Interior and spatial designer of [employer]   Social History Main Topics   Smoking status: Former  Smoker    Quit date: 1996   Alcohol Use: Yes     Comment:  rarely   Drug Use: No   Current Outpatient Medications on File Prior to Visit  Medication Sig Dispense Refill   aspirin 81 MG EC tablet Take 1 tablet (81 mg total) by mouth daily. Swallow whole. 30 tablet 12   Cholecalciferol (VITAMIN D3) 50 MCG (2000 UT) capsule Take 2,000 Units by mouth daily.     Continuous Glucose Sensor (FREESTYLE LIBRE 3 SENSOR) MISC Place 1 sensor on the skin every 14 days. Use to check glucose continuously 2 each 3   Dulaglutide (TRULICITY) 1.5 MG/0.5ML SOPN INJECT 1.5 MG UNDER THE SKIN ONCE WEEKLY 6 mL 3   estradiol (ESTRACE) 0.1 MG/GM vaginal cream Place 1 Applicatorful vaginally as directed. Takes twice monthly     ezetimibe (ZETIA) 10 MG tablet Take 1 tablet (10 mg total) by mouth daily. 90 tablet 0   losartan (COZAAR) 50 MG tablet Take 50 mg by mouth daily.     metFORMIN (GLUCOPHAGE) 1000 MG tablet Take 1 tablet (1,000 mg total) by mouth daily with breakfast. 90 tablet 3   metoprolol succinate (TOPROL-XL) 25 MG 24 hr tablet TAKE ONE TABLET BY MOUTH DAILY 90 tablet 3   rosuvastatin (CRESTOR) 40 MG tablet Take 1 tablet (40 mg total) by mouth daily. 90 tablet 3   No current facility-administered medications on file prior to visit.   Allergies  Allergen Reactions   Atorvastatin     REACTION: ELEVATES LFT'S. Pt states she is currently taking Atorvastatin 40 mg (1/2 tablet daily) and does not recall hab=ving any issues with it. (07/14/14)   Tramadol Nausea And Vomiting   Codeine Nausea Only        Neomycin-Bacitracin Zn-Polymyx Rash   Onglyza [Saxagliptin Hydrochloride] Rash    This occurred after starting Kombiglyze . This was present as a rash over the hands and feet dorsally. She has been able to take metformin in the past as well as Janumet without problems.   Sulfamethoxazole-Trimethoprim Nausea And Vomiting     Nausea was extreme.     Family History  Problem Relation Age of Onset   Diabetes  Father    Hypertension Father    Heart attack Father 60       precipitated by bleeding ulcer   Diabetes Mother    Hypertension Mother    Coronary artery disease Mother    Heart attack Paternal Grandfather        ? 68   Heart attack Maternal  Grandfather 68   Diabetes Maternal Grandmother    Stroke Maternal Grandmother 85   Cancer Neg Hx    Colon cancer Neg Hx    Esophageal cancer Neg Hx    Rectal cancer Neg Hx    Stomach cancer Neg Hx    PE: BP 138/80   Pulse 86   Ht 5\' 9"  (1.753 m)   Wt 173 lb 12.8 oz (78.8 kg)   SpO2 98%   BMI 25.67 kg/m    Wt Readings from Last 10 Encounters:  06/13/23 173 lb 12.8 oz (78.8 kg)  06/11/23 173 lb (78.5 kg)  02/15/23 180 lb (81.6 kg)  01/22/23 167 lb 11.2 oz (76.1 kg)  11/07/22 171 lb (77.6 kg)  10/09/22 172 lb (78 kg)  08/02/22 167 lb (75.8 kg)  07/24/22 168 lb 9.6 oz (76.5 kg)  05/05/22 165 lb (74.8 kg)  03/03/22 163 lb 3.2 oz (74 kg)   Constitutional: Normal weight, in NAD Eyes:  EOMI, no exophthalmos ENT: no neck masses, no cervical lymphadenopathy Cardiovascular: RRR, No MRG Respiratory: CTA B Musculoskeletal: no deformities Skin:no rashes Neurological: no tremor with outstretched hands  ASSESSMENT: 1. DM2, non-insulin-dependent, uncontrolled, with complications - PVD - CKD - DR  2. HL  3.  Overweight  PLAN:  1. Patient with previously uncontrolled type 2 diabetes, with significant improvement in control after she started to improve diet and be compliant with her diabetic medications.  HbA1c decreased to a nadir of 5.2%.  Sugars were still excellent at last visit also slightly higher.  HbA1c was 5.7%.  We did not change her regimen.  She continues on metformin and Trulicity. CGM interpretation: -At today's visit, we reviewed her CGM downloads: It appears that 91% of values are in target range (goal >70%), while 4% are higher than 180 (goal <25%), and 5% are lower than 70 (goal <4%).  The calculated average blood sugar  is 109.  The projected HbA1c for the next 3 months (GMI) is 5.9%. -Reviewing the CGM trends, sugars appear to be controlled, mostly fluctuating within the target range but she does have low blood sugars overnight and midday.  We discussed about possibly needing to back off the Trulicity dose but she would like to avoid this if possible as she did gain some weight since last visit.  In that case, I advised her to stop metformin.  We discussed that if the low blood sugars continue, we may need to decrease Trulicity dose but also possibly add an SGLT2 inhibitor.  She agrees with the plan. - I suggested to:  Patient Instructions  Please continue: - Trulicity 1.5 mg weekly  Stop Metformin.  Let me know if the sugars increase afterwards.   Please return in 6 months with your sugar log.  - we checked her HbA1c: 5.4% (lower, excellent) - advised to check sugars at different times of the day - 4x a day, rotating check times - advised for yearly eye exams >> she is UTD - return to clinic in 6 months  2. HL -R reviewed latest lipid panel from 10/2022: LDL above our target of less than 55 due to cardiovascular disease, however, LDL decreased to 50% from the original value from 2011; otherwise fractions at goal: Lab Results  Component Value Date   CHOL 150 11/07/2022   HDL 65.70 11/07/2022   LDLCALC 74 11/07/2022   LDLDIRECT 149.9 02/14/2010   TRIG 54.0 11/07/2022   CHOLHDL 2 11/07/2022  -She is on Crestor 40 mg daily  and Zetia 10 mg daily without side effects  3. Overweight -She continues Trulicity 1.5 mg weekly.  We did reduce the dose before last visit due to significant improvement in weight. -She last 45 pounds on Trulicity, but gained 12 before last visit -She gained 6 pounds net since our last visit, however, in the last 3 months, she lost 7 pounds  Carlus Pavlov, MD PhD [facility]

## 2023-06-18 ENCOUNTER — Encounter: Payer: Self-pay | Admitting: Internal Medicine

## 2023-06-18 ENCOUNTER — Telehealth (INDEPENDENT_AMBULATORY_CARE_PROVIDER_SITE_OTHER): Payer: PPO | Admitting: Internal Medicine

## 2023-06-18 DIAGNOSIS — J029 Acute pharyngitis, unspecified: Secondary | ICD-10-CM | POA: Diagnosis not present

## 2023-06-18 MED ORDER — AMOXICILLIN-POT CLAVULANATE 875-125 MG PO TABS: 1.0000 | ORAL_TABLET | Freq: Two times a day (BID) | ORAL | 0 refills | Status: AC

## 2023-06-18 NOTE — Assessment & Plan Note (Signed)
Acute Covid neg at home H/o strep and this feels similar Has white patches in tonsil area ? Strep pharyngitis Will treat empirically Start augmentin bid x 10 days Otc pain relievers prn Fluids, rest Call if no improvement

## 2023-06-18 NOTE — Progress Notes (Signed)
Virtual Visit via Video Note  I connected with [name] on 06/18/23 at  2:40 PM EST by a video enabled telemedicine application and verified that I am speaking with the correct person using two identifiers.   I discussed the limitations of evaluation and management by telemedicine and the availability of in person appointments. The patient expressed understanding and agreed to proceed.  Present for the visit:  Myself, Dr Cheryll Cockayne, Mardene Celeste.  The patient is currently at home and I am in the office.    No referring provider.    History of Present Illness: She is here for an acute visit for cold symptoms.   Her symptoms started about one week ago  She is experiencing sore throat - has seen dots on one tonsil and a larger white patch on the other tonsil. She has had subjective low grace fever, chills, fatigue, nasal congestion,runny nose and loss of taste/smell.  No cough.  No headaches.  Has h/o strep pharyngitis.    Home covid test is neg.   Review of Systems  Constitutional:  Positive for chills, fever (subjective low grade fever) and malaise/fatigue. Negative for diaphoresis.  HENT:  Positive for congestion and sore throat. Negative for ear pain and sinus pain.        Runny nose, loss of taste and smell  Respiratory:  Negative for cough, shortness of breath and wheezing.   Neurological:  Negative for dizziness and headaches.      Social History   Socioeconomic History   Marital status: Divorced    Spouse name: Not on file   Number of children: 1   Years of education: Not on file   Highest education level: Master's degree (e.g., MA, MS, MEng, MEd, MSW, MBA)  Occupational History    Employer: [employer]  Tobacco Use   Smoking status: Former    Current packs/day: 0.00    Types: Cigarettes    Quit date: 07/17/1977    Years since quitting: 45.9   Smokeless tobacco: Never   Tobacco comments:    Smoked in [place] Only ; 1975-1979, up to 1/2  ppd  Vaping Use   Vaping status: Never Used  Substance and Sexual Activity   Alcohol use: Never   Drug use: No   Sexual activity: Not on file  Other Topics Concern   Not on file  Social History Narrative   Not on file   Social Determinants of Health   Financial Resource Strain: Low Risk  (06/11/2023)   Overall Financial Resource Strain (CARDIA)    Difficulty of Paying Living Expenses: Not hard at all  Food Insecurity: No Food Insecurity (06/11/2023)   Hunger Vital Sign    Worried About Running Out of Food in the Last Year: Never true    Ran Out of Food in the Last Year: Never true  Transportation Needs: No Transportation Needs (06/11/2023)   PRAPARE - Administrator, Civil Service (Medical): No    Lack of Transportation (Non-Medical): No  Physical Activity: Insufficiently Active (06/11/2023)   Exercise Vital Sign    Days of Exercise per Week: 7 days    Minutes of Exercise per Session: 20 min  Stress: Stress Concern Present (06/11/2023)   [employer] of Occupational Health - Occupational Stress Questionnaire    Feeling of Stress : To some extent  Social Connections: Moderately Integrated (06/11/2023)   Social Connection and Isolation Panel [NHANES]    Frequency of Communication with Friends and  Family: More than three times a week    Frequency of Social Gatherings with Friends and Family: Twice a week    Attends Religious Services: More than 4 times per year    Active Member of [employer] or Organizations: Yes    Attends Engineer, structural: More than 4 times per year    Marital Status: Divorced     Observations/Objective: Appears well in NAD Breathing normally  Assessment and Plan:  See Problem List for Assessment and Plan of chronic medical problems.   Follow Up Instructions:    I discussed the assessment and treatment plan with the patient. The patient was provided an opportunity to ask questions and all were answered. The patient agreed  with the plan and demonstrated an understanding of the instructions.   The patient was advised to call back or seek an in-person evaluation if the symptoms worsen or if the condition fails to improve as anticipated.    Pincus Sanes, MD

## 2023-06-21 ENCOUNTER — Encounter: Payer: Self-pay | Admitting: Internal Medicine

## 2023-06-27 ENCOUNTER — Encounter: Payer: Self-pay | Admitting: Internal Medicine

## 2023-07-17 ENCOUNTER — Encounter: Payer: Self-pay | Admitting: Internal Medicine

## 2023-07-17 DIAGNOSIS — E1151 Type 2 diabetes mellitus with diabetic peripheral angiopathy without gangrene: Secondary | ICD-10-CM

## 2023-07-19 MED ORDER — TRULICITY 1.5 MG/0.5ML ~~LOC~~ SOAJ: 1.5000 mg | SUBCUTANEOUS | 3 refills | Status: DC

## 2023-07-26 ENCOUNTER — Ambulatory Visit (INDEPENDENT_AMBULATORY_CARE_PROVIDER_SITE_OTHER): Payer: PPO

## 2023-07-26 VITALS — Ht 69.0 in | Wt 173.0 lb

## 2023-07-26 DIAGNOSIS — Z0000 Encounter for general adult medical examination without abnormal findings: Secondary | ICD-10-CM | POA: Diagnosis not present

## 2023-07-26 NOTE — Progress Notes (Signed)
 Subjective:   [name] is a 68 y.o. female who presents for an Initial Medicare Annual Wellness Visit.  Visit Complete: Virtual I connected with  [name] on 07/26/23 by a audio enabled telemedicine application and verified that I am speaking with the correct person using two identifiers.  Patient Location: Home  Provider Location: Office/Clinic  I discussed the limitations of evaluation and management by telemedicine. The patient expressed understanding and agreed to proceed.  Vital Signs: Because this visit was a virtual/telehealth visit, some criteria may be missing or patient reported. Any vitals not documented were not able to be obtained and vitals that have been documented are patient reported.   Cardiac Risk Factors include: advanced age (>10men, >55 women);hypertension;diabetes mellitus;Other (see comment);dyslipidemia, Risk factor comments: Fatty liver, Carotid artery stenosis, CKD     Objective:    Today's Vitals   07/26/23 1138  Weight: 173 lb (78.5 kg)  Height: 5' 9 (1.753 m)   Body mass index is 25.55 kg/m.     07/26/2023   11:47 AM 02/15/2023   10:44 AM 08/02/2022   12:32 PM 03/03/2022   10:39 AM 12/01/2021    3:18 PM 06/01/2021   11:29 AM 10/26/2014   12:12 AM  Advanced Directives  Does Patient Have a Medical Advance Directive? No No No No No No No  Would patient like information on creating a medical advance directive?  No - Patient declined No - Patient declined No - Patient declined No - Patient declined No - Patient declined No - patient declined information    Current Medications (verified) Outpatient Encounter Medications as of 07/26/2023  Medication Sig   aspirin  81 MG EC tablet Take 1 tablet (81 mg total) by mouth daily. Swallow whole.   Cholecalciferol (VITAMIN D3) 50 MCG (2000 UT) capsule Take 2,000 Units by mouth daily.   Continuous Glucose Sensor (FREESTYLE LIBRE 3 PLUS SENSOR) MISC 1 each by Does not apply route every 14 (fourteen)  days.   Dulaglutide  (TRULICITY ) 1.5 MG/0.5ML SOAJ Inject 1.5 mg into the skin once a week. INJECT 1.5 MG UNDER THE SKIN ONCE WEEKLY   estradiol (ESTRACE) 0.1 MG/GM vaginal cream Place 1 Applicatorful vaginally as directed. Takes twice monthly   ezetimibe  (ZETIA ) 10 MG tablet Take 1 tablet (10 mg total) by mouth daily.   losartan (COZAAR) 50 MG tablet Take 50 mg by mouth daily.   metoprolol  succinate (TOPROL -XL) 25 MG 24 hr tablet TAKE ONE TABLET BY MOUTH DAILY   rosuvastatin  (CRESTOR ) 40 MG tablet Take 1 tablet (40 mg total) by mouth daily.   No facility-administered encounter medications on file as of 07/26/2023.    Allergies (verified) Atorvastatin , Tramadol , Codeine, Neomycin-bacitracin zn-polymyx, Onglyza [saxagliptin  hydrochloride], and Sulfamethoxazole-trimethoprim   History: Past Medical History:  Diagnosis Date   Allergy    Anxiety    Arthritis    Colitis    in her 1s; quiescent since   DM (diabetes mellitus) (HCC)    HTN (hypertension)    Hyperlipidemia    Past Surgical History:  Procedure Laterality Date   CERVIX SURGERY  2001   Cryosurgery, Dr Elsa   COLONOSCOPY  2009   Negative, Dr. Princella Nida   FOOT SURGERY     bone spurs resected, Dr Celia- 2019   KNEE ARTHROSCOPY  1980   Right   LAMINECTOMY  2007   X 2, Dr Heide   Family History  Problem Relation Age of Onset   Diabetes Father    Hypertension  Father    Heart attack Father 71       precipitated by bleeding ulcer   Diabetes Mother    Hypertension Mother    Coronary artery disease Mother    Heart attack Paternal Grandfather        ? 6   Heart attack Maternal Grandfather 61   Diabetes Maternal Grandmother    Stroke Maternal Grandmother 65   Cancer Neg Hx    Colon cancer Neg Hx    Esophageal cancer Neg Hx    Rectal cancer Neg Hx    Stomach cancer Neg Hx    Social History   Socioeconomic History   Marital status: Divorced    Spouse name: Not on file   Number of children: 1   Years of  education: Not on file   Highest education level: Master's degree (e.g., MA, MS, MEng, MEd, MSW, MBA)  Occupational History   Occupation: RETIRED    Employer: [employer]  Tobacco Use   Smoking status: Former    Current packs/day: 0.00    Types: Cigarettes    Quit date: 07/17/1977    Years since quitting: 46.0   Smokeless tobacco: Never   Tobacco comments:    Smoked in [place] Only ; 1975-1979, up to 1/2 ppd  Vaping Use   Vaping status: Never Used  Substance and Sexual Activity   Alcohol use: Never   Drug use: No   Sexual activity: Not on file  Other Topics Concern   Not on file  Social History Narrative   Lives alone with 1 dog-2025   Social Drivers of Health   Financial Resource Strain: Low Risk  (07/26/2023)   Overall Financial Resource Strain (CARDIA)    Difficulty of Paying Living Expenses: Not hard at all  Food Insecurity: No Food Insecurity (07/26/2023)   Hunger Vital Sign    Worried About Running Out of Food in the Last Year: Never true    Ran Out of Food in the Last Year: Never true  Transportation Needs: No Transportation Needs (07/26/2023)   PRAPARE - Administrator, Civil Service (Medical): No    Lack of Transportation (Non-Medical): No  Physical Activity: Sufficiently Active (07/26/2023)   Exercise Vital Sign    Days of Exercise per Week: 7 days    Minutes of Exercise per Session: 60 min  Recent Concern: Physical Activity - Insufficiently Active (06/11/2023)   Exercise Vital Sign    Days of Exercise per Week: 7 days    Minutes of Exercise per Session: 20 min  Stress: No Stress Concern Present (07/26/2023)   [employer] of Occupational Health - Occupational Stress Questionnaire    Feeling of Stress : Not at all  Recent Concern: Stress - Stress Concern Present (06/11/2023)   [employer] of Occupational Health - Occupational Stress Questionnaire    Feeling of Stress : To some extent  Social Connections: Moderately Integrated  (07/26/2023)   Social Connection and Isolation Panel [NHANES]    Frequency of Communication with Friends and Family: More than three times a week    Frequency of Social Gatherings with Friends and Family: Twice a week    Attends Religious Services: More than 4 times per year    Active Member of [employer] or Organizations: Yes    Attends Banker Meetings: Never    Marital Status: Divorced    Tobacco Counseling Counseling given: Not Answered Tobacco comments: Smoked in [place] Only ; [phone], up to 1/2  ppd   Clinical Intake:  Pre-visit preparation completed: Yes  Pain : No/denies pain     BMI - recorded: 25.55 Nutritional Status: BMI 25 -29 Overweight Nutritional Risks: None Diabetes: Yes Did pt. bring in CBG monitor from home?: No     Interpreter Needed?: No  Information entered by :: Brannan Cassedy, RMA   Activities of Daily Living    07/26/2023   11:39 AM  In your present state of health, do you have any difficulty performing the following activities:  Hearing? 0  Vision? 0  Difficulty concentrating or making decisions? 0  Walking or climbing stairs? 0  Dressing or bathing? 0  Doing errands, shopping? 0  Preparing Food and eating ? N  Using the Toilet? N  In the past six months, have you accidently leaked urine? N  Do you have problems with loss of bowel control? N  Managing your Medications? N  Managing your Finances? N  Housekeeping or managing your Housekeeping? N    Patient Care Team: Geofm Glade PARAS, MD as PCP - General (Internal Medicine) Raelyn Betters [facility])  Indicate any recent Medical Services you may have received from other than [facility] providers in the past year (date may be approximate).     Assessment:   This is a routine wellness examination for [name].  Hearing/Vision screen Hearing Screening - Comments:: Denies hearing difficulties   Vision Screening - Comments:: Wears eyeglasses   Goals Addressed               This  Visit's Progress     Patient Stated (pt-stated)        Join Silver sneakers and lose 10 lb      Depression Screen    07/26/2023   11:54 AM 06/11/2023    8:56 AM 10/09/2022    4:03 PM 11/28/2021    2:00 PM 08/26/2020   10:26 AM 07/08/2019    9:29 AM 07/04/2017    8:12 AM  PHQ 2/9 Scores  PHQ - 2 Score 0 0 0 0 3 0 0  PHQ- 9 Score 1 0  0 10      Fall Risk    07/26/2023   11:47 AM 06/11/2023    8:56 AM 10/09/2022    4:03 PM 11/28/2021    2:00 PM 04/25/2021    9:37 AM  Fall Risk   Falls in the past year? 1  0 0 0  Number falls in past yr:  0 0 0 0  Injury with Fall? 0 0 0 0 0  Risk for fall due to :  No Fall Risks No Fall Risks No Fall Risks No Fall Risks  Follow up Falls evaluation completed;Falls prevention discussed Falls evaluation completed Falls evaluation completed Education provided Falls evaluation completed    MEDICARE RISK AT HOME: Medicare Risk at Home Any stairs in or around the home?: No Home free of loose throw rugs in walkways, pet beds, electrical cords, etc?: Yes Adequate lighting in your home to reduce risk of falls?: Yes Life alert?: No Use of a cane, walker or w/c?: No Grab bars in the bathroom?: No Shower chair or bench in shower?: No Elevated toilet seat or a handicapped toilet?: No  TIMED UP AND GO:  Was the test performed? No    Cognitive Function:        07/26/2023   11:40 AM  6CIT Screen  What Year? 0 points  What month? 0 points  What time? 0 points  Count back  from 20 0 points  Months in reverse 0 points  Repeat phrase 0 points  Total Score 0 points    Immunizations Immunization History  Administered Date(s) Administered   Fluad Trivalent(High Dose 65+) 03/31/2023   Influenza Split 03/30/2021   Influenza, High Dose Seasonal PF 04/19/2022   Influenza, Quadrivalent, Recombinant, Inj, Pf 04/06/2019   Influenza,inj,Quad PF,6+ Mos 06/05/2014, 04/11/2017, 07/05/2018   Influenza,inj,Quad PF,6-35 Mos 03/30/2020   Influenza-Unspecified  04/16/2013, 05/01/2015, 04/09/2016   Moderna Covid-19 Fall Seasonal Vaccine 64yrs & older 03/31/2023   PFIZER Comirnaty(Gray Top)Covid-19 Tri-Sucrose Vaccine 04/19/2022   PFIZER(Purple Top)SARS-COV-2 Vaccination 09/30/2019, 10/21/2019, 05/13/2020, 02/22/2021   PNEUMOCOCCAL CONJUGATE-20 05/06/2022   Pneumococcal Polysaccharide-23 06/10/2013, 04/06/2019   Respiratory Syncytial Virus Vaccine,Recomb Aduvanted(Arexvy) 05/06/2022   Td 12/15/2001   Tdap 09/15/2014   Zoster Recombinant(Shingrix) 06/09/2019, 08/25/2019   Zoster, Live 07/04/2010    TDAP status: Up to date  Flu Vaccine status: Up to date  Pneumococcal vaccine status: Up to date  Covid-19 vaccine status: Completed vaccines  Qualifies for Shingles Vaccine? Yes   Zostavax completed Yes   Shingrix Completed?: Yes  Screening Tests Health Maintenance  Topic Date Due   DEXA SCAN  Never done   FOOT EXAM  10/16/2023   OPHTHALMOLOGY EXAM  10/18/2023   HEMOGLOBIN A1C  12/11/2023   Diabetic kidney evaluation - eGFR measurement  02/15/2024   MAMMOGRAM  02/21/2024   Diabetic kidney evaluation - Urine ACR  04/03/2024   Medicare Annual Wellness (AWV)  07/25/2024   DTaP/Tdap/Td (3 - Td or Tdap) 09/14/2024   Colonoscopy  02/10/2026   Pneumonia Vaccine 57+ Years old  Completed   INFLUENZA VACCINE  Completed   COVID-19 Vaccine  Completed   Hepatitis C Screening  Completed   Zoster Vaccines- Shingrix  Completed   HPV VACCINES  Aged Out    Health Maintenance  Health Maintenance Due  Topic Date Due   DEXA SCAN  Never done    Colorectal cancer screening: Type of screening: Colonoscopy. Completed 02/10/2021. Repeat every 5 years  Mammogram status: Completed 02/20/2022. Repeat every year  Bone Density status: Ordered 08/07/2023. Pt provided with contact info and advised to call to schedule appt.  Lung Cancer Screening: (Low Dose CT Chest recommended if Age 52-80 years, 20 pack-year currently smoking OR have quit w/in 15years.)  does not qualify.   Lung Cancer Screening Referral: N/A  Additional Screening:  Hepatitis C Screening: does qualify; Completed 07/06/2016  Vision Screening: Recommended annual ophthalmology exams for early detection of glaucoma and other disorders of the eye. Is the patient up to date with their annual eye exam?  Yes  Who is the provider or what is the name of the office in which the patient attends annual eye exams? Dr. Hutto ([facility]) If pt is not established with a provider, would they like to be referred to a provider to establish care? No .   Dental Screening: Recommended annual dental exams for proper oral hygiene  Diabetic Foot Exam: Diabetic Foot Exam: Completed 10/16/2022  Community Resource Referral / Chronic Care Management: CRR required this visit?  No   CCM required this visit?  No     Plan:     I have personally reviewed and noted the following in the patient's chart:   Medical and social history Use of alcohol, tobacco or illicit drugs  Current medications and supplements including opioid prescriptions. Patient is not currently taking opioid prescriptions. Functional ability and status Nutritional status Physical activity Advanced directives List  of other physicians Hospitalizations, surgeries, and ER visits in previous 12 months Vitals Screenings to include cognitive, depression, and falls Referrals and appointments  In addition, I have reviewed and discussed with patient certain preventive protocols, quality metrics, and best practice recommendations. A written personalized care plan for preventive services as well as general preventive health recommendations were provided to patient.     Gavynn Duvall L [place] Lipke, CMA   07/26/2023   After Visit Summary: (MyChart) Due to this being a telephonic visit, the after visit summary with patients personalized plan was offered to patient via MyChart   Nurse Notes: Patient is due for a DEXA and has an  appointment this month.  She had no concerns to address today.

## 2023-07-26 NOTE — Patient Instructions (Signed)
 [name] , Thank you for taking time to come for your Medicare Wellness Visit. I appreciate your ongoing commitment to your health goals. Please review the following plan we discussed and let me know if I can assist you in the future.   Referrals/Orders/Follow-Ups/Clinician Recommendations: It was nice talking with you today.  Keep up the good work.  This is a list of the screening recommended for you and due dates:  Health Maintenance  Topic Date Due   DEXA scan (bone density measurement)  Never done   Complete foot exam   10/16/2023   Eye exam for diabetics  10/18/2023   Hemoglobin A1C  12/11/2023   Yearly kidney function blood test for diabetes  02/15/2024   Mammogram  02/21/2024   Yearly kidney health urinalysis for diabetes  04/03/2024   Medicare Annual Wellness Visit  07/25/2024   DTaP/Tdap/Td vaccine (3 - Td or Tdap) 09/14/2024   Colon Cancer Screening  02/10/2026   Pneumonia Vaccine  Completed   Flu Shot  Completed   COVID-19 Vaccine  Completed   Hepatitis C Screening  Completed   Zoster (Shingles) Vaccine  Completed   HPV Vaccine  Aged Out    Advanced directives: (Copy Requested) Please bring a copy of your health care power of attorney and living will to the office to be added to your chart at your convenience.  Next Medicare Annual Wellness Visit scheduled for next year: Yes

## 2023-08-07 ENCOUNTER — Ambulatory Visit
Admission: RE | Admit: 2023-08-07 | Discharge: 2023-08-07 | Disposition: A | Discharge status: Home / Self Care | Payer: PPO | Source: Ambulatory Visit | Attending: Internal Medicine | Admitting: Internal Medicine

## 2023-08-07 DIAGNOSIS — E2839 Other primary ovarian failure: Secondary | ICD-10-CM

## 2023-08-07 DIAGNOSIS — N958 Other specified menopausal and perimenopausal disorders: Secondary | ICD-10-CM | POA: Diagnosis not present

## 2023-08-07 DIAGNOSIS — M8588 Other specified disorders of bone density and structure, other site: Secondary | ICD-10-CM | POA: Diagnosis not present

## 2023-08-09 ENCOUNTER — Encounter: Payer: Self-pay | Admitting: Internal Medicine

## 2023-08-09 DIAGNOSIS — M8580 Other specified disorders of bone density and structure, unspecified site: Secondary | ICD-10-CM | POA: Insufficient documentation

## 2023-09-28 ENCOUNTER — Encounter: Payer: Self-pay | Admitting: Internal Medicine

## 2023-10-08 NOTE — Progress Notes (Unsigned)
 [facility] [address] [place] [address] Phone: [phone] Subjective:   Bruce Donath, am serving as a scribe for Dr. Antoine Primas.  I'm seeing this patient by the request  of:  Pincus Sanes, MD  CC: Low back pain follow-up  VHQ:[id number]  [name] is a 68 y.o. female coming in with complaint of lumbar spine pain for past month. Last seen in May 2022. Patient states that she continues to have lower back pain. Unable to sleep well due to pain. Lumbar extension increases her pain. In the mornings every step causes sharp pain but after 15 minutes her pain subsides. Flexion relieves her pain. she will lean over her sink to brush her teeth. Pain occasionally radiates into the inside of R thigh. Hx of 2 disc surgeries in 2007.       Past Medical History:  Diagnosis Date   Allergy    Anxiety    Arthritis    Colitis    in her 70s; quiescent since   DM (diabetes mellitus) (HCC)    HTN (hypertension)    Hyperlipidemia    Past Surgical History:  Procedure Laterality Date   CERVIX SURGERY  2001   Cryosurgery, Dr Roberto Scales   COLONOSCOPY  2009   Negative, Dr. Lina Sar   FOOT SURGERY     bone spurs resected, Dr Josephina Gip- 2019   KNEE ARTHROSCOPY  1980   Right   LAMINECTOMY  2007   X 2, Dr Darrelyn Hillock   Social History   Socioeconomic History   Marital status: Divorced    Spouse name: Not on file   Number of children: 1   Years of education: Not on file   Highest education level: Master's degree (e.g., MA, MS, MEng, MEd, MSW, MBA)  Occupational History   Occupation: RETIRED    Employer: [employer]  Tobacco Use   Smoking status: Former    Current packs/day: 0.00    Types: Cigarettes    Quit date: 07/17/1977    Years since quitting: 46.2   Smokeless tobacco: Never   Tobacco comments:    Smoked in [place] Only ; 1975-1979, up to 1/2 ppd  Vaping Use   Vaping status: Never Used  Substance and Sexual Activity    Alcohol use: Never   Drug use: No   Sexual activity: Not on file  Other Topics Concern   Not on file  Social History Narrative   Lives alone with 1 dog-2025   Social Drivers of Health   Financial Resource Strain: Low Risk  (07/26/2023)   Overall Financial Resource Strain (CARDIA)    Difficulty of Paying Living Expenses: Not hard at all  Food Insecurity: No Food Insecurity (07/26/2023)   Hunger Vital Sign    Worried About Running Out of Food in the Last Year: Never true    Ran Out of Food in the Last Year: Never true  Transportation Needs: No Transportation Needs (07/26/2023)   PRAPARE - Administrator, Civil Service (Medical): No    Lack of Transportation (Non-Medical): No  Physical Activity: Sufficiently Active (07/26/2023)   Exercise Vital Sign    Days of Exercise per Week: 7 days    Minutes of Exercise per Session: 60 min  Recent Concern: Physical Activity - Insufficiently Active (06/11/2023)   Exercise Vital Sign    Days of Exercise per Week: 7 days    Minutes of Exercise per Session: 20 min  Stress:  No Stress Concern Present (07/26/2023)   [employer] of Occupational Health - Occupational Stress Questionnaire    Feeling of Stress : Not at all  Recent Concern: Stress - Stress Concern Present (06/11/2023)   [employer] of Occupational Health - Occupational Stress Questionnaire    Feeling of Stress : To some extent  Social Connections: Moderately Integrated (07/26/2023)   Social Connection and Isolation Panel [NHANES]    Frequency of Communication with Friends and Family: More than three times a week    Frequency of Social Gatherings with Friends and Family: Twice a week    Attends Religious Services: More than 4 times per year    Active Member of [employer] or Organizations: Yes    Attends Banker Meetings: Never    Marital Status: Divorced   Allergies  Allergen Reactions   Atorvastatin     REACTION: ELEVATES LFT'S. Pt states she is currently  taking Atorvastatin 40 mg (1/2 tablet daily) and does not recall hab=ving any issues with it. (07/14/14)   Tramadol Nausea And Vomiting   Codeine Nausea Only        Neomycin-Bacitracin Zn-Polymyx Rash   Onglyza [Saxagliptin Hydrochloride] Rash    This occurred after starting Kombiglyze . This was present as a rash over the hands and feet dorsally. She has been able to take metformin in the past as well as Janumet without problems.   Sulfamethoxazole-Trimethoprim Nausea And Vomiting     Nausea was extreme.     Family History  Problem Relation Age of Onset   Diabetes Father    Hypertension Father    Heart attack Father 49       precipitated by bleeding ulcer   Diabetes Mother    Hypertension Mother    Coronary artery disease Mother    Heart attack Paternal Grandfather        ? 99   Heart attack Maternal Grandfather 41   Diabetes Maternal Grandmother    Stroke Maternal Grandmother 38   Cancer Neg Hx    Colon cancer Neg Hx    Esophageal cancer Neg Hx    Rectal cancer Neg Hx    Stomach cancer Neg Hx     Current Outpatient Medications (Endocrine & Metabolic):    Dulaglutide (TRULICITY) 1.5 MG/0.5ML SOAJ, Inject 1.5 mg into the skin once a week. INJECT 1.5 MG UNDER THE SKIN ONCE WEEKLY   predniSONE (DELTASONE) 20 MG tablet, Take 2 tablets (40 mg total) by mouth daily with breakfast.  Current Outpatient Medications (Cardiovascular):    ezetimibe (ZETIA) 10 MG tablet, Take 1 tablet (10 mg total) by mouth daily.   losartan (COZAAR) 50 MG tablet, Take 50 mg by mouth daily.   metoprolol succinate (TOPROL-XL) 25 MG 24 hr tablet, TAKE ONE TABLET BY MOUTH DAILY   rosuvastatin (CRESTOR) 40 MG tablet, Take 1 tablet (40 mg total) by mouth daily.   Current Outpatient Medications (Analgesics):    aspirin 81 MG EC tablet, Take 1 tablet (81 mg total) by mouth daily. Swallow whole.   Current Outpatient Medications (Other):    Cholecalciferol (VITAMIN D3) 50 MCG (2000 UT) capsule, Take 2,000  Units by mouth daily.   Continuous Glucose Sensor (FREESTYLE LIBRE 3 PLUS SENSOR) MISC, 1 each by Does not apply route every 14 (fourteen) days.   gabapentin (NEURONTIN) 100 MG capsule, Take 1 capsule (100 mg total) by mouth at bedtime.   estradiol (ESTRACE) 0.1 MG/GM vaginal cream, Place 1 Applicatorful vaginally as directed. Takes twice monthly  Reviewed prior external information including notes and imaging from  primary care provider As well as notes that were available from care everywhere and other healthcare systems.  Past medical history, social, surgical and family history all reviewed in electronic medical record.  No pertanent information unless stated regarding to the chief complaint.   Review of Systems:  No headache, visual changes, nausea, vomiting, diarrhea, constipation, dizziness, abdominal pain, skin rash, fevers, chills, night sweats, weight loss, swollen lymph nodes, body aches, joint swelling, chest pain, shortness of breath, mood changes. POSITIVE muscle aches  Objective  Blood pressure 132/74, height 5\' 9"  (1.753 m).   General: No apparent distress alert and oriented x3 mood and affect normal, dressed appropriately.  HEENT: Pupils equal, extraocular movements intact  Respiratory: Patient's speak in full sentences and does not appear short of breath  Cardiovascular: No lower extremity edema, non tender, no erythema  Difficulty going from seated to standing.  Only can get to neutral position with extension of the back.  Causes worsening some radicular symptoms.  Severe tenderness to palpation paraspinal musculature.  Tightness with Pearlean Brownie right greater than left.    Impression and Recommendations:    The above documentation has been reviewed and is accurate and complete Judi Saa, DO

## 2023-10-09 ENCOUNTER — Encounter: Payer: Self-pay | Admitting: Family Medicine

## 2023-10-09 ENCOUNTER — Ambulatory Visit (INDEPENDENT_AMBULATORY_CARE_PROVIDER_SITE_OTHER)

## 2023-10-09 ENCOUNTER — Ambulatory Visit: Admitting: Family Medicine

## 2023-10-09 VITALS — BP 132/74 | Ht 69.0 in

## 2023-10-09 DIAGNOSIS — M545 Low back pain, unspecified: Secondary | ICD-10-CM

## 2023-10-09 DIAGNOSIS — M47816 Spondylosis without myelopathy or radiculopathy, lumbar region: Secondary | ICD-10-CM | POA: Diagnosis not present

## 2023-10-09 DIAGNOSIS — M5416 Radiculopathy, lumbar region: Secondary | ICD-10-CM | POA: Diagnosis not present

## 2023-10-09 MED ORDER — GABAPENTIN 100 MG PO CAPS: 100.0000 mg | ORAL_CAPSULE | Freq: Every day | ORAL | 0 refills | Status: DC

## 2023-10-09 MED ORDER — KETOROLAC TROMETHAMINE 30 MG/ML IJ SOLN
30.0000 mg | Freq: Once | INTRAMUSCULAR | Status: AC
Start: 2023-10-09 — End: 2023-10-09
  Administered 2023-10-09: 30 mg via INTRAMUSCULAR

## 2023-10-09 MED ORDER — METHYLPREDNISOLONE ACETATE 40 MG/ML IJ SUSP
40.0000 mg | Freq: Once | INTRAMUSCULAR | Status: AC
  Administered 2023-10-09: 40 mg via INTRAMUSCULAR

## 2023-10-09 MED ORDER — PREDNISONE 20 MG PO TABS: 40.0000 mg | ORAL_TABLET | Freq: Every day | ORAL | 0 refills | Status: DC

## 2023-10-09 NOTE — Patient Instructions (Addendum)
 Facet jt arthritis Xray today lumbar MRI lumbar [id number] Pred 40 daily for 5 days starting tmrw Gabapentin 100mg  at night Hold on ex Half cocktail today See me in 8 weeks

## 2023-10-09 NOTE — Assessment & Plan Note (Signed)
 Likely not having as much of the radicular symptoms as what she was having previously and seems to be more secondary to the facet arthritis or some spinal stenosis that is contributing.  Does have some instability noted, does even have some atrophy of the lower extremities noted today.  There must be some worsening of the symptoms at this point that is affecting daily activities.  Gabapentin encouraged, 100 mg at night given.  In addition to this short course of prednisone.  Toradol and Depo-Medrol injections given today, discussed icing regimen and home exercises, discussed which activities to do and which ones to avoid.  Do feel with the weakness noted that advanced imaging is warranted and an MRI ordered.  Depending on findings of the advanced imaging we will discuss other treatment options exist.

## 2023-10-15 ENCOUNTER — Telehealth: Payer: Self-pay | Admitting: Pharmacy Technician

## 2023-10-15 ENCOUNTER — Other Ambulatory Visit (HOSPITAL_COMMUNITY): Payer: Self-pay

## 2023-10-15 ENCOUNTER — Ambulatory Visit
Admission: RE | Admit: 2023-10-15 | Discharge: 2023-10-15 | Disposition: A | Discharge status: Home / Self Care | Source: Ambulatory Visit | Attending: Family Medicine | Admitting: Family Medicine

## 2023-10-15 DIAGNOSIS — M545 Low back pain, unspecified: Secondary | ICD-10-CM

## 2023-10-15 DIAGNOSIS — M7138 Other bursal cyst, other site: Secondary | ICD-10-CM | POA: Diagnosis not present

## 2023-10-15 NOTE — Telephone Encounter (Addendum)
 Pharmacy Patient Advocate Encounter  Received notification from [facility] [employer] that Prior Authorization for Trulicity 1.5MG /0.5ML auto-injectors  has been APPROVED from 10/15/2023 to 10/14/2024. Ran test claim, Copay is $0.00. This test claim was processed through [facility]- copay amounts may vary at other pharmacies due to pharmacy/plan contracts, or as the patient moves through the different stages of their insurance plan.   PA #/Case ID/Reference #: [id number] Key: [id number]

## 2023-10-16 ENCOUNTER — Inpatient Hospital Stay: Payer: Self-pay

## 2023-10-16 ENCOUNTER — Inpatient Hospital Stay: Payer: PPO | Attending: Oncology | Admitting: Oncology

## 2023-10-16 ENCOUNTER — Other Ambulatory Visit: Payer: PPO

## 2023-10-16 VITALS — BP 133/64 | HR 85 | Temp 98.1°F | Resp 18 | Ht 69.0 in | Wt 193.2 lb

## 2023-10-16 DIAGNOSIS — K760 Fatty (change of) liver, not elsewhere classified: Secondary | ICD-10-CM | POA: Diagnosis not present

## 2023-10-16 DIAGNOSIS — E119 Type 2 diabetes mellitus without complications: Secondary | ICD-10-CM | POA: Diagnosis not present

## 2023-10-16 DIAGNOSIS — D696 Thrombocytopenia, unspecified: Secondary | ICD-10-CM | POA: Diagnosis present

## 2023-10-16 DIAGNOSIS — D649 Anemia, unspecified: Secondary | ICD-10-CM | POA: Diagnosis not present

## 2023-10-16 DIAGNOSIS — I10 Essential (primary) hypertension: Secondary | ICD-10-CM | POA: Diagnosis not present

## 2023-10-16 DIAGNOSIS — N289 Disorder of kidney and ureter, unspecified: Secondary | ICD-10-CM | POA: Diagnosis not present

## 2023-10-16 LAB — CBC WITH DIFFERENTIAL (CANCER CENTER ONLY)
Abs Immature Granulocytes: 0.03 10*3/uL (ref 0.00–0.07)
Basophils Absolute: 0 10*3/uL (ref 0.0–0.1)
Basophils Relative: 0 %
Eosinophils Absolute: 0.2 10*3/uL (ref 0.0–0.5)
Eosinophils Relative: 1 %
HCT: 39.3 % (ref 36.0–46.0)
Hemoglobin: 12.9 g/dL (ref 12.0–15.0)
Immature Granulocytes: 0 %
Lymphocytes Relative: 26 %
Lymphs Abs: 2.8 10*3/uL (ref 0.7–4.0)
MCH: 30.7 pg (ref 26.0–34.0)
MCHC: 32.8 g/dL (ref 30.0–36.0)
MCV: 93.6 fL (ref 80.0–100.0)
Monocytes Absolute: 0.9 10*3/uL (ref 0.1–1.0)
Monocytes Relative: 8 %
Neutro Abs: 6.9 10*3/uL (ref 1.7–7.7)
Neutrophils Relative %: 65 %
Platelet Count: 89 10*3/uL — ABNORMAL LOW (ref 150–400)
RBC: 4.2 MIL/uL (ref 3.87–5.11)
RDW: 13.6 % (ref 11.5–15.5)
WBC Count: 10.8 10*3/uL — ABNORMAL HIGH (ref 4.0–10.5)
nRBC: 0 % (ref 0.0–0.2)

## 2023-10-16 NOTE — Progress Notes (Signed)
  [facility] OFFICE PROGRESS NOTE   Diagnosis: Anemia/thrombocytopenia  INTERVAL HISTORY:   Ms. Lorusso returns as scheduled.  She generally feels well.  No bleeding.  No fever or night sweats.  She has chronic low back pain with intermittent exacerbations.  She is currently being evaluated for increased low back pain.  She completed a course of prednisone yesterday.  She underwent an MRI of the lumbar spine yesterday.  She relates weight gain to a change in her medical regimen.  She has chronic mild edema of the left lower leg.  This increased when she was on prednisone and gabapentin.  Objective:  Vital signs in last 24 hours:  Blood pressure 133/64, pulse 85, temperature 98.1 F (36.7 C), temperature source Temporal, resp. rate 18, height 5\' 9"  (1.753 m), weight 193 lb 3.2 oz (87.6 kg), SpO2 100%.    Lymphatics: No cervical, supraclavicular, axillary, or inguinal nodes Resp: Lungs clear bilaterally Cardio: Regular rate and rhythm GI: No hepatosplenomegaly Vascular: Trace edema at the left greater than right ankle, the left lower leg is slightly larger than the right side.  No erythema   Lab Results:  Lab Results  Component Value Date   WBC 10.8 (H) 10/16/2023   HGB 12.9 10/16/2023   HCT 39.3 10/16/2023   MCV 93.6 10/16/2023   PLT PENDING 10/16/2023   NEUTROABS 6.9 10/16/2023    CMP  Lab Results  Component Value Date   NA 140 02/15/2023   K 4.4 02/15/2023   CL 108 02/15/2023   CO2 23 02/15/2023   GLUCOSE 107 (H) 02/15/2023   BUN 25 (H) 02/15/2023   CREATININE 1.39 (H) 02/15/2023   CALCIUM 9.8 02/15/2023   PROT 7.8 11/07/2022   ALBUMIN 4.0 11/07/2022   AST 70 (H) 11/07/2022   ALT 42 (H) 11/07/2022   ALKPHOS 115 11/07/2022   BILITOT 1.0 11/07/2022   GFRNONAA 42 (L) 02/15/2023   GFRAA 85 04/22/2008    No results found for: "CEA1", "CEA", "VZD638", "CA125"  Lab Results  Component Value Date   INR 1.2 (H) 08/10/2021   LABPROT 12.6 08/10/2021     Imaging:  No results found.  Medications: I have reviewed the patient's current medications.   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  She will call for spontaneous bleeding or bruising.  She will continue follow-up with Dr. Lawerance Bach and sports medicine for evaluation of back pain.  She will alert treating physicians of the low platelet count. Mild elevation of the white count today is likely related to recent prednisone therapy. She will return for an office visit and CBC in 8 months.  Thornton Papas, MD  10/16/2023  3:29 PM

## 2023-10-17 ENCOUNTER — Telehealth: Payer: Self-pay | Admitting: Oncology

## 2023-10-17 NOTE — Telephone Encounter (Addendum)
 Contacted pt to schedule an appt per 10/16/23 LOS. Unable to reach via phone.

## 2023-10-18 ENCOUNTER — Encounter: Payer: Self-pay | Admitting: Family Medicine

## 2023-10-19 ENCOUNTER — Encounter: Payer: Self-pay | Admitting: Family Medicine

## 2023-10-19 NOTE — Telephone Encounter (Signed)
 Appt scheduled, lvm notifying pt of date and time.

## 2023-10-22 ENCOUNTER — Encounter: Payer: Self-pay | Admitting: Family Medicine

## 2023-10-22 ENCOUNTER — Other Ambulatory Visit: Payer: Self-pay

## 2023-10-22 DIAGNOSIS — M48061 Spinal stenosis, lumbar region without neurogenic claudication: Secondary | ICD-10-CM

## 2023-11-01 ENCOUNTER — Other Ambulatory Visit: Payer: Self-pay | Admitting: Internal Medicine

## 2023-11-04 ENCOUNTER — Other Ambulatory Visit: Payer: Self-pay | Admitting: Internal Medicine

## 2023-11-05 MED ORDER — EZETIMIBE 10 MG PO TABS: 10.0000 mg | ORAL_TABLET | Freq: Every day | ORAL | 0 refills | Status: DC

## 2023-11-07 DIAGNOSIS — Z6827 Body mass index (BMI) 27.0-27.9, adult: Secondary | ICD-10-CM | POA: Diagnosis not present

## 2023-11-07 DIAGNOSIS — M48061 Spinal stenosis, lumbar region without neurogenic claudication: Secondary | ICD-10-CM | POA: Diagnosis not present

## 2023-11-12 DIAGNOSIS — I129 Hypertensive chronic kidney disease with stage 1 through stage 4 chronic kidney disease, or unspecified chronic kidney disease: Secondary | ICD-10-CM | POA: Diagnosis not present

## 2023-11-12 DIAGNOSIS — D631 Anemia in chronic kidney disease: Secondary | ICD-10-CM | POA: Diagnosis not present

## 2023-11-12 DIAGNOSIS — E1122 Type 2 diabetes mellitus with diabetic chronic kidney disease: Secondary | ICD-10-CM | POA: Diagnosis not present

## 2023-11-12 DIAGNOSIS — N1831 Chronic kidney disease, stage 3a: Secondary | ICD-10-CM | POA: Diagnosis not present

## 2023-11-13 LAB — LAB REPORT - SCANNED
Albumin, Urine POC: 13.6
Albumin/Creatinine Ratio, Urine, POC: 17
Creatinine, POC: 81.9 mg/dL
EGFR: 40

## 2023-11-15 ENCOUNTER — Ambulatory Visit: Admitting: Family Medicine

## 2023-11-22 DIAGNOSIS — M5416 Radiculopathy, lumbar region: Secondary | ICD-10-CM | POA: Diagnosis not present

## 2023-12-02 ENCOUNTER — Other Ambulatory Visit: Payer: Self-pay | Admitting: Internal Medicine

## 2023-12-02 DIAGNOSIS — E7849 Other hyperlipidemia: Secondary | ICD-10-CM

## 2023-12-03 DIAGNOSIS — M48061 Spinal stenosis, lumbar region without neurogenic claudication: Secondary | ICD-10-CM | POA: Diagnosis not present

## 2023-12-04 ENCOUNTER — Ambulatory Visit: Admitting: Family Medicine

## 2023-12-05 DIAGNOSIS — M48061 Spinal stenosis, lumbar region without neurogenic claudication: Secondary | ICD-10-CM | POA: Diagnosis not present

## 2023-12-12 ENCOUNTER — Encounter: Payer: Self-pay | Admitting: Internal Medicine

## 2023-12-12 DIAGNOSIS — R7989 Other specified abnormal findings of blood chemistry: Secondary | ICD-10-CM | POA: Insufficient documentation

## 2023-12-12 NOTE — Progress Notes (Unsigned)
 Subjective:    Patient ID: [name], female    DOB: 08-26-1955, 68 y.o.   MRN: [id number]      HPI [name] is here for a Physical exam and her chronic medical problems.   Herniated disc in back - has had a lot of pain.  She saw Dr Adonis Alamin.  She may need a spinal fusion.  Pain is worse in the morning and at night.  Nothing seems to help the pain.    She is frustrated because she has gained some of the weight she has lost.  Some is related to taking gabapentin , steroids.   Medications and allergies reviewed with patient and updated if appropriate.  Current Outpatient Medications on File Prior to Visit  Medication Sig Dispense Refill   aspirin  81 MG EC tablet Take 1 tablet (81 mg total) by mouth daily. Swallow whole. 30 tablet 12   Cholecalciferol (VITAMIN D3) 50 MCG (2000 UT) capsule Take 2,000 Units by mouth daily.     Continuous Glucose Sensor (FREESTYLE LIBRE 3 PLUS SENSOR) MISC 1 each by Does not apply route every 14 (fourteen) days. 6 each 3   Dulaglutide  (TRULICITY ) 1.5 MG/0.5ML SOAJ Inject 1.5 mg into the skin once a week. INJECT 1.5 MG UNDER THE SKIN ONCE WEEKLY 6 mL 3   ezetimibe  (ZETIA ) 10 MG tablet Take 1 tablet (10 mg total) by mouth daily. 90 tablet 0   losartan (COZAAR) 50 MG tablet Take 50 mg by mouth daily.     metoprolol  succinate (TOPROL -XL) 25 MG 24 hr tablet TAKE ONE TABLET BY MOUTH DAILY 90 tablet 3   rosuvastatin  (CRESTOR ) 40 MG tablet TAKE 1 TABLET BY MOUTH DAILY 30 tablet 2   No current facility-administered medications on file prior to visit.    Review of Systems  Constitutional:  Negative for fever.  Eyes:  Negative for visual disturbance.  Respiratory:  Negative for cough, shortness of breath and wheezing.   Cardiovascular:  Positive for palpitations (occ). Negative for chest pain and leg swelling.  Gastrointestinal:  Negative for abdominal pain, blood in stool, constipation and diarrhea.       No gerd  Genitourinary:  Negative for dysuria.   Musculoskeletal:  Positive for back pain and joint swelling (left ankle - chronic - stable). Negative for arthralgias.  Skin:  Negative for rash.  Neurological:  Negative for light-headedness, numbness and headaches.  Psychiatric/Behavioral:  Negative for dysphoric mood. The patient is nervous/anxious.        Objective:   Vitals:   12/13/23 0909  BP: 130/72  Pulse: 75  Temp: 98.3 F (36.8 C)  SpO2: 96%   Filed Weights   12/13/23 0909  Weight: 188 lb (85.3 kg)   Body mass index is 27.76 kg/m.  BP Readings from Last 3 Encounters:  12/13/23 130/72  10/16/23 133/64  10/09/23 132/74    Wt Readings from Last 3 Encounters:  12/13/23 188 lb (85.3 kg)  10/16/23 193 lb 3.2 oz (87.6 kg)  07/26/23 173 lb (78.5 kg)       Physical Exam Constitutional: She appears well-developed and well-nourished. No distress.  HENT:  Head: Normocephalic and atraumatic.  Right Ear: External ear normal. Normal ear canal and TM Left Ear: External ear normal.  Normal ear canal and TM Mouth/Throat: Oropharynx is clear and moist.  Eyes: Conjunctivae normal.  Neck: Neck supple. No tracheal deviation present. No thyromegaly present.  No carotid bruit  Cardiovascular: Normal rate, regular rhythm and normal heart sounds.  No murmur heard.  No edema. Pulmonary/Chest: Effort normal and breath sounds normal. No respiratory distress. She has no wheezes. She has no rales.  Breast: deferred   Abdominal: Soft. She exhibits no distension. There is no tenderness.  Lymphadenopathy: She has no cervical adenopathy.  Skin: Skin is warm and dry. She is not diaphoretic.  Psychiatric: She has a normal mood and affect. Her behavior is normal.     Lab Results  Component Value Date   WBC 10.8 (H) 10/16/2023   HGB 12.9 10/16/2023   HCT 39.3 10/16/2023   PLT 89 (L) 10/16/2023   GLUCOSE 107 (H) 02/15/2023   CHOL 150 11/07/2022   TRIG 54.0 11/07/2022   HDL 65.70 11/07/2022   LDLDIRECT 149.9 02/14/2010    LDLCALC 74 11/07/2022   ALT 42 (H) 11/07/2022   AST 70 (H) 11/07/2022   NA 140 02/15/2023   K 4.4 02/15/2023   CL 108 02/15/2023   CREATININE 1.39 (H) 02/15/2023   BUN 25 (H) 02/15/2023   CO2 23 02/15/2023   TSH 1.04 11/07/2022   INR 1.2 (H) 08/10/2021   HGBA1C 5.4 06/13/2023   MICROALBUR 23.6 (H) 10/31/2021         Assessment & Plan:   Physical exam: Screening blood work  ordered Exercise  - doing PT, limited by back pain Weight  is ok - has gained some weight Substance abuse  none   Reviewed recommended immunizations.   Health Maintenance  Topic Date Due   FOOT EXAM  10/16/2023   OPHTHALMOLOGY EXAM  10/18/2023   HEMOGLOBIN A1C  12/11/2023   COVID-19 Vaccine (7 - 2024-25 season) 12/29/2023 (Originally 09/28/2023)   INFLUENZA VACCINE  02/15/2024   MAMMOGRAM  02/21/2024   Medicare Annual Wellness (AWV)  07/25/2024   DTaP/Tdap/Td (3 - Td or Tdap) 09/14/2024   Diabetic kidney evaluation - eGFR measurement  11/12/2024   Diabetic kidney evaluation - Urine ACR  11/12/2024   DEXA SCAN  08/06/2025   Colonoscopy  02/10/2026   Pneumonia Vaccine 33+ Years old  Completed   Hepatitis C Screening  Completed   Zoster Vaccines- Shingrix  Completed   HPV VACCINES  Aged Out   Meningococcal B Vaccine  Aged Out          See Problem List for Assessment and Plan of chronic medical problems.

## 2023-12-13 ENCOUNTER — Ambulatory Visit: Payer: PPO | Admitting: Internal Medicine

## 2023-12-13 ENCOUNTER — Ambulatory Visit: Payer: Self-pay | Admitting: Internal Medicine

## 2023-12-13 VITALS — BP 130/72 | HR 75 | Temp 98.3°F | Ht 69.0 in | Wt 188.0 lb

## 2023-12-13 DIAGNOSIS — I10 Essential (primary) hypertension: Secondary | ICD-10-CM

## 2023-12-13 DIAGNOSIS — N1832 Chronic kidney disease, stage 3b: Secondary | ICD-10-CM

## 2023-12-13 DIAGNOSIS — Z0000 Encounter for general adult medical examination without abnormal findings: Secondary | ICD-10-CM | POA: Diagnosis not present

## 2023-12-13 DIAGNOSIS — R7989 Other specified abnormal findings of blood chemistry: Secondary | ICD-10-CM | POA: Diagnosis not present

## 2023-12-13 DIAGNOSIS — E1151 Type 2 diabetes mellitus with diabetic peripheral angiopathy without gangrene: Secondary | ICD-10-CM | POA: Diagnosis not present

## 2023-12-13 DIAGNOSIS — D696 Thrombocytopenia, unspecified: Secondary | ICD-10-CM | POA: Diagnosis not present

## 2023-12-13 DIAGNOSIS — K760 Fatty (change of) liver, not elsewhere classified: Secondary | ICD-10-CM

## 2023-12-13 DIAGNOSIS — M85851 Other specified disorders of bone density and structure, right thigh: Secondary | ICD-10-CM | POA: Diagnosis not present

## 2023-12-13 DIAGNOSIS — E7849 Other hyperlipidemia: Secondary | ICD-10-CM

## 2023-12-13 LAB — LIPID PANEL
Cholesterol: 154 mg/dL (ref 0–200)
HDL: 66.9 mg/dL (ref >39.00)
LDL Cholesterol: 71 mg/dL (ref 0–99)
NonHDL: 86.65
Total CHOL/HDL Ratio: 2
Triglycerides: 77 mg/dL (ref 0.0–149.0)
VLDL: 15.4 mg/dL (ref 0.0–40.0)

## 2023-12-13 LAB — CBC WITH DIFFERENTIAL/PLATELET
Basophils Absolute: 0 10*3/uL (ref 0.0–0.1)
Basophils Relative: 0.9 % (ref 0.0–3.0)
Eosinophils Absolute: 0.3 10*3/uL (ref 0.0–0.7)
Eosinophils Relative: 6.5 % — ABNORMAL HIGH (ref 0.0–5.0)
HCT: 40.1 % (ref 36.0–46.0)
Hemoglobin: 13.2 g/dL (ref 12.0–15.0)
Lymphocytes Relative: 27.1 % (ref 12.0–46.0)
Lymphs Abs: 1.2 10*3/uL (ref 0.7–4.0)
MCHC: 33 g/dL (ref 30.0–36.0)
MCV: 94.9 fl (ref 78.0–100.0)
Monocytes Absolute: 0.3 10*3/uL (ref 0.1–1.0)
Monocytes Relative: 7.1 % (ref 3.0–12.0)
Neutro Abs: 2.5 10*3/uL (ref 1.4–7.7)
Neutrophils Relative %: 58.4 % (ref 43.0–77.0)
Platelets: 80 10*3/uL — ABNORMAL LOW (ref 150.0–400.0)
RBC: 4.22 Mil/uL (ref 3.87–5.11)
RDW: 14.2 % (ref 11.5–15.5)
WBC: 4.3 10*3/uL (ref 4.0–10.5)

## 2023-12-13 LAB — COMPREHENSIVE METABOLIC PANEL WITH GFR
ALT: 41 U/L — ABNORMAL HIGH (ref 0–35)
AST: 57 U/L — ABNORMAL HIGH (ref 0–37)
Albumin: 3.8 g/dL (ref 3.5–5.2)
Alkaline Phosphatase: 106 U/L (ref 39–117)
BUN: 26 mg/dL — ABNORMAL HIGH (ref 6–23)
CO2: 23 meq/L (ref 19–32)
Calcium: 9.5 mg/dL (ref 8.4–10.5)
Chloride: 109 meq/L (ref 96–112)
Creatinine, Ser: 1.42 mg/dL — ABNORMAL HIGH (ref 0.40–1.20)
GFR: 38.15 mL/min — ABNORMAL LOW (ref >60.00)
Glucose, Bld: 124 mg/dL — ABNORMAL HIGH (ref 70–99)
Potassium: 4.2 meq/L (ref 3.5–5.1)
Sodium: 140 meq/L (ref 135–145)
Total Bilirubin: 1.3 mg/dL — ABNORMAL HIGH (ref 0.2–1.2)
Total Protein: 7.1 g/dL (ref 6.0–8.3)

## 2023-12-13 LAB — VITAMIN D 25 HYDROXY (VIT D DEFICIENCY, FRACTURES): VITD: 55.16 ng/mL (ref 30.00–100.00)

## 2023-12-13 LAB — VITAMIN B12: Vitamin B-12: 553 pg/mL (ref 211–911)

## 2023-12-13 LAB — TSH: TSH: 1.03 u[IU]/mL (ref 0.35–5.50)

## 2023-12-13 NOTE — Assessment & Plan Note (Signed)
 Chronic Stable Blood pressure well-controlled, sugars well-controlled Continue healthy diet, regular exercise and weight loss efforts Following with nephrology On losartan 50 mg daily

## 2023-12-13 NOTE — Assessment & Plan Note (Addendum)
 Chronic Blood pressure well controlled CBC, CMP Continue metoprolol  XL 25 mg daily, losartan 50 mg daily

## 2023-12-13 NOTE — Assessment & Plan Note (Signed)
 Chronic DEXA done earlier this year Stressed regular exercise Taking vitamin D  daily High calcium  diet Check vitamin D  level

## 2023-12-13 NOTE — Assessment & Plan Note (Signed)
 Chronic Regular exercise and healthy diet encouraged LDL has been well-controlled Check lipid panel, TSH, CMP Continue Zetia  10 mg daily, rosuvastatin  40 mg daily

## 2023-12-13 NOTE — Assessment & Plan Note (Signed)
Chronic ?Check B12 level ?

## 2023-12-13 NOTE — Assessment & Plan Note (Signed)
 Chronic She has lost weight She is eating a healthy diet CMP

## 2023-12-13 NOTE — Assessment & Plan Note (Signed)
 Chronic Management per Dr. Aldona Amel Lab Results  Component Value Date   HGBA1C 5.4 06/13/2023   Sugars well-controlled Currently on Trulicity  1.5 mg weekly

## 2023-12-13 NOTE — Patient Instructions (Addendum)
 Blood work was ordered.       Medications changes include :   None     Return in about 6 months (around 06/14/2024) for follow up.    Health Maintenance, Female Adopting a healthy lifestyle and getting preventive care are important in promoting health and wellness. Ask your health care provider about: The right schedule for you to have regular tests and exams. Things you can do on your own to prevent diseases and keep yourself healthy. What should I know about diet, weight, and exercise? Eat a healthy diet  Eat a diet that includes plenty of vegetables, fruits, low-fat dairy products, and lean protein. Do not eat a lot of foods that are high in solid fats, added sugars, or sodium. Maintain a healthy weight Body mass index (BMI) is used to identify weight problems. It estimates body fat based on height and weight. Your health care provider can help determine your BMI and help you achieve or maintain a healthy weight. Get regular exercise Get regular exercise. This is one of the most important things you can do for your health. Most adults should: Exercise for at least 150 minutes each week. The exercise should increase your heart rate and make you sweat (moderate-intensity exercise). Do strengthening exercises at least twice a week. This is in addition to the moderate-intensity exercise. Spend less time sitting. Even light physical activity can be beneficial. Watch cholesterol and blood lipids Have your blood tested for lipids and cholesterol at 68 years of age, then have this test every 5 years. Have your cholesterol levels checked more often if: Your lipid or cholesterol levels are high. You are older than 68 years of age. You are at high risk for heart disease. What should I know about cancer screening? Depending on your health history and family history, you may need to have cancer screening at various ages. This may include screening for: Breast cancer. Cervical  cancer. Colorectal cancer. Skin cancer. Lung cancer. What should I know about heart disease, diabetes, and high blood pressure? Blood pressure and heart disease High blood pressure causes heart disease and increases the risk of stroke. This is more likely to develop in people who have high blood pressure readings or are overweight. Have your blood pressure checked: Every 3-5 years if you are 21-23 years of age. Every year if you are 87 years old or older. Diabetes Have regular diabetes screenings. This checks your fasting blood sugar level. Have the screening done: Once every three years after age 72 if you are at a normal weight and have a low risk for diabetes. More often and at a younger age if you are overweight or have a high risk for diabetes. What should I know about preventing infection? Hepatitis B If you have a higher risk for hepatitis B, you should be screened for this virus. Talk with your health care provider to find out if you are at risk for hepatitis B infection. Hepatitis C Testing is recommended for: Everyone born from [birth year] through 1965. Anyone with known risk factors for hepatitis C. Sexually transmitted infections (STIs) Get screened for STIs, including gonorrhea and chlamydia, if: You are sexually active and are younger than 68 years of age. You are older than 68 years of age and your health care provider tells you that you are at risk for this type of infection. Your sexual activity has changed since you were last screened, and you are at increased risk for chlamydia or  gonorrhea. Ask your health care provider if you are at risk. Ask your health care provider about whether you are at high risk for HIV. Your health care provider may recommend a prescription medicine to help prevent HIV infection. If you choose to take medicine to prevent HIV, you should first get tested for HIV. You should then be tested every 3 months for as long as you are taking the  medicine. Pregnancy If you are about to stop having your period (premenopausal) and you may become pregnant, seek counseling before you get pregnant. Take 400 to 800 micrograms (mcg) of folic acid every day if you become pregnant. Ask for birth control (contraception) if you want to prevent pregnancy. Osteoporosis and menopause Osteoporosis is a disease in which the bones lose minerals and strength with aging. This can result in bone fractures. If you are 92 years old or older, or if you are at risk for osteoporosis and fractures, ask your health care provider if you should: Be screened for bone loss. Take a calcium  or vitamin D  supplement to lower your risk of fractures. Be given hormone replacement therapy (HRT) to treat symptoms of menopause. Follow these instructions at home: Alcohol use Do not drink alcohol if: Your health care provider tells you not to drink. You are pregnant, may be pregnant, or are planning to become pregnant. If you drink alcohol: Limit how much you have to: 0-1 drink a day. Know how much alcohol is in your drink. In the [place] one drink equals one 12 oz bottle of beer (355 mL), one 5 oz glass of wine (148 mL), or one 1 oz glass of hard liquor (44 mL). Lifestyle Do not use any products that contain nicotine or tobacco. These products include cigarettes, chewing tobacco, and vaping devices, such as e-cigarettes. If you need help quitting, ask your health care provider. Do not use street drugs. Do not share needles. Ask your health care provider for help if you need support or information about quitting drugs. General instructions Schedule regular health, dental, and eye exams. Stay current with your vaccines. Tell your health care provider if: You often feel depressed. You have ever been abused or do not feel safe at home. Summary Adopting a healthy lifestyle and getting preventive care are important in promoting health and wellness. Follow your health care  provider's instructions about healthy diet, exercising, and getting tested or screened for diseases. Follow your health care provider's instructions on monitoring your cholesterol and blood pressure. This information is not intended to replace advice given to you by your health care provider. Make sure you discuss any questions you have with your health care provider. Document Revised: 11/22/2020 Document Reviewed: 11/22/2020 Elsevier Patient Education  2024 [employer].

## 2023-12-13 NOTE — Assessment & Plan Note (Signed)
 Chronic Stable CBC Following with hematology/oncology

## 2023-12-17 DIAGNOSIS — M48061 Spinal stenosis, lumbar region without neurogenic claudication: Secondary | ICD-10-CM | POA: Diagnosis not present

## 2023-12-19 DIAGNOSIS — H2513 Age-related nuclear cataract, bilateral: Secondary | ICD-10-CM | POA: Diagnosis not present

## 2023-12-19 DIAGNOSIS — E119 Type 2 diabetes mellitus without complications: Secondary | ICD-10-CM | POA: Diagnosis not present

## 2023-12-19 LAB — HM DIABETES EYE EXAM

## 2023-12-20 DIAGNOSIS — M48061 Spinal stenosis, lumbar region without neurogenic claudication: Secondary | ICD-10-CM | POA: Diagnosis not present

## 2023-12-21 ENCOUNTER — Ambulatory Visit: Payer: PPO | Admitting: Internal Medicine

## 2023-12-21 DIAGNOSIS — Z1231 Encounter for screening mammogram for malignant neoplasm of breast: Secondary | ICD-10-CM | POA: Diagnosis not present

## 2023-12-21 LAB — HM MAMMOGRAPHY

## 2023-12-24 ENCOUNTER — Encounter: Payer: Self-pay | Admitting: Internal Medicine

## 2023-12-24 DIAGNOSIS — M48061 Spinal stenosis, lumbar region without neurogenic claudication: Secondary | ICD-10-CM | POA: Diagnosis not present

## 2023-12-26 ENCOUNTER — Encounter: Payer: Self-pay | Admitting: Internal Medicine

## 2023-12-26 DIAGNOSIS — M48061 Spinal stenosis, lumbar region without neurogenic claudication: Secondary | ICD-10-CM | POA: Diagnosis not present

## 2023-12-26 MED ORDER — SEMAGLUTIDE(0.25 OR 0.5MG/DOS) 2 MG/3ML ~~LOC~~ SOPN: 0.5000 mg | PEN_INJECTOR | SUBCUTANEOUS | 0 refills | Status: DC

## 2023-12-27 ENCOUNTER — Other Ambulatory Visit (HOSPITAL_COMMUNITY): Payer: Self-pay

## 2023-12-27 ENCOUNTER — Telehealth: Payer: Self-pay | Admitting: Pharmacy Technician

## 2023-12-27 NOTE — Telephone Encounter (Signed)
 Pharmacy Patient Advocate Encounter   Received notification from CoverMyMeds that prior authorization for Ozempic (0.25 or 0.5 MG/DOSE) 2MG /3ML pen-injectors is required/requested.   Insurance verification completed.   The patient is insured through [facility] ADVANTAGE/RX ADVANCE .   Per test claim: PA required; PA submitted to above mentioned insurance via CoverMyMeds Key/confirmation #/EOC [id number] Status is pending

## 2023-12-27 NOTE — Telephone Encounter (Signed)
 Pharmacy Patient Advocate Encounter  Received notification from [facility] ADVANTAGE/RX ADVANCE that Prior Authorization for Ozempic (0.25 or 0.5 MG/DOSE) 2MG /3ML pen-injectors has been APPROVED from 12/27/2023 to 12/26/2024. Ran test claim, Copay is $0.00. This test claim was processed through [facility]- copay amounts may vary at other pharmacies due to pharmacy/plan contracts, or as the patient moves through the different stages of their insurance plan.   PA #/Case ID/Reference #: [id number]

## 2024-01-01 ENCOUNTER — Other Ambulatory Visit: Payer: Self-pay | Admitting: Family Medicine

## 2024-01-02 DIAGNOSIS — M48061 Spinal stenosis, lumbar region without neurogenic claudication: Secondary | ICD-10-CM | POA: Diagnosis not present

## 2024-01-10 DIAGNOSIS — M48061 Spinal stenosis, lumbar region without neurogenic claudication: Secondary | ICD-10-CM | POA: Diagnosis not present

## 2024-01-17 ENCOUNTER — Ambulatory Visit: Admitting: Internal Medicine

## 2024-01-17 ENCOUNTER — Encounter: Payer: Self-pay | Admitting: Internal Medicine

## 2024-01-17 VITALS — BP 122/70 | HR 86 | Ht 69.0 in | Wt 187.8 lb

## 2024-01-17 DIAGNOSIS — E7849 Other hyperlipidemia: Secondary | ICD-10-CM

## 2024-01-17 DIAGNOSIS — Z7985 Long-term (current) use of injectable non-insulin antidiabetic drugs: Secondary | ICD-10-CM | POA: Diagnosis not present

## 2024-01-17 DIAGNOSIS — E1151 Type 2 diabetes mellitus with diabetic peripheral angiopathy without gangrene: Secondary | ICD-10-CM

## 2024-01-17 LAB — POCT GLYCOSYLATED HEMOGLOBIN (HGB A1C): Hemoglobin A1C: 5.9 % — AB (ref 4.0–5.6)

## 2024-01-17 MED ORDER — SEMAGLUTIDE(0.25 OR 0.5MG/DOS) 2 MG/3ML ~~LOC~~ SOPN: 0.5000 mg | PEN_INJECTOR | SUBCUTANEOUS | 3 refills | Status: AC

## 2024-01-17 NOTE — Patient Instructions (Signed)
 Please continue: - Ozempic  0.5 mg weekly   Please return in 6 months.

## 2024-01-17 NOTE — Progress Notes (Signed)
 Patient ID: [name], female   DOB: 1956/01/06, 68 y.o.   MRN: [id number]  HPI: [name] is a pleasant 68 y.o.-year-old female, presenting for follow-up for DM2, dx in 2000, non-insulin -dependent, uncontrolled, with complications (PVD, CKD, DR).  Last visit 7 months ago.  Interim history: No increased urination, blurry vision, nausea, chest pain.  She has spinal stenosis and a herniated disk (had neurontin  and steroids) and sees Dr. Louis. She gained weight and sugars were higher. Now off and sugars started to improve. She is in PT.  Reviewed HbA1c levels: Lab Results  Component Value Date   HGBA1C 5.4 06/13/2023   HGBA1C 5.7 (A) 07/24/2022   HGBA1C 5.2 01/19/2022  01/13/2021: HbA1c 6.1%  Pt is on a regimen of: - Ozempic  0.5 mg weekly - started 3 weeks ago. She was on Farxiga  10 mg daily in am >> stopped 08/2017 after mixup at the pharmacy. She also had a rash on hands. She was on Victoza  -  insurance stopped covering it. She was on Kombiglyze = Metformin  + Onglyza (Saxagliptin ) - 1000-2.5 mg daily >> rash. She was on metformin  previously, stopped 05/2023. She was on glipizide  before dinner in the past. We had her on Trulicity  1.5 mg weekly before changing to Ozempic .  Pt checks her sugars more than 4 times a day with her freestyle libre 3 CGM:  Previously:  Lowest sugar was: 74 >> 61 >> 89 >> 60 (after cereals + banana, followed by increased activity) >> 53 >> 90s; she has  hypoglycemia awareness in the 80. Highest sugar was: 214 >> 133 >> 132 >> 140 >> 225 >> 350 (steroids).  Glucometer: One Touch Ultra  -+ CKD, last BUN/creatinine:  Lab Results  Component Value Date   BUN 26 (H) 12/13/2023   CREATININE 1.42 (H) 12/13/2023   Lab Results  Component Value Date   GFRAA 85 04/22/2008   GFRAA 85 11/22/2006   Lab Results  Component Value Date   MICRALBCREAT 17 11/13/2023   MICRALBCREAT 64.0 04/04/2023   MICRALBCREAT 32 09/29/2022   MICRALBCREAT 0.4 02/14/2010    MICRALBCREAT 5.1 04/22/2008   MICRALBCREAT 3.0 11/22/2006  On benazepril  prev., now Losartan increased to 50 mg daily.  -+ HL. Last set of lipids: Lab Results  Component Value Date   CHOL 154 12/13/2023   HDL 66.90 12/13/2023   LDLCALC 71 12/13/2023   LDLDIRECT 149.9 02/14/2010   TRIG 77.0 12/13/2023   CHOLHDL 2 12/13/2023  On Crestor  40 and Zetia  10.  - last eye exam was 12/19/2023: No DR; she has a history of hemorrhage in OS.  -+ Numbness and tingling in her feet developed after spine surgery.  Last foot exam 10/16/2022 [name]).  She also has HTN, Had back surgery 2007. Back pain increased 02/2015 >> saw Dr Claudene >> started Gabapentin  >> disequilibrium. She was on steroids then >> sugars higher. In 03/2015: spine steroid inj.  ROS: + see HPI  I reviewed pt's medications, allergies, PMH, social hx, family hx, and changes were documented in the history of present illness. Otherwise, unchanged from my initial visit note.  Past Medical History:  Diagnosis Date   Allergy    Anxiety    Arthritis    Colitis    in her 68s; quiescent since   DM (diabetes mellitus) (HCC)    HTN (hypertension)    Hyperlipidemia    Past Surgical History:  Procedure Laterality Date   CERVIX SURGERY  2001   Cryosurgery, Dr Elsa  COLONOSCOPY  2009   Negative, Dr. Princella Nida   FOOT SURGERY     bone spurs resected, Dr Celia- 2019   KNEE ARTHROSCOPY  1980   Right   LAMINECTOMY  2007   X 2, Dr Heide   Social History   Social History   Marital Status: Divorced    Spouse Name: N/A   Number of Children: 1   Occupational History   Interior and spatial designer of [employer]   Social History Main Topics   Smoking status: Former Smoker    Quit date: 1996   Alcohol Use: Yes     Comment:  rarely   Drug Use: No   Current Outpatient Medications on File Prior to Visit  Medication Sig Dispense Refill   aspirin  81 MG EC tablet Take 1 tablet (81 mg total) by mouth daily. Swallow whole. 30 tablet 12    Cholecalciferol (VITAMIN D3) 50 MCG (2000 UT) capsule Take 2,000 Units by mouth daily.     Continuous Glucose Sensor (FREESTYLE LIBRE 3 PLUS SENSOR) MISC 1 each by Does not apply route every 14 (fourteen) days. 6 each 3   ezetimibe  (ZETIA ) 10 MG tablet Take 1 tablet (10 mg total) by mouth daily. 90 tablet 0   gabapentin  (NEURONTIN ) 100 MG capsule TAKE 1 CAPSULE BY MOUTH AT BEDTIME 90 capsule 0   losartan (COZAAR) 50 MG tablet Take 50 mg by mouth daily.     metoprolol  succinate (TOPROL -XL) 25 MG 24 hr tablet TAKE ONE TABLET BY MOUTH DAILY 90 tablet 3   rosuvastatin  (CRESTOR ) 40 MG tablet TAKE 1 TABLET BY MOUTH DAILY 30 tablet 2   Semaglutide ,0.25 or 0.5MG /DOS, 2 MG/3ML SOPN Inject 0.5 mg into the skin once a week. 3 mL 0   No current facility-administered medications on file prior to visit.   Allergies  Allergen Reactions   Atorvastatin      REACTION: ELEVATES LFT'S. Pt states she is currently taking Atorvastatin  40 mg (1/2 tablet daily) and does not recall hab=ving any issues with it. (07/14/14)   Tramadol  Nausea And Vomiting   Codeine Nausea Only        Neomycin-Bacitracin Zn-Polymyx Rash   Onglyza [Saxagliptin  Hydrochloride] Rash    This occurred after starting Kombiglyze . This was present as a rash over the hands and feet dorsally. She has been able to take metformin  in the past as well as Janumet  without problems.   Sulfamethoxazole-Trimethoprim Nausea And Vomiting     Nausea was extreme.     Family History  Problem Relation Age of Onset   Diabetes Father    Hypertension Father    Heart attack Father 37       precipitated by bleeding ulcer   Diabetes Mother    Hypertension Mother    Coronary artery disease Mother    Heart attack Paternal Grandfather        ? 40   Heart attack Maternal Grandfather 24   Diabetes Maternal Grandmother    Stroke Maternal Grandmother 64   Cancer Neg Hx    Colon cancer Neg Hx    Esophageal cancer Neg Hx    Rectal cancer Neg Hx    Stomach  cancer Neg Hx    PE: BP 122/70   Pulse 86   Ht 5' 9 (1.753 m)   Wt 187 lb 12.8 oz (85.2 kg)   SpO2 97%   BMI 27.73 kg/m    Wt Readings from Last 10 Encounters:  01/17/24 187 lb 12.8 oz (85.2  kg)  12/13/23 188 lb (85.3 kg)  10/16/23 193 lb 3.2 oz (87.6 kg)  07/26/23 173 lb (78.5 kg)  06/13/23 173 lb 12.8 oz (78.8 kg)  06/11/23 173 lb (78.5 kg)  02/15/23 180 lb (81.6 kg)  01/22/23 167 lb 11.2 oz (76.1 kg)  11/07/22 171 lb (77.6 kg)  10/09/22 172 lb (78 kg)   Constitutional: overweight, in NAD Eyes:  EOMI, no exophthalmos ENT: no neck masses, no cervical lymphadenopathy Cardiovascular: RRR, No MRG Respiratory: CTA B Musculoskeletal: no deformities Skin:no rashes Neurological: no tremor with outstretched hands Diabetic Foot Exam - Simple   Simple Foot Form Diabetic Foot exam was performed with the following findings: Yes 01/17/2024  8:45 AM  Visual Inspection No deformities, no ulcerations, no other skin breakdown bilaterally: Yes Sensation Testing Intact to touch and monofilament testing bilaterally: Yes Pulse Check Posterior Tibialis and Dorsalis pulse intact bilaterally: Yes Comments + L foot pitting edema    ASSESSMENT: 1. DM2, non-insulin -dependent, uncontrolled, with complications - PVD - CKD - DR  2. HL  3.  Overweight  PLAN:  1. Patient with previously uncontrolled type 2 diabetes, with significant improvement in control after she started to improve diet and become compliant with her diabetic medications.  Her nadir HbA1c was 5.2%.  At last visit, this was 5.4%.  At that time, sugars were at goal with some low blood sugars overnight and midday.  She did not want to decrease the Trulicity  dose but we ended up stopping metformin .  Since last visit, we changed from Trulicity  to Ozempic . CGM interpretation: -At today's visit, we reviewed her CGM downloads: It appears that 92% of values are in target range (goal >70%), while 8% are higher than 180 (goal <25%),  and 0% are lower than 70 (goal <4%).  The calculated average blood sugar is 129.  The projected HbA1c for the next 3 months (GMI) is 6.4%. -Reviewing the CGM trends, sugars appear to have improved in the last 4 months, with the best values in the last 2 weeks, after coming off steroids.  They are mostly fluctuating within the target range with slight increases in blood sugars after meals, but usually within target.  I did not recommend a change in regimen.  Of note, she just darted Ozempic  3 weeks ago.  I refilled her prescription. - I suggested to:  Patient Instructions  Please continue: - Ozempic  0.5 mg weekly   Please return in 6 months with your sugar log.  - we checked her HbA1c: 5.9% (higher) - advised to check sugars at different times of the day - 4x a day, rotating check times - advised for yearly eye exams >> she is UTD - return to clinic in 6 months  2. HL - Latest lipid panel from 11/2023 reviewed: LDL above our target of less than 55 but much improved from baseline: Lab Results  Component Value Date   CHOL 154 12/13/2023   HDL 66.90 12/13/2023   LDLCALC 71 12/13/2023   LDLDIRECT 149.9 02/14/2010   TRIG 77.0 12/13/2023   CHOLHDL 2 12/13/2023  -She continues on Crestor  40 mg daily and Zetia  10 mg daily without side effects  3. Overweight - She initially lost 45 pounds on Trulicity  but then gained some back.  Currently on Ozempic , just darted 3 weeks ago.  She tolerates it well. - She gained 13 pounds since last visit  Lela Fendt, MD PhD [facility]

## 2024-01-21 ENCOUNTER — Other Ambulatory Visit: Payer: Self-pay

## 2024-01-21 MED ORDER — HYDROCHLOROTHIAZIDE 12.5 MG PO TABS: 12.5000 mg | ORAL_TABLET | Freq: Every day | ORAL | 0 refills | Status: DC

## 2024-01-30 ENCOUNTER — Other Ambulatory Visit: Payer: Self-pay | Admitting: Internal Medicine

## 2024-01-31 DIAGNOSIS — M48061 Spinal stenosis, lumbar region without neurogenic claudication: Secondary | ICD-10-CM | POA: Diagnosis not present

## 2024-02-05 DIAGNOSIS — M48061 Spinal stenosis, lumbar region without neurogenic claudication: Secondary | ICD-10-CM | POA: Diagnosis not present

## 2024-02-07 ENCOUNTER — Encounter: Payer: Self-pay | Admitting: Internal Medicine

## 2024-02-07 DIAGNOSIS — M48061 Spinal stenosis, lumbar region without neurogenic claudication: Secondary | ICD-10-CM | POA: Diagnosis not present

## 2024-02-07 DIAGNOSIS — N1832 Chronic kidney disease, stage 3b: Secondary | ICD-10-CM

## 2024-02-10 MED ORDER — HYDROCHLOROTHIAZIDE 25 MG PO TABS: 25.0000 mg | ORAL_TABLET | Freq: Every day | ORAL | 1 refills | Status: DC | PRN

## 2024-02-11 DIAGNOSIS — S93602A Unspecified sprain of left foot, initial encounter: Secondary | ICD-10-CM | POA: Diagnosis not present

## 2024-02-19 ENCOUNTER — Encounter: Payer: Self-pay | Admitting: Internal Medicine

## 2024-02-21 ENCOUNTER — Other Ambulatory Visit: Payer: Self-pay | Admitting: Internal Medicine

## 2024-02-26 ENCOUNTER — Other Ambulatory Visit: Payer: Self-pay | Admitting: Internal Medicine

## 2024-02-26 DIAGNOSIS — E7849 Other hyperlipidemia: Secondary | ICD-10-CM

## 2024-05-26 ENCOUNTER — Other Ambulatory Visit: Payer: Self-pay | Admitting: Internal Medicine

## 2024-05-26 DIAGNOSIS — E7849 Other hyperlipidemia: Secondary | ICD-10-CM

## 2024-06-16 ENCOUNTER — Inpatient Hospital Stay: Attending: Oncology | Admitting: Oncology

## 2024-06-16 ENCOUNTER — Inpatient Hospital Stay

## 2024-06-16 VITALS — BP 144/70 | HR 93 | Temp 98.1°F | Resp 18 | Ht 69.0 in | Wt 191.0 lb

## 2024-06-16 DIAGNOSIS — E119 Type 2 diabetes mellitus without complications: Secondary | ICD-10-CM | POA: Insufficient documentation

## 2024-06-16 DIAGNOSIS — D649 Anemia, unspecified: Secondary | ICD-10-CM | POA: Insufficient documentation

## 2024-06-16 DIAGNOSIS — M545 Low back pain, unspecified: Secondary | ICD-10-CM | POA: Diagnosis not present

## 2024-06-16 DIAGNOSIS — N289 Disorder of kidney and ureter, unspecified: Secondary | ICD-10-CM | POA: Insufficient documentation

## 2024-06-16 DIAGNOSIS — M519 Unspecified thoracic, thoracolumbar and lumbosacral intervertebral disc disorder: Secondary | ICD-10-CM | POA: Diagnosis not present

## 2024-06-16 DIAGNOSIS — D696 Thrombocytopenia, unspecified: Secondary | ICD-10-CM | POA: Insufficient documentation

## 2024-06-16 DIAGNOSIS — I10 Essential (primary) hypertension: Secondary | ICD-10-CM | POA: Insufficient documentation

## 2024-06-16 DIAGNOSIS — K760 Fatty (change of) liver, not elsewhere classified: Secondary | ICD-10-CM | POA: Diagnosis not present

## 2024-06-16 LAB — CBC WITH DIFFERENTIAL (CANCER CENTER ONLY)
Abs Immature Granulocytes: 0.01 K/uL (ref 0.00–0.07)
Basophils Absolute: 0 K/uL (ref 0.0–0.1)
Basophils Relative: 1 %
Eosinophils Absolute: 0.3 K/uL (ref 0.0–0.5)
Eosinophils Relative: 5 %
HCT: 43.4 % (ref 36.0–46.0)
Hemoglobin: 14.2 g/dL (ref 12.0–15.0)
Immature Granulocytes: 0 %
Lymphocytes Relative: 30 %
Lymphs Abs: 1.7 K/uL (ref 0.7–4.0)
MCH: 31.4 pg (ref 26.0–34.0)
MCHC: 32.7 g/dL (ref 30.0–36.0)
MCV: 96 fL (ref 80.0–100.0)
Monocytes Absolute: 0.4 K/uL (ref 0.1–1.0)
Monocytes Relative: 7 %
Neutro Abs: 3.2 K/uL (ref 1.7–7.7)
Neutrophils Relative %: 57 %
Platelet Count: 78 K/uL — ABNORMAL LOW (ref 150–400)
RBC: 4.52 MIL/uL (ref 3.87–5.11)
RDW: 12.6 % (ref 11.5–15.5)
WBC Count: 5.6 K/uL (ref 4.0–10.5)
nRBC: 0 % (ref 0.0–0.2)

## 2024-06-16 NOTE — Progress Notes (Signed)
  [facility] OFFICE PROGRESS NOTE   Diagnosis: Anemia/thrombocytopenia  INTERVAL HISTORY:   [name] returns as scheduled.  She feels well.  No fever, night sweats, or increased bruising/bleeding.  No joint swelling.  She has pain at the low back related to lumbar disc disease.  Physical therapy has helped.  Objective:  Vital signs in last 24 hours:  Blood pressure (!) 144/70, pulse 93, temperature 98.1 F (36.7 C), temperature source Temporal, resp. rate 18, height 5' 9 (1.753 m), weight 191 lb (86.6 kg), SpO2 100%.   Lymphatics: No cervical, supraclavicular, axillary, or inguinal nodes Resp: Lungs clear bilaterally Cardio: Regular rate and rhythm GI: No hepatosplenomegaly Vascular: No leg edema Musculoskeletal: Hand and wrist joints without erythema or swelling  Portacath/PICC-without erythema  Lab Results:  Lab Results  Component Value Date   WBC 5.6 06/16/2024   HGB 14.2 06/16/2024   HCT 43.4 06/16/2024   MCV 96.0 06/16/2024   PLT 78 (L) 06/16/2024   NEUTROABS 3.2 06/16/2024    CMP  Lab Results  Component Value Date   NA 140 12/13/2023   K 4.2 12/13/2023   CL 109 12/13/2023   CO2 23 12/13/2023   GLUCOSE 124 (H) 12/13/2023   BUN 26 (H) 12/13/2023   CREATININE 1.42 (H) 12/13/2023   CALCIUM  9.5 12/13/2023   PROT 7.1 12/13/2023   ALBUMIN 3.8 12/13/2023   AST 57 (H) 12/13/2023   ALT 41 (H) 12/13/2023   ALKPHOS 106 12/13/2023   BILITOT 1.3 (H) 12/13/2023   GFRNONAA 42 (L) 02/15/2023   GFRAA 85 04/22/2008    No results found for: CEA1, CEA, CAN199, CA125  Lab Results  Component Value Date   INR 1.2 (H) 08/10/2021   LABPROT 12.6 08/10/2021    Imaging:  No results found.  Medications: I have reviewed the patient's current medications.   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  The platelet count has not changed significantly over the past 3 years.  She is not anemic.  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  There is no clinical evidence of an associated condition such as a collagen vascular disease or lymphoma. She was noted to have a polyclonal increase in immunoglobulins with mild elevation of the serum free light chains in 2022.  We will ask her to return for a repeat myeloma panel and chemistry panel within the next month.  She will call for spontaneous bleeding or bruising.  She will return for an office visit and CBC in 8 months.    Natasha Hof, MD  06/16/2024  3:35 PM

## 2024-06-17 DIAGNOSIS — D631 Anemia in chronic kidney disease: Secondary | ICD-10-CM | POA: Diagnosis not present

## 2024-06-17 DIAGNOSIS — I129 Hypertensive chronic kidney disease with stage 1 through stage 4 chronic kidney disease, or unspecified chronic kidney disease: Secondary | ICD-10-CM | POA: Diagnosis not present

## 2024-06-17 DIAGNOSIS — N2581 Secondary hyperparathyroidism of renal origin: Secondary | ICD-10-CM | POA: Diagnosis not present

## 2024-06-17 DIAGNOSIS — E785 Hyperlipidemia, unspecified: Secondary | ICD-10-CM | POA: Diagnosis not present

## 2024-06-18 ENCOUNTER — Encounter: Payer: Self-pay | Admitting: Internal Medicine

## 2024-06-18 LAB — LAB REPORT - SCANNED
Albumin, Urine POC: 10.6
Creatinine, POC: 142.7 mg/dL
EGFR: 42
Microalb Creat Ratio: 7

## 2024-06-18 NOTE — Patient Instructions (Addendum)
      Blood work was ordered.       Medications changes include :   None    A referral was ordered and someone will call you to schedule an appointment.     Return in about 6 months (around 12/18/2024) for Physical Exam.

## 2024-06-18 NOTE — Progress Notes (Unsigned)
 Subjective:    Patient ID: [name], female    DOB: [birth year], 67 y.o.   MRN: [id number]     HPI Evan is here for follow up of her chronic medical problems.  Doing ok.  Mom just got out of the hospital.    Had herniated disc - saw Dr Malcolm.   Epidural did not work.  Did PT x several month.  Has better days and worse days.    Medications and allergies reviewed with patient and updated if appropriate.  Current Outpatient Medications on File Prior to Visit  Medication Sig Dispense Refill   aspirin  81 MG EC tablet Take 1 tablet (81 mg total) by mouth daily. Swallow whole. 30 tablet 12   Cholecalciferol (VITAMIN D3) 50 MCG (2000 UT) capsule Take 2,000 Units by mouth daily.     Continuous Glucose Sensor (FREESTYLE LIBRE 3 PLUS SENSOR) MISC 1 each by Does not apply route every 14 (fourteen) days. 6 each 3   ezetimibe  (ZETIA ) 10 MG tablet TAKE 1 TABLET BY MOUTH DAILY 90 tablet 0   losartan (COZAAR) 50 MG tablet Take 50 mg by mouth daily.     metoprolol  succinate (TOPROL -XL) 25 MG 24 hr tablet TAKE 1 TABLET BY MOUTH DAILY 90 tablet 3   rosuvastatin  (CRESTOR ) 40 MG tablet TAKE 1 TABLET BY MOUTH DAILY 30 tablet 2   Semaglutide ,0.25 or 0.5MG /DOS, 2 MG/3ML SOPN Inject 0.5 mg into the skin once a week. 9 mL 3   No current facility-administered medications on file prior to visit.     Review of Systems  Constitutional:  Negative for fever.  Respiratory:  Negative for cough, shortness of breath and wheezing.   Cardiovascular:  Positive for leg swelling (left ankle - intermittent). Negative for chest pain and palpitations.  Neurological:  Negative for light-headedness and headaches.       Objective:   Vitals:   06/19/24 0915  BP: 128/62  Pulse: 74  Temp: 98.4 F (36.9 C)  SpO2: 99%   BP Readings from Last 3 Encounters:  06/19/24 128/62  06/16/24 (!) 144/70  01/17/24 122/70   Wt Readings from Last 3 Encounters:  06/19/24 191 lb (86.6 kg)  06/16/24 191 lb (86.6 kg)   01/17/24 187 lb 12.8 oz (85.2 kg)   Body mass index is 28.21 kg/m.    Physical Exam Constitutional:      General: She is not in acute distress.    Appearance: Normal appearance.  HENT:     Head: Normocephalic and atraumatic.  Eyes:     Conjunctiva/sclera: Conjunctivae normal.  Cardiovascular:     Rate and Rhythm: Normal rate and regular rhythm.     Heart sounds: Normal heart sounds.  Pulmonary:     Effort: Pulmonary effort is normal. No respiratory distress.     Breath sounds: Normal breath sounds. No wheezing.  Musculoskeletal:     Cervical back: Neck supple.     Right lower leg: No edema.     Left lower leg: No edema.  Lymphadenopathy:     Cervical: No cervical adenopathy.  Skin:    General: Skin is warm and dry.     Findings: No rash.  Neurological:     Mental Status: She is alert. Mental status is at baseline.  Psychiatric:        Mood and Affect: Mood normal.        Behavior: Behavior normal.        Lab Results  Component Value Date   WBC 5.6 06/16/2024   HGB 14.2 06/16/2024   HCT 43.4 06/16/2024   PLT 78 (L) 06/16/2024   GLUCOSE 124 (H) 12/13/2023   CHOL 154 12/13/2023   TRIG 77.0 12/13/2023   HDL 66.90 12/13/2023   LDLDIRECT 149.9 02/14/2010   LDLCALC 71 12/13/2023   ALT 41 (H) 12/13/2023   AST 57 (H) 12/13/2023   NA 140 12/13/2023   K 4.2 12/13/2023   CL 109 12/13/2023   CREATININE 1.42 (H) 12/13/2023   BUN 26 (H) 12/13/2023   CO2 23 12/13/2023   TSH 1.03 12/13/2023   INR 1.2 (H) 08/10/2021   HGBA1C 5.9 (A) 01/17/2024   MICROALBUR 1.0 02/14/2010     Assessment & Plan:    See Problem List for Assessment and Plan of chronic medical problems.

## 2024-06-19 ENCOUNTER — Ambulatory Visit: Admitting: Internal Medicine

## 2024-06-19 VITALS — BP 128/62 | HR 74 | Temp 98.4°F | Wt 191.0 lb

## 2024-06-19 DIAGNOSIS — E1159 Type 2 diabetes mellitus with other circulatory complications: Secondary | ICD-10-CM | POA: Diagnosis not present

## 2024-06-19 DIAGNOSIS — I152 Hypertension secondary to endocrine disorders: Secondary | ICD-10-CM | POA: Diagnosis not present

## 2024-06-19 DIAGNOSIS — R7989 Other specified abnormal findings of blood chemistry: Secondary | ICD-10-CM

## 2024-06-19 DIAGNOSIS — I6523 Occlusion and stenosis of bilateral carotid arteries: Secondary | ICD-10-CM | POA: Diagnosis not present

## 2024-06-19 DIAGNOSIS — E1169 Type 2 diabetes mellitus with other specified complication: Secondary | ICD-10-CM | POA: Diagnosis not present

## 2024-06-19 DIAGNOSIS — D696 Thrombocytopenia, unspecified: Secondary | ICD-10-CM

## 2024-06-19 DIAGNOSIS — Z7985 Long-term (current) use of injectable non-insulin antidiabetic drugs: Secondary | ICD-10-CM | POA: Diagnosis not present

## 2024-06-19 DIAGNOSIS — N1832 Chronic kidney disease, stage 3b: Secondary | ICD-10-CM | POA: Diagnosis not present

## 2024-06-19 DIAGNOSIS — E1122 Type 2 diabetes mellitus with diabetic chronic kidney disease: Secondary | ICD-10-CM | POA: Diagnosis not present

## 2024-06-19 DIAGNOSIS — E785 Hyperlipidemia, unspecified: Secondary | ICD-10-CM | POA: Diagnosis not present

## 2024-06-19 LAB — COMPREHENSIVE METABOLIC PANEL WITH GFR
ALT: 45 U/L — ABNORMAL HIGH (ref 0–35)
AST: 76 U/L — ABNORMAL HIGH (ref 0–37)
Albumin: 3.9 g/dL (ref 3.5–5.2)
Alkaline Phosphatase: 103 U/L (ref 39–117)
BUN: 27 mg/dL — ABNORMAL HIGH (ref 6–23)
CO2: 23 meq/L (ref 19–32)
Calcium: 9.5 mg/dL (ref 8.4–10.5)
Chloride: 110 meq/L (ref 96–112)
Creatinine, Ser: 1.39 mg/dL — ABNORMAL HIGH (ref 0.40–1.20)
GFR: 38.99 mL/min — ABNORMAL LOW (ref >60.00)
Glucose, Bld: 118 mg/dL — ABNORMAL HIGH (ref 70–99)
Potassium: 4.2 meq/L (ref 3.5–5.1)
Sodium: 142 meq/L (ref 135–145)
Total Bilirubin: 1.2 mg/dL (ref 0.2–1.2)
Total Protein: 7.3 g/dL (ref 6.0–8.3)

## 2024-06-19 LAB — LIPID PANEL
Cholesterol: 221 mg/dL — ABNORMAL HIGH (ref 0–200)
HDL: 68.5 mg/dL (ref >39.00)
LDL Cholesterol: 137 mg/dL — ABNORMAL HIGH (ref 0–99)
NonHDL: 152.79
Total CHOL/HDL Ratio: 3
Triglycerides: 79 mg/dL (ref 0.0–149.0)
VLDL: 15.8 mg/dL (ref 0.0–40.0)

## 2024-06-19 LAB — VITAMIN B12: Vitamin B-12: 507 pg/mL (ref 211–911)

## 2024-06-19 LAB — VITAMIN D 25 HYDROXY (VIT D DEFICIENCY, FRACTURES): VITD: 66.77 ng/mL (ref 30.00–100.00)

## 2024-06-19 NOTE — Assessment & Plan Note (Signed)
 Chronic Stable Blood pressure well-controlled, sugars well-controlled Continue healthy diet, regular exercise and weight loss efforts Following with nephrology Continue losartan 50 mg daily

## 2024-06-19 NOTE — Assessment & Plan Note (Addendum)
 Chronic Blood pressure well controlled CMP Continue metoprolol  XL 25 mg daily, losartan 50 mg daily

## 2024-06-19 NOTE — Assessment & Plan Note (Signed)
 Chronic Stable Likely ITP Following with hematology/oncology

## 2024-06-19 NOTE — Assessment & Plan Note (Addendum)
 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 Trixie Lab Results  Component Value Date   HGBA1C 5.9 (A) 01/17/2024   Sugars well-controlled Currently on Ozempic  0.5 mg weekly Stressed regular exercise, diabetic diet

## 2024-06-19 NOTE — Assessment & Plan Note (Signed)
 Chronic Taking ASA daily now Carotid US  ordered

## 2024-06-19 NOTE — Assessment & Plan Note (Signed)
 Chronic Lab Results  Component Value Date   LDLCALC 71 12/13/2023   Well-controlled Regular exercise and healthy diet  Check lipid panel, CMP Continue Zetia  10 mg daily, rosuvastatin  40 mg daily

## 2024-06-19 NOTE — Assessment & Plan Note (Signed)
 Chronic ?Check B12 level ?

## 2024-06-21 ENCOUNTER — Ambulatory Visit: Payer: Self-pay | Admitting: Internal Medicine

## 2024-06-25 ENCOUNTER — Ambulatory Visit (HOSPITAL_COMMUNITY): Admission: RE | Admit: 2024-06-25 | Discharge: 2024-06-25 | Attending: Internal Medicine

## 2024-06-25 DIAGNOSIS — I6523 Occlusion and stenosis of bilateral carotid arteries: Secondary | ICD-10-CM | POA: Diagnosis not present

## 2024-07-16 ENCOUNTER — Other Ambulatory Visit: Payer: Self-pay | Admitting: *Deleted

## 2024-07-16 DIAGNOSIS — D696 Thrombocytopenia, unspecified: Secondary | ICD-10-CM

## 2024-07-18 ENCOUNTER — Ambulatory Visit: Admitting: Podiatry

## 2024-07-18 DIAGNOSIS — L84 Corns and callosities: Secondary | ICD-10-CM | POA: Diagnosis not present

## 2024-07-18 NOTE — Progress Notes (Signed)
 "  Subjective:  Patient ID: [name], female    DOB: [birth year],  MRN: [id number]  Chief Complaint  Patient presents with   Callouses    Patient presents today c/o corn in between 3 and 4th toe of the right foot . Patient is diabetic.    69 y.o. female presents with the above complaint.  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  She states been causing a lot of discomfort.  She is a diabetic.  She has not tried anything for it pain scale is 5 out of 10 dull aching nature hurts with ambulation and shoe pressures denies any other acute complaints.   Review of Systems: Negative except as noted in the HPI. Denies N/V/F/Ch.  Past Medical History:  Diagnosis Date   Allergy    Anxiety    Arthritis    Colitis    in her 68s; quiescent since   DM (diabetes mellitus) (HCC)    HTN (hypertension)    Hyperlipidemia    Current Medications[1]  Tobacco Use History[2]  Allergies[3] Objective:  There were no vitals filed for this visit. There is no height or weight on file to calculate BMI. Constitutional Well developed. Well nourished.  Vascular Dorsalis pedis pulses palpable bilaterally. Posterior tibial pulses palpable bilaterally. Capillary refill normal to all digits.  No cyanosis or clubbing noted. Pedal hair growth normal.  Neurologic Normal speech. Oriented to person, place, and time. Epicritic sensation to light touch grossly present bilaterally.  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  Mild hammertoe contracture of 3rd and 4th digit noted semiflexible in nature pain on palpation to the lesion  Orthopedic: Normal joint ROM without pain or crepitus bilaterally. No visible deformities. No bony tenderness.   Radiographs: None Assessment:   1. Heloma molle    Plan:  Patient was evaluated and treated and all questions answered.  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  If it continues to be bothersome we can surgically remove the lesion.  She states understanding  No follow-ups on file.    [1]  Current Outpatient Medications:    aspirin  81 MG EC tablet, Take 1 tablet (81 mg total) by mouth daily. Swallow whole., Disp: 30 tablet, Rfl: 12   Cholecalciferol (VITAMIN D3) 50 MCG (2000 UT) capsule, Take 2,000 Units by mouth daily., Disp: , Rfl:    Continuous Glucose Sensor (FREESTYLE LIBRE 3 PLUS SENSOR) MISC, 1 each by Does not apply route every 14 (fourteen) days., Disp: 6 each, Rfl: 3   ezetimibe  (ZETIA ) 10 MG tablet, TAKE 1 TABLET BY MOUTH DAILY, Disp: 90 tablet, Rfl: 0   losartan (COZAAR) 50 MG tablet, Take 50 mg by mouth daily., Disp: , Rfl:    metoprolol  succinate (TOPROL -XL) 25 MG 24 hr tablet, TAKE 1 TABLET BY MOUTH DAILY, Disp: 90 tablet, Rfl: 3   rosuvastatin  (CRESTOR ) 40 MG tablet, TAKE 1 TABLET BY MOUTH DAILY, Disp: 30 tablet, Rfl: 2   Semaglutide ,0.25 or 0.5MG /DOS, 2 MG/3ML SOPN, Inject 0.5 mg into the skin once a week., Disp: 9 mL, Rfl: 3 [2]  Social History Tobacco Use  Smoking Status Former   Current packs/day: 0.00   Types: Cigarettes   Quit  date: 07/17/1977   Years since quitting: 47.0  Smokeless Tobacco Never  Tobacco Comments   Smoked in College Only ; [phone], up to 1/2 ppd  [3]  Allergies Allergen Reactions   Atorvastatin      REACTION: ELEVATES LFT'S. Pt states she is currently taking Atorvastatin  40 mg (1/2 tablet daily) and does not recall hab=ving any issues with it. (07/14/14)   Tramadol  Nausea And Vomiting   Codeine Nausea Only        Neomycin-Bacitracin Zn-Polymyx Rash   Onglyza [Saxagliptin  Hydrochloride] Rash    This occurred after starting Kombiglyze . This was present as a rash over the hands and  feet dorsally. She has been able to take metformin  in the past as well as Janumet  without problems.   Sulfamethoxazole-Trimethoprim Nausea And Vomiting     Nausea was extreme.     "

## 2024-07-23 ENCOUNTER — Ambulatory Visit: Admitting: Internal Medicine

## 2024-07-24 ENCOUNTER — Inpatient Hospital Stay: Payer: Self-pay | Attending: Oncology

## 2024-07-24 DIAGNOSIS — I10 Essential (primary) hypertension: Secondary | ICD-10-CM | POA: Insufficient documentation

## 2024-07-24 DIAGNOSIS — N289 Disorder of kidney and ureter, unspecified: Secondary | ICD-10-CM | POA: Insufficient documentation

## 2024-07-24 DIAGNOSIS — M545 Low back pain, unspecified: Secondary | ICD-10-CM | POA: Diagnosis not present

## 2024-07-24 DIAGNOSIS — M519 Unspecified thoracic, thoracolumbar and lumbosacral intervertebral disc disorder: Secondary | ICD-10-CM | POA: Diagnosis not present

## 2024-07-24 DIAGNOSIS — D696 Thrombocytopenia, unspecified: Secondary | ICD-10-CM | POA: Insufficient documentation

## 2024-07-24 DIAGNOSIS — E119 Type 2 diabetes mellitus without complications: Secondary | ICD-10-CM | POA: Insufficient documentation

## 2024-07-24 DIAGNOSIS — D649 Anemia, unspecified: Secondary | ICD-10-CM | POA: Insufficient documentation

## 2024-07-24 DIAGNOSIS — K760 Fatty (change of) liver, not elsewhere classified: Secondary | ICD-10-CM | POA: Insufficient documentation

## 2024-07-24 LAB — CMP (CANCER CENTER ONLY)
ALT: 49 U/L — ABNORMAL HIGH (ref 0–44)
AST: 73 U/L — ABNORMAL HIGH (ref 15–41)
Albumin: 4 g/dL (ref 3.5–5.0)
Alkaline Phosphatase: 117 U/L (ref 38–126)
Anion gap: 12 (ref 5–15)
BUN: 32 mg/dL — ABNORMAL HIGH (ref 8–23)
CO2: 20 mmol/L — ABNORMAL LOW (ref 22–32)
Calcium: 10.1 mg/dL (ref 8.9–10.3)
Chloride: 107 mmol/L (ref 98–111)
Creatinine: 1.52 mg/dL — ABNORMAL HIGH (ref 0.44–1.00)
GFR, Estimated: 37 mL/min — ABNORMAL LOW
Glucose, Bld: 127 mg/dL — ABNORMAL HIGH (ref 70–99)
Potassium: 4.8 mmol/L (ref 3.5–5.1)
Sodium: 139 mmol/L (ref 135–145)
Total Bilirubin: 1.3 mg/dL — ABNORMAL HIGH (ref 0.0–1.2)
Total Protein: 7.8 g/dL (ref 6.5–8.1)

## 2024-07-25 LAB — KAPPA/LAMBDA LIGHT CHAINS
Kappa free light chain: 52.6 mg/L — ABNORMAL HIGH (ref 3.3–19.4)
Kappa, lambda light chain ratio: 1.2 (ref 0.26–1.65)
Lambda free light chains: 43.8 mg/L — ABNORMAL HIGH (ref 5.7–26.3)

## 2024-07-28 LAB — MULTIPLE MYELOMA PANEL, SERUM
Albumin SerPl Elph-Mcnc: 3.5 g/dL (ref 2.9–4.4)
Albumin/Glob SerPl: 1.1 (ref 0.7–1.7)
Alpha 1: 0.2 g/dL (ref 0.0–0.4)
Alpha2 Glob SerPl Elph-Mcnc: 0.6 g/dL (ref 0.4–1.0)
B-Globulin SerPl Elph-Mcnc: 1.2 g/dL (ref 0.7–1.3)
Gamma Glob SerPl Elph-Mcnc: 1.5 g/dL (ref 0.4–1.8)
Globulin, Total: 3.5 g/dL (ref 2.2–3.9)
IgA: 583 mg/dL — ABNORMAL HIGH (ref 87–352)
IgG (Immunoglobin G), Serum: 1470 mg/dL (ref 586–1602)
IgM (Immunoglobulin M), Srm: 145 mg/dL (ref 26–217)
Total Protein ELP: 7 g/dL (ref 6.0–8.5)

## 2024-07-29 ENCOUNTER — Ambulatory Visit: Payer: Self-pay | Admitting: Oncology

## 2024-07-29 ENCOUNTER — Other Ambulatory Visit: Payer: Self-pay | Admitting: Internal Medicine

## 2024-07-29 NOTE — Telephone Encounter (Signed)
-----   Message from Arley Hof, MD sent at 07/29/2024  2:02 PM EST ----- Please call patient, she has persistent renal insufficiency and elevated liver enzymes, no abnormal protein detected to suggest multiple myeloma, follow-up as scheduled ----- Message ----- From: Debby Olam POUR, NP Sent: 07/29/2024   8:18 AM EST To: Arley KATHEE Hof, MD  Did you see her?

## 2024-08-20 ENCOUNTER — Other Ambulatory Visit: Payer: Self-pay | Admitting: Internal Medicine

## 2024-08-20 DIAGNOSIS — E7849 Other hyperlipidemia: Secondary | ICD-10-CM

## 2024-09-03 ENCOUNTER — Ambulatory Visit: Admitting: Internal Medicine

## 2024-12-17 ENCOUNTER — Encounter: Admitting: Internal Medicine

## 2025-02-16 ENCOUNTER — Inpatient Hospital Stay: Admitting: Oncology

## 2025-02-16 ENCOUNTER — Inpatient Hospital Stay
